# Patient Record
Sex: Male | Born: 1940
Health system: Southern US, Community
[De-identification: ages and names within clinical notes are randomized; demographics above are authoritative.]

## PROBLEM LIST (undated history)

## (undated) DIAGNOSIS — C801 Malignant (primary) neoplasm, unspecified: Secondary | ICD-10-CM

## (undated) DIAGNOSIS — Z87442 Personal history of urinary calculi: Secondary | ICD-10-CM

## (undated) DIAGNOSIS — G620 Drug-induced polyneuropathy: Secondary | ICD-10-CM

## (undated) DIAGNOSIS — R7303 Prediabetes: Secondary | ICD-10-CM

## (undated) DIAGNOSIS — I10 Essential (primary) hypertension: Secondary | ICD-10-CM

## (undated) DIAGNOSIS — T451X5A Adverse effect of antineoplastic and immunosuppressive drugs, initial encounter: Secondary | ICD-10-CM

## (undated) HISTORY — PX: HEMICOLECTOMY: SHX854

## (undated) HISTORY — PX: COLONOSCOPY: SHX174

## (undated) HISTORY — DX: Adverse effect of antineoplastic and immunosuppressive drugs, initial encounter: G62.0

## (undated) HISTORY — DX: Essential (primary) hypertension: I10

## (undated) HISTORY — DX: Drug-induced polyneuropathy: T45.1X5A

## (undated) HISTORY — PX: CATARACT EXTRACTION, BILATERAL: SHX1313

---

## 2002-05-06 HISTORY — PX: COLON SURGERY: SHX602

## 2002-05-13 ENCOUNTER — Encounter: Payer: Self-pay | Admitting: Family Medicine

## 2002-05-13 ENCOUNTER — Ambulatory Visit (HOSPITAL_COMMUNITY): Admission: RE | Admit: 2002-05-13 | Discharge: 2002-05-13 | Payer: Self-pay | Admitting: Family Medicine

## 2003-01-20 ENCOUNTER — Encounter: Payer: Self-pay | Admitting: *Deleted

## 2003-01-20 ENCOUNTER — Encounter: Admission: RE | Admit: 2003-01-20 | Discharge: 2003-01-20 | Payer: Self-pay | Admitting: *Deleted

## 2003-01-24 ENCOUNTER — Encounter: Admission: RE | Admit: 2003-01-24 | Discharge: 2003-01-30 | Payer: Self-pay | Admitting: General Surgery

## 2003-01-24 ENCOUNTER — Encounter: Payer: Self-pay | Admitting: General Surgery

## 2003-01-26 ENCOUNTER — Encounter (INDEPENDENT_AMBULATORY_CARE_PROVIDER_SITE_OTHER): Payer: Self-pay | Admitting: Specialist

## 2003-01-26 ENCOUNTER — Inpatient Hospital Stay (HOSPITAL_COMMUNITY): Admission: RE | Admit: 2003-01-26 | Discharge: 2003-01-29 | Payer: Self-pay | Admitting: General Surgery

## 2003-02-22 ENCOUNTER — Encounter: Payer: Self-pay | Admitting: Surgery

## 2003-02-22 ENCOUNTER — Ambulatory Visit (HOSPITAL_BASED_OUTPATIENT_CLINIC_OR_DEPARTMENT_OTHER): Admission: RE | Admit: 2003-02-22 | Discharge: 2003-02-22 | Payer: Self-pay | Admitting: Surgery

## 2003-02-24 ENCOUNTER — Ambulatory Visit (HOSPITAL_COMMUNITY): Admission: RE | Admit: 2003-02-24 | Discharge: 2003-02-24 | Payer: Self-pay | Admitting: Hematology & Oncology

## 2003-02-24 ENCOUNTER — Encounter: Payer: Self-pay | Admitting: Hematology & Oncology

## 2003-03-22 ENCOUNTER — Ambulatory Visit (HOSPITAL_COMMUNITY): Admission: RE | Admit: 2003-03-22 | Discharge: 2003-03-22 | Payer: Self-pay | Admitting: Hematology & Oncology

## 2003-05-07 HISTORY — PX: HERNIA REPAIR: SHX51

## 2003-06-13 ENCOUNTER — Ambulatory Visit: Admission: RE | Admit: 2003-06-13 | Discharge: 2003-06-13 | Payer: Self-pay | Admitting: Hematology & Oncology

## 2003-07-22 ENCOUNTER — Ambulatory Visit: Admission: RE | Admit: 2003-07-22 | Discharge: 2003-07-22 | Payer: Self-pay | Admitting: Hematology & Oncology

## 2003-08-23 ENCOUNTER — Ambulatory Visit (HOSPITAL_COMMUNITY): Admission: RE | Admit: 2003-08-23 | Discharge: 2003-08-23 | Payer: Self-pay | Admitting: Hematology & Oncology

## 2003-09-12 ENCOUNTER — Ambulatory Visit (HOSPITAL_BASED_OUTPATIENT_CLINIC_OR_DEPARTMENT_OTHER): Admission: RE | Admit: 2003-09-12 | Discharge: 2003-09-12 | Payer: Self-pay | Admitting: General Surgery

## 2003-11-15 ENCOUNTER — Ambulatory Visit (HOSPITAL_COMMUNITY): Admission: RE | Admit: 2003-11-15 | Discharge: 2003-11-15 | Payer: Self-pay | Admitting: Hematology & Oncology

## 2003-12-14 ENCOUNTER — Observation Stay (HOSPITAL_COMMUNITY): Admission: RE | Admit: 2003-12-14 | Discharge: 2003-12-15 | Payer: Self-pay | Admitting: General Surgery

## 2004-02-02 ENCOUNTER — Ambulatory Visit (HOSPITAL_COMMUNITY): Admission: RE | Admit: 2004-02-02 | Discharge: 2004-02-02 | Payer: Self-pay | Admitting: Hematology & Oncology

## 2004-03-14 ENCOUNTER — Ambulatory Visit: Payer: Self-pay | Admitting: Hematology & Oncology

## 2004-05-01 ENCOUNTER — Ambulatory Visit (HOSPITAL_COMMUNITY): Admission: RE | Admit: 2004-05-01 | Discharge: 2004-05-01 | Payer: Self-pay | Admitting: Hematology & Oncology

## 2004-05-16 ENCOUNTER — Ambulatory Visit: Payer: Self-pay | Admitting: Hematology & Oncology

## 2004-08-31 ENCOUNTER — Ambulatory Visit: Payer: Self-pay | Admitting: Hematology & Oncology

## 2004-09-05 ENCOUNTER — Ambulatory Visit (HOSPITAL_COMMUNITY): Admission: RE | Admit: 2004-09-05 | Discharge: 2004-09-05 | Payer: Self-pay | Admitting: Hematology & Oncology

## 2004-12-31 ENCOUNTER — Ambulatory Visit: Payer: Self-pay | Admitting: Hematology & Oncology

## 2005-01-04 ENCOUNTER — Ambulatory Visit (HOSPITAL_COMMUNITY): Admission: RE | Admit: 2005-01-04 | Discharge: 2005-01-04 | Payer: Self-pay | Admitting: Hematology & Oncology

## 2005-05-07 ENCOUNTER — Ambulatory Visit: Payer: Self-pay | Admitting: Hematology & Oncology

## 2005-05-10 ENCOUNTER — Ambulatory Visit (HOSPITAL_COMMUNITY): Admission: RE | Admit: 2005-05-10 | Discharge: 2005-05-10 | Payer: Self-pay | Admitting: Hematology & Oncology

## 2005-11-01 ENCOUNTER — Ambulatory Visit: Payer: Self-pay | Admitting: Hematology & Oncology

## 2005-11-11 ENCOUNTER — Ambulatory Visit (HOSPITAL_COMMUNITY): Admission: RE | Admit: 2005-11-11 | Discharge: 2005-11-11 | Payer: Self-pay | Admitting: Hematology & Oncology

## 2005-11-11 LAB — CBC WITH DIFFERENTIAL/PLATELET
HCT: 44.1 % (ref 38.7–49.9)
LYMPH%: 29.1 % (ref 14.0–48.0)
MCH: 30.7 pg (ref 28.0–33.4)
NEUT#: 3.4 10*3/uL (ref 1.5–6.5)
Platelets: 298 10*3/uL (ref 145–400)
RBC: 5.01 10*6/uL (ref 4.20–5.71)

## 2005-11-11 LAB — COMPREHENSIVE METABOLIC PANEL
ALT: 32 U/L (ref 0–40)
AST: 29 U/L (ref 0–37)
Albumin: 4.1 g/dL (ref 3.5–5.2)
Alkaline Phosphatase: 72 U/L (ref 39–117)
Potassium: 4 mEq/L (ref 3.5–5.3)
Total Bilirubin: 0.9 mg/dL (ref 0.3–1.2)

## 2005-11-11 LAB — CEA: CEA: 1.4 ng/mL (ref 0.0–5.0)

## 2006-04-24 ENCOUNTER — Ambulatory Visit: Payer: Self-pay | Admitting: Hematology & Oncology

## 2006-05-08 ENCOUNTER — Ambulatory Visit (HOSPITAL_COMMUNITY): Admission: RE | Admit: 2006-05-08 | Discharge: 2006-05-08 | Payer: Self-pay | Admitting: Hematology & Oncology

## 2006-05-08 LAB — COMPREHENSIVE METABOLIC PANEL
AST: 31 U/L (ref 0–37)
Calcium: 9.6 mg/dL (ref 8.4–10.5)
Creatinine, Ser: 0.92 mg/dL (ref 0.40–1.50)
Potassium: 3.9 mEq/L (ref 3.5–5.3)
Sodium: 140 mEq/L (ref 135–145)
Total Bilirubin: 0.8 mg/dL (ref 0.3–1.2)

## 2006-05-08 LAB — CBC WITH DIFFERENTIAL/PLATELET
Basophils Absolute: 0 10*3/uL (ref 0.0–0.1)
HCT: 45.1 % (ref 38.7–49.9)
HGB: 15.3 g/dL (ref 13.0–17.1)
LYMPH%: 35.3 % (ref 14.0–48.0)
MCH: 30.3 pg (ref 28.0–33.4)
MONO%: 8.5 % (ref 0.0–13.0)
NEUT%: 53.3 % (ref 40.0–75.0)
Platelets: 329 10*3/uL (ref 145–400)
RBC: 5.05 10*6/uL (ref 4.20–5.71)
RDW: 12.4 % (ref 11.2–14.6)
WBC: 7.3 10*3/uL (ref 4.0–10.0)

## 2006-05-08 LAB — PSA: PSA: 0.79 ng/mL (ref 0.10–4.00)

## 2006-05-08 IMAGING — CT CT ABDOMEN W/ CM
2 of 6 series · 17 of 46 positions shown, 19 images · IV contrast (omnipaque)
Comparison: [DATE].

CLINICAL DATA: Colon cancer.
 CHEST CT WITH CONTRAST:
TECHNIQUE: Multidetector CT imaging of the chest was performed following the standard protocol during bolus administration of intravenous contrast.
 Contrast:  125 cc Omnipaque 300.
TECHNIQUE: Multidetector CT imaging of the abdomen was performed following the standard protocol during bolus administration of intravenous contrast.
TECHNIQUE: Multidetector CT imaging of the pelvis was performed following the standard protocol during bolus administration of intravenous contrast.

[Series 2: cap 5.0 b40f · axial · 0.80mm/px · z∈[-667,-92]mm · 14 of 133 slices shown, 16 images]
[im 9/133  soft-tissue]
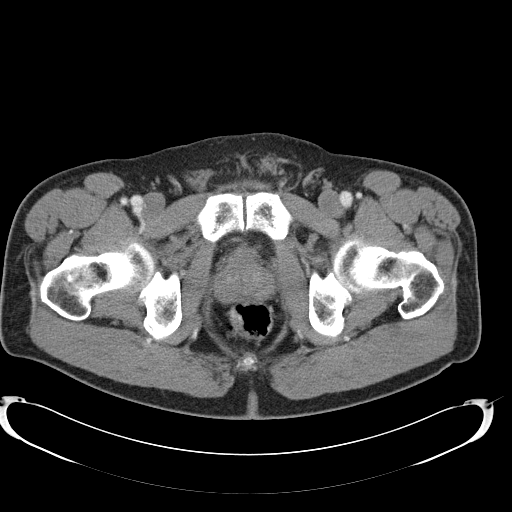
[im 9/133  bone]
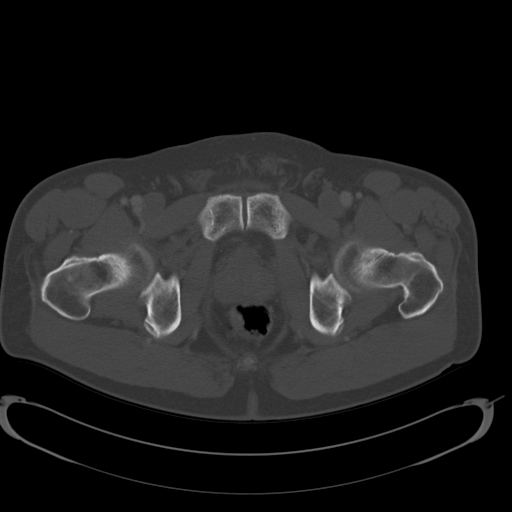
[im 17/133  soft-tissue]
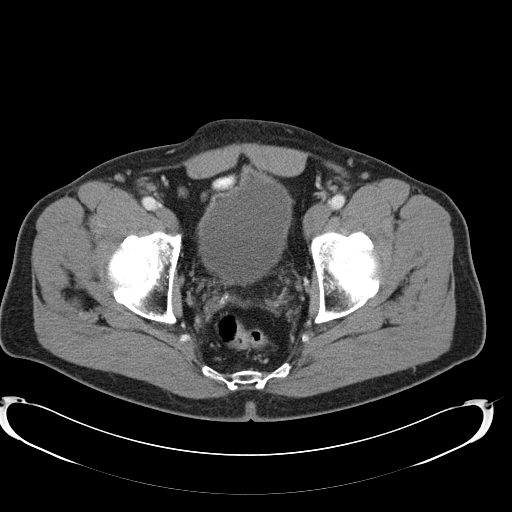
[im 25/133  soft-tissue]
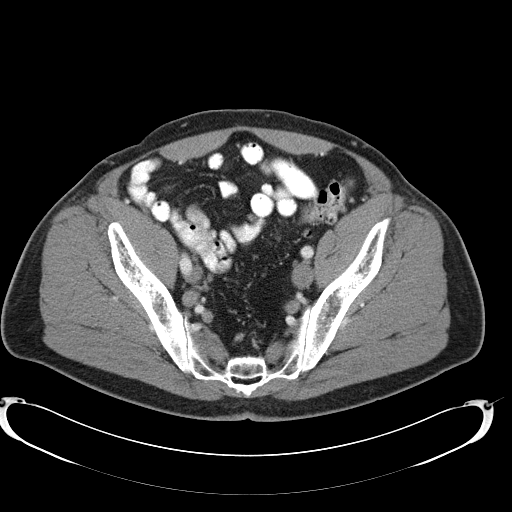
[im 34/133  soft-tissue]
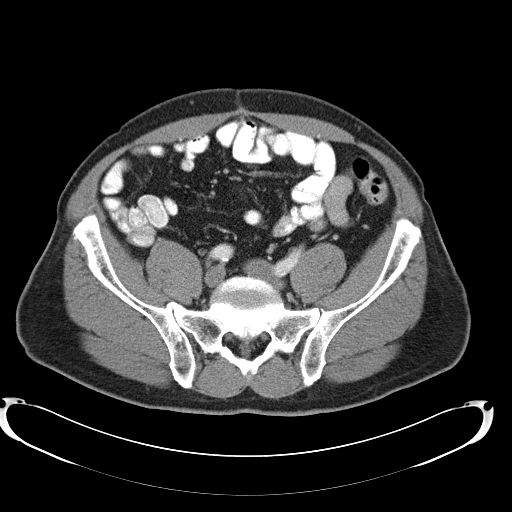
[im 42/133  soft-tissue]
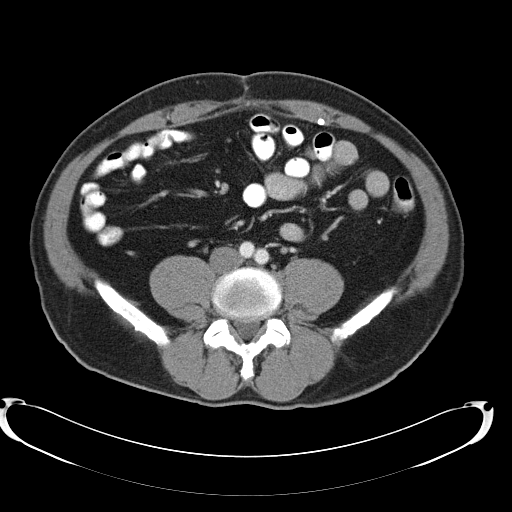
[im 50/133  soft-tissue]
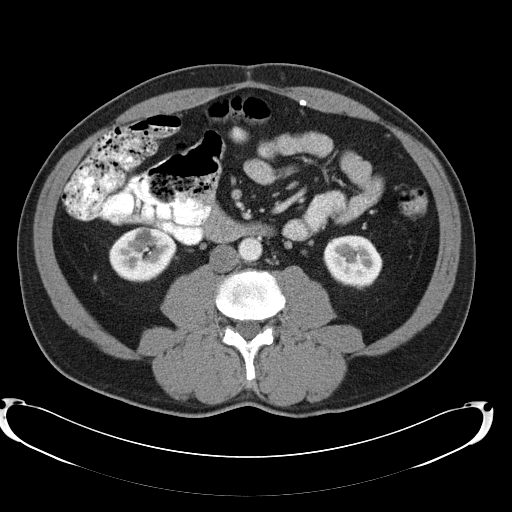
[im 58/133  soft-tissue]
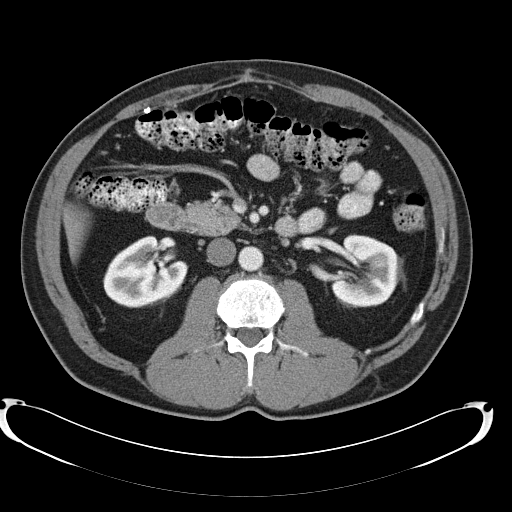
[im 75/133  soft-tissue]
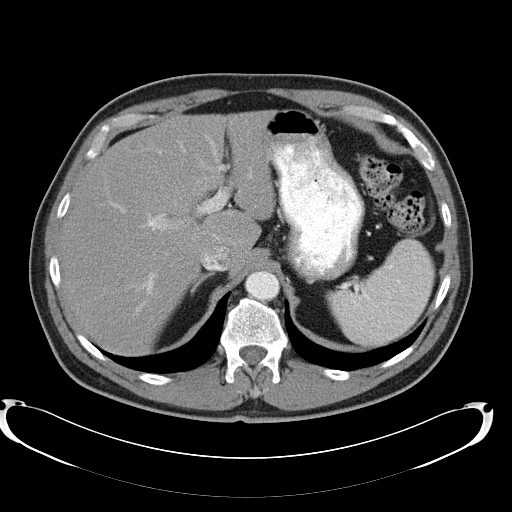
[im 83/133  soft-tissue]
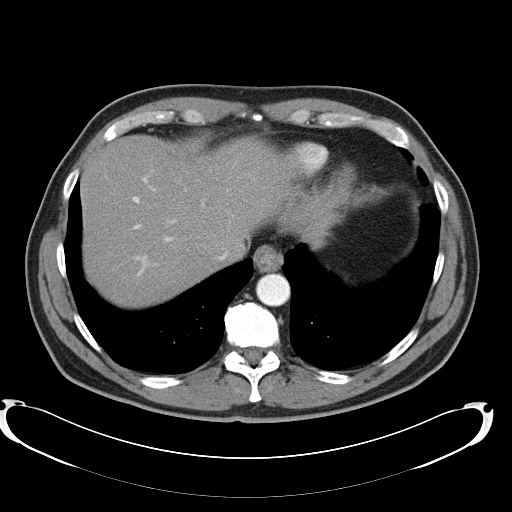
[im 83/133  bone]
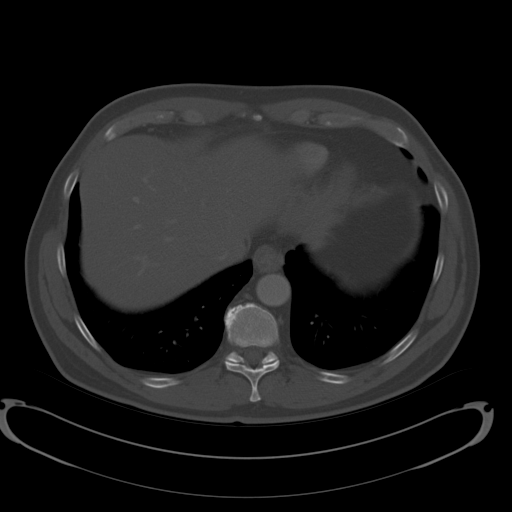
[im 91/133  soft-tissue]
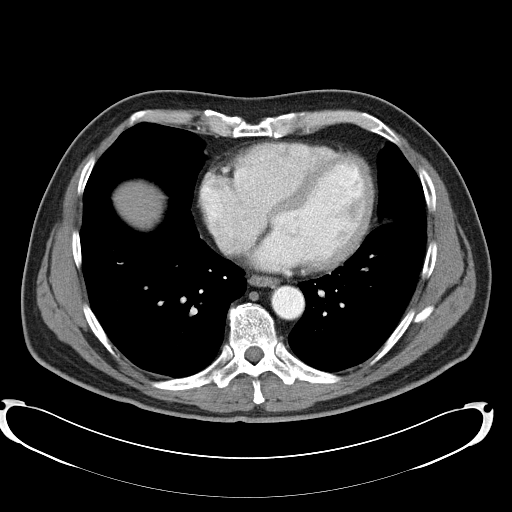
[im 100/133  soft-tissue]
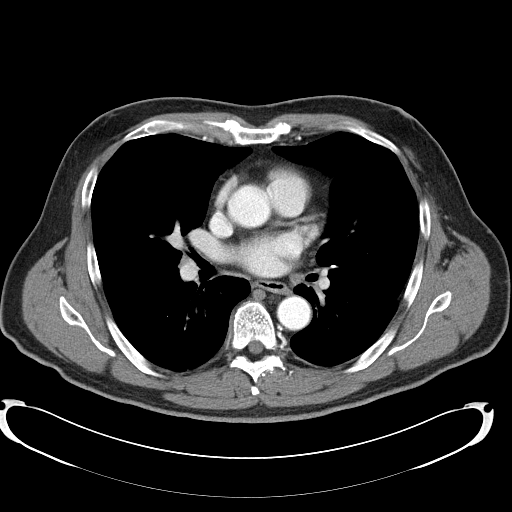
[im 108/133  soft-tissue]
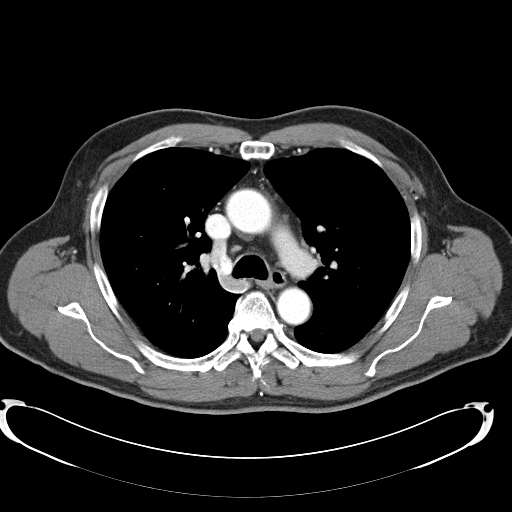
[im 116/133  soft-tissue]
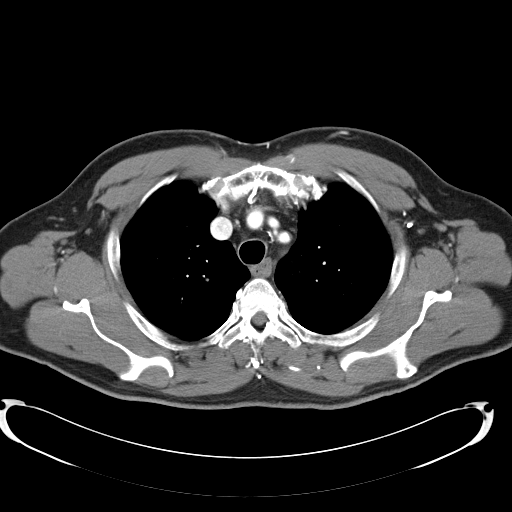
[im 124/133  soft-tissue]
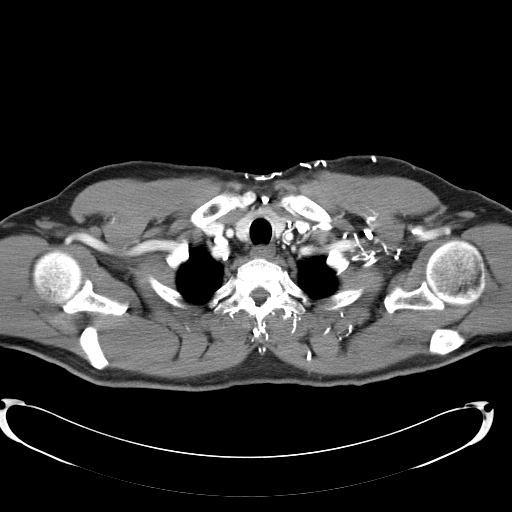

[Series 602: coronal · coronal · 1.30mm/px · 3 of 85 slices shown]
[im 29/85  soft-tissue]
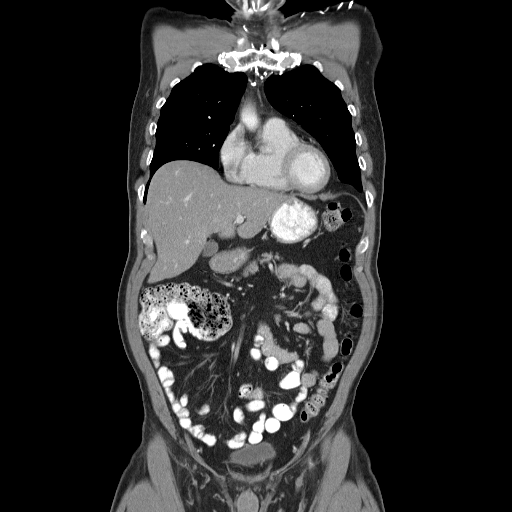
[im 38/85  soft-tissue]
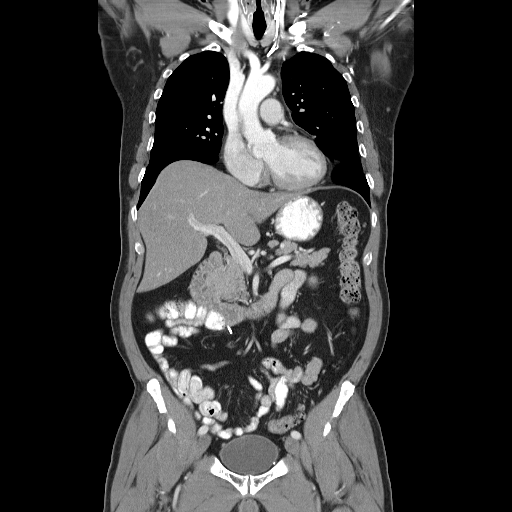
[im 47/85  soft-tissue]
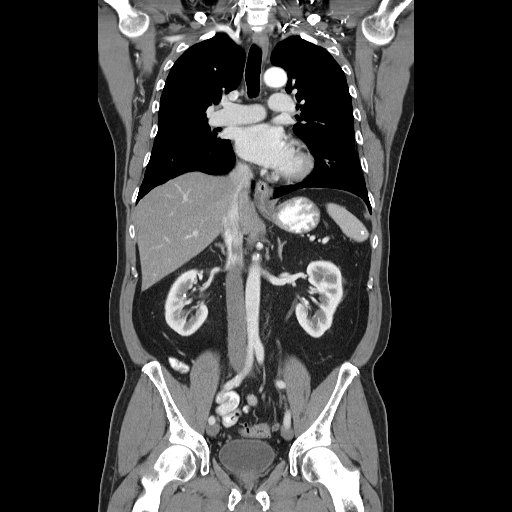

[17 of 46 positions shown; findings below may reference images not displayed]

FINDINGS: The left innominate vein is chronically occluded.  Negative abnormal mediastinal adenopathy.  Small hilar nodes are unchanged.  Negative pericardial effusion.
 No pneumothoraces or effusions are seen. 
 A 5 mm right middle lobe nodule on image #31 is stable.  No new pulmonary nodules are seen.
IMPRESSION: Stable exam.  Stable right middle lobe nodule.  Stable left innominate vein occlusion.
 ABDOMEN CT WITH CONTRAST:
FINDINGS: Diffuse fatty infiltration of the liver is stable.  Calcified granulomata in the spleen are stable.  The gallbladder, adrenal glands and pancreas are unremarkable.  Nephrolithiasis is stable.  Renal hypodensities are stable.  Negative free fluid or abnormal adenopathy.  Postoperative changes are noted.  There is an apparent significant stenosis at the origin of the superior mesenteric artery.  This is a stable finding.
IMPRESSION: No evidence of metastatic disease.  Stable exam.  Stenosis at the origin of the superior mesenteric artery is suggested.
 PELVIS CT WITH CONTRAST:
FINDINGS: Sigmoid diverticulosis without diverticulitis is stable.  Negative free fluid or abnormal adenopathy.  The bladder and prostate are within normal limits.
IMPRESSION: No evidence of metastatic disease.  Stable diverticulosis of the sigmoid colon.

## 2006-09-11 ENCOUNTER — Ambulatory Visit: Admission: RE | Admit: 2006-09-11 | Discharge: 2006-09-11 | Payer: Self-pay | Admitting: Hematology & Oncology

## 2006-09-11 ENCOUNTER — Encounter: Payer: Self-pay | Admitting: Hematology & Oncology

## 2006-11-12 ENCOUNTER — Ambulatory Visit: Payer: Self-pay | Admitting: Hematology & Oncology

## 2006-11-14 ENCOUNTER — Ambulatory Visit (HOSPITAL_COMMUNITY): Admission: RE | Admit: 2006-11-14 | Discharge: 2006-11-14 | Payer: Self-pay | Admitting: Hematology & Oncology

## 2006-11-14 LAB — CBC WITH DIFFERENTIAL/PLATELET
BASO%: 1.5 % (ref 0.0–2.0)
Basophils Absolute: 0.1 10*3/uL (ref 0.0–0.1)
Eosinophils Absolute: 0.2 10*3/uL (ref 0.0–0.5)
HCT: 42.5 % (ref 38.7–49.9)
MCH: 30.8 pg (ref 28.0–33.4)
MCHC: 35.9 g/dL (ref 32.0–35.9)
MCV: 85.7 fL (ref 81.6–98.0)
MONO#: 0.7 10*3/uL (ref 0.1–0.9)
MONO%: 10.9 % (ref 0.0–13.0)
NEUT#: 3.6 10*3/uL (ref 1.5–6.5)
Platelets: 321 10*3/uL (ref 145–400)
RBC: 4.96 10*6/uL (ref 4.20–5.71)
RDW: 10.5 % — ABNORMAL LOW (ref 11.2–14.6)
lymph#: 2.1 10*3/uL (ref 0.9–3.3)

## 2006-11-14 LAB — COMPREHENSIVE METABOLIC PANEL
AST: 31 U/L (ref 0–37)
Alkaline Phosphatase: 71 U/L (ref 39–117)
BUN: 16 mg/dL (ref 6–23)
CO2: 29 mEq/L (ref 19–32)
Creatinine, Ser: 1.02 mg/dL (ref 0.40–1.50)
Glucose, Bld: 116 mg/dL — ABNORMAL HIGH (ref 70–99)
Sodium: 140 mEq/L (ref 135–145)
Total Bilirubin: 0.7 mg/dL (ref 0.3–1.2)

## 2006-11-14 LAB — CEA: CEA: 1.7 ng/mL (ref 0.0–5.0)

## 2006-12-28 ENCOUNTER — Ambulatory Visit: Payer: Self-pay | Admitting: Hematology & Oncology

## 2007-05-19 ENCOUNTER — Ambulatory Visit: Payer: Self-pay | Admitting: Hematology & Oncology

## 2007-05-21 ENCOUNTER — Ambulatory Visit (HOSPITAL_COMMUNITY): Admission: RE | Admit: 2007-05-21 | Discharge: 2007-05-21 | Payer: Self-pay | Admitting: Hematology & Oncology

## 2007-05-21 LAB — COMPREHENSIVE METABOLIC PANEL
ALT: 27 U/L (ref 0–53)
AST: 26 U/L (ref 0–37)
Albumin: 4 g/dL (ref 3.5–5.2)
Alkaline Phosphatase: 71 U/L (ref 39–117)
BUN: 19 mg/dL (ref 6–23)
CO2: 30 mEq/L (ref 19–32)
Calcium: 9.3 mg/dL (ref 8.4–10.5)
Chloride: 101 mEq/L (ref 96–112)
Creatinine, Ser: 0.94 mg/dL (ref 0.40–1.50)
Glucose, Bld: 112 mg/dL — ABNORMAL HIGH (ref 70–99)
Potassium: 4 mEq/L (ref 3.5–5.3)
Sodium: 137 mEq/L (ref 135–145)
Total Bilirubin: 0.9 mg/dL (ref 0.3–1.2)
Total Protein: 6.5 g/dL (ref 6.0–8.3)

## 2007-05-21 LAB — CBC WITH DIFFERENTIAL/PLATELET
BASO%: 0.5 % (ref 0.0–2.0)
Basophils Absolute: 0 10*3/uL (ref 0.0–0.1)
EOS%: 2.8 % (ref 0.0–7.0)
Eosinophils Absolute: 0.2 10*3/uL (ref 0.0–0.5)
HCT: 44.3 % (ref 38.7–49.9)
HGB: 15.7 g/dL (ref 13.0–17.1)
LYMPH%: 30.2 % (ref 14.0–48.0)
MCH: 31.6 pg (ref 28.0–33.4)
MCHC: 35.6 g/dL (ref 32.0–35.9)
MCV: 88.9 fL (ref 81.6–98.0)
MONO#: 0.8 10*3/uL (ref 0.1–0.9)
MONO%: 12 % (ref 0.0–13.0)
NEUT#: 3.6 10*3/uL (ref 1.5–6.5)
NEUT%: 54.5 % (ref 40.0–75.0)
Platelets: 304 10*3/uL (ref 145–400)
RBC: 4.98 10*6/uL (ref 4.20–5.71)
RDW: 12.3 % (ref 11.2–14.6)
WBC: 6.7 10*3/uL (ref 4.0–10.0)
lymph#: 2 10*3/uL (ref 0.9–3.3)

## 2007-05-21 LAB — CEA: CEA: 1 ng/mL (ref 0.0–5.0)

## 2007-11-17 ENCOUNTER — Ambulatory Visit: Payer: Self-pay | Admitting: Hematology & Oncology

## 2007-11-19 ENCOUNTER — Ambulatory Visit (HOSPITAL_COMMUNITY): Admission: RE | Admit: 2007-11-19 | Discharge: 2007-11-19 | Payer: Self-pay | Admitting: Hematology & Oncology

## 2007-11-19 LAB — COMPREHENSIVE METABOLIC PANEL
ALT: 24 U/L (ref 0–53)
Albumin: 3.8 g/dL (ref 3.5–5.2)
BUN: 10 mg/dL (ref 6–23)
Calcium: 9.3 mg/dL (ref 8.4–10.5)
Glucose, Bld: 113 mg/dL — ABNORMAL HIGH (ref 70–99)
Potassium: 3.8 mEq/L (ref 3.5–5.3)
Sodium: 138 mEq/L (ref 135–145)
Total Bilirubin: 0.8 mg/dL (ref 0.3–1.2)
Total Protein: 6.9 g/dL (ref 6.0–8.3)

## 2007-11-19 LAB — CBC WITH DIFFERENTIAL/PLATELET
Basophils Absolute: 0 10*3/uL (ref 0.0–0.1)
EOS%: 4 % (ref 0.0–7.0)
Eosinophils Absolute: 0.3 10*3/uL (ref 0.0–0.5)
HGB: 14.8 g/dL (ref 13.0–17.1)
LYMPH%: 31.2 % (ref 14.0–48.0)
MCH: 30.8 pg (ref 28.0–33.4)
MONO%: 10 % (ref 0.0–13.0)
NEUT#: 3.6 10*3/uL (ref 1.5–6.5)
NEUT%: 54.1 % (ref 40.0–75.0)
Platelets: 306 10*3/uL (ref 145–400)
RBC: 4.81 10*6/uL (ref 4.20–5.71)
RDW: 12.1 % (ref 11.2–14.6)
WBC: 6.6 10*3/uL (ref 4.0–10.0)
lymph#: 2.1 10*3/uL (ref 0.9–3.3)

## 2007-11-19 LAB — CEA: CEA: 1.9 ng/mL (ref 0.0–5.0)

## 2007-11-26 ENCOUNTER — Ambulatory Visit: Payer: Self-pay | Admitting: Hematology & Oncology

## 2010-05-26 ENCOUNTER — Encounter: Payer: Self-pay | Admitting: Hematology & Oncology

## 2010-05-27 ENCOUNTER — Encounter: Payer: Self-pay | Admitting: Hematology & Oncology

## 2010-09-21 NOTE — Discharge Summary (Signed)
NAME:  Jared [name], Jared [name]                            ACCOUNT NO.:  [id number]   MEDICAL RECORD NO.:  [id number]                   PATIENT TYPE:  INP   LOCATION:  0446                                 FACILITY:  [facility]   PHYSICIAN:  Sharlet Salina T. Hoxworth, M.D.          DATE OF BIRTH:  1941/03/09   DATE OF ADMISSION:  01/26/2003  DATE OF DISCHARGE:  01/29/2003                                 DISCHARGE SUMMARY   DISCHARGE DIAGNOSIS:  Adenocarcinoma of the right colon.   PROCEDURE:  Right hemicolectomy January 26, 2003.   HISTORY OF PRESENT ILLNESS:  [name] is a 70 year old white male who  presented with anemia.  Colonoscopy has revealed a typical appearing  carcinoma in the right colon.  Preoperative work-up does not show any  evidence of metastatic disease.  Following a mechanical antibiotic bowel  prep at home, he is admitted for elective colectomy.   PAST MEDICAL HISTORY:  Generally healthy.  He has a history of kidney stone.   PAST SURGICAL HISTORY:  The only surgery is tonsillectomy.   MEDICATIONS:  He takes Protonix for mild GERD.   ALLERGIES:  No known drug allergies.   For social history, family history, and review of systems see detailed H&P.   PERTINENT PHYSICAL EXAMINATION:  A generally healthy-appearing white male in  no distress.  Physical examination was entirely unremarkable.   LABORATORY DATA:  Hemoglobin 12.8.  CA mildly elevated at 5.3.   CT scan of the abdomen showed a 6 to 7 cm mass in the cecum with a couple of  minimally enlarged adjacent lymph nodes and no evidence of distal metastatic  disease.   HOSPITAL COURSE:  The patient was admitted on the morning of his procedure  and underwent an uneventful right hemicolectomy.  His postoperative course  was very smooth.  He was started on a clear liquid diet on the first  postoperative day which he tolerated well and his diet was able to be  steadily advanced.  Postoperative hemoglobin was 13 on the second  postoperative day.  By the third postoperative day, January 29, 2003, he  had had a bowel movement and was tolerating a regular diet.  The abdomen was  soft and nontender.  Wound was healing primarily.  Pathology revealed stage  III carcinoma with one of 19 lymph nodes positive.  Oncology follow-up will  be arranged as an outpatient.    DISCHARGE MEDICATION:  Tylox for pain.   FOLLOW UP:  This is to be in my office in two weeks.                                               Lorne Skeens. Hoxworth, M.D.    [name]  D:  02/14/2003  T:  02/14/2003  Job:  [id number]   cc:   Dr. Tyna Jaksch. Luther Parody, M.D.  [address], [address]  [place]  [place] [address]  Fax: [phone]

## 2010-09-21 NOTE — Op Note (Signed)
NAME:  Jared [name], Jared [name]                            ACCOUNT NO.:  [id number]   MEDICAL RECORD NO.:  [id number]                   PATIENT TYPE:  OBV   LOCATION:  0478                                 FACILITY:  [facility]   PHYSICIAN:  Sharlet Salina T. Hoxworth, M.D.          DATE OF BIRTH:  Dec 13, 1940   DATE OF PROCEDURE:  12/14/2003  DATE OF DISCHARGE:                                 OPERATIVE REPORT   PREOPERATIVE DIAGNOSIS:  Ventral incisional hernia.   POSTOPERATIVE DIAGNOSIS:  Ventral incisional hernia.   SURGICAL PROCEDURE:  Laparoscopic repair of ventral incisional hernia.   SURGEON:  Dr. Johna Sheriff   ANESTHESIA:  General.   BRIEF HISTORY:  [name] is a 70 year old male, status post partial  colectomy for carcinoma over a year ago.  He now has a symptomatic midline  ventral incisional hernia, probably 3-4 cm in diameter just above the  umbilicus.  Options have been discussed, and we elected to proceed with  laparoscopic repair.  The nature of the procedure, indications, risks of  bleeding, infection, and recurrence were discussed and understood.  The  patient is now brought to the operating room for this procedure.   DESCRIPTION OF OPERATION:  The patient brought to the operating room, placed  in supine position on the operating table, and general endotracheal  anesthesia was induced.  PAS were placed.  He was given preoperative  antibiotics.  The abdomen was widely sterilely prepped and draped.  Trocar  sites were infiltrated with Marcaine prior to the incision.  The abdomen was  accessed through a 1 cm incision in the left flank with an open Hasson  technique under direct vision without difficulty through a mattress suture  of 0 Vicryl and pneumoperitoneum established.  There were fairly extensive  adhesions of omentum to the midline and up into the hernia defect with no  significant bowel adhesions.  Under direct vision, I placed one 5 mm trocar  in the right flank and two 5 mm  trocars in the left flank.  Using the  harmonic scalpel, omental adhesions were all completely taken down from the  anterior abdominal wall without difficulty.  The hernia defect was fairly  discrete, defined about 4 cm in diameter above the umbilicus in the midline.  I took down the falciform ligament to allow broader coverage of the mesh.  I  chose a 20 x 15 cm piece of Parietex dual mesh to broadly cover the entire  midline incision.  Four 0 Prolene sutures were placed at the four corners,  and the mesh was moistened, rolled, and placed in the abdominal cavity,  unfurled and oriented.  Through four small stab incisions, the sutures were  grasped intra-abdominally and brought up the abdominal wall and tied with  excellent unfurling of the mesh.  The mesh was then tacked at its periphery  circumferentially.  This provided very broad coverage of the hernia defect  and __________ of the mesh.  The abdomen was inspected for hemostasis which  appeared complete.  Trocars were removed under and all CO2 evacuated.  The  mattress sutures was secured in the left flank.  Skin incisions were closed  with interrupted subcuticular 4-0 Monocryl and Steri-Strips.  Sponge,  needle, and instrument counts were correct.  Dry sterile dressings were  applied and the patient taken to the recovery room in good condition.                                              Lorne Skeens. Hoxworth, M.D.   [name]  D:  12/14/2003  T:  12/14/2003  Job:  [id number]

## 2011-04-19 ENCOUNTER — Other Ambulatory Visit: Payer: Self-pay | Admitting: Gastroenterology

## 2013-09-06 ENCOUNTER — Other Ambulatory Visit: Payer: Self-pay | Admitting: Gastroenterology

## 2014-02-27 ENCOUNTER — Emergency Department (INDEPENDENT_AMBULATORY_CARE_PROVIDER_SITE_OTHER): Payer: Medicare PPO

## 2014-02-27 ENCOUNTER — Emergency Department (INDEPENDENT_AMBULATORY_CARE_PROVIDER_SITE_OTHER)
Admission: EM | Admit: 2014-02-27 | Discharge: 2014-02-27 | Disposition: A | Discharge status: Home / Self Care | Payer: Medicare PPO | Source: Home / Self Care | Attending: Emergency Medicine | Admitting: Emergency Medicine

## 2014-02-27 ENCOUNTER — Encounter (HOSPITAL_COMMUNITY): Payer: Self-pay | Admitting: Emergency Medicine

## 2014-02-27 DIAGNOSIS — T148 Other injury of unspecified body region: Secondary | ICD-10-CM

## 2014-02-27 DIAGNOSIS — IMO0002 Reserved for concepts with insufficient information to code with codable children: Secondary | ICD-10-CM

## 2014-02-27 DIAGNOSIS — Z23 Encounter for immunization: Secondary | ICD-10-CM

## 2014-02-27 DIAGNOSIS — T148XXA Other injury of unspecified body region, initial encounter: Secondary | ICD-10-CM

## 2014-02-27 HISTORY — DX: Malignant (primary) neoplasm, unspecified: C80.1

## 2014-02-27 MED ORDER — TETANUS-DIPHTH-ACELL PERTUSSIS 5-2.5-18.5 LF-MCG/0.5 IM SUSP
0.5000 mL | Freq: Once | INTRAMUSCULAR | Status: AC
  Administered 2014-02-27: 0.5 mL via INTRAMUSCULAR

## 2014-02-27 MED ORDER — LIDOCAINE HCL (PF) 2 % IJ SOLN
INTRAMUSCULAR | Status: AC
  Filled 2014-02-27: qty 2

## 2014-02-27 MED ORDER — CEPHALEXIN 500 MG PO CAPS: 500.0000 mg | ORAL_CAPSULE | Freq: Three times a day (TID) | ORAL | Status: DC

## 2014-02-27 MED ORDER — HYDROCODONE-ACETAMINOPHEN 5-325 MG PO TABS: ORAL_TABLET | ORAL | Status: DC

## 2014-02-27 MED ORDER — BACITRACIN ZINC 500 UNIT/GM EX OINT
TOPICAL_OINTMENT | CUTANEOUS | Status: AC
  Filled 2014-02-27: qty 0.9

## 2014-02-27 MED ORDER — TETANUS-DIPHTH-ACELL PERTUSSIS 5-2.5-18.5 LF-MCG/0.5 IM SUSP
INTRAMUSCULAR | Status: AC
  Filled 2014-02-27: qty 0.5

## 2014-02-27 NOTE — ED Provider Notes (Signed)
Chief Complaint   Extremity Laceration   History of Present Illness   Jared [name] is a 73 year old male who lacerated the tip of his right little finger yesterday, about 24 hours ago, loading a pellet rifle. The tip of the finger was pinched by the loading mechanism. He has a flap laceration. He was able to get the bleeding stopped. He took a couple of drinks of liquor and used cayenne pepper on the laceration. He denies any numbness or tingling. He is able to move all joints well. He cannot remember when his last tetanus shot was.  Review of Systems   Other than as noted above, the patient denies any of the following symptoms: Musculoskeletal:  No joint pain or decreased range of motion. Neuro:  No numbness, tingling, or weakness.  [place]   Past medical history, family history, social history, meds, and allergies were reviewed.   Physical Examination     Vital signs:  BP 157/96  Pulse 67  Temp(Src) 98.1 F (36.7 C) (Oral)  Resp 16  SpO2 96% Ext:  There is a 1 cm flap laceration of the tip of the finger. It looks somewhat dark in color.  All other joints had a full ROM without pain.  Pulses were full.  Good capillary refill in all digits.  No edema. Neurological:  Alert and oriented.  No muscle weakness.  Sensation was intact to light touch.      Procedure Note:  Verbal informed consent was obtained.  The patient was informed of the risks and benefits of the procedure and understands and accepts.  A time out was called and the identity of the patient and correct procedure were confirmed.   The laceration area described above was prepped with Betadine and saline  and anesthetized with a digital block with 5 mL of 2% Xylocaine without epinephrine.  The wound was then closed as follows:  Necrotic tissue was snipped off, then the flap laceration was tacked down with 10 5-0 nylon sutures.  There were no immediate complications, and the patient tolerated the procedure well. The  laceration was then cleansed, Bacitracin ointment was applied and a clean, dry pressure dressing was put on.       Course in [facility] [place]   He was given a Tdap vaccine.  Assessment   The primary encounter diagnosis was Crush injury. A diagnosis of Laceration was also pertinent to this visit.  This has been open for about 24 hours, therefore it is at high risk of infection. I was reluctant to not close it. I suggest he return again in 3 days for wound recheck and was placed on cephalexin.  Plan   1.  Meds:  The following meds were prescribed:   Discharge Medication List as of 02/27/2014  3:46 PM    START taking these medications   Details  cephALEXin (KEFLEX) 500 MG capsule Take 1 capsule (500 mg total) by mouth 3 (three) times daily., Starting 02/27/2014, Until Discontinued, Normal    HYDROcodone-acetaminophen (NORCO/VICODIN) 5-325 MG per tablet 1 to 2 tabs every 4 to 6 hours as needed for pain., Print        2.  Patient Education/Counseling:  The patient was given appropriate handouts, self care instructions, and instructed in symptomatic relief. Instructions were given for wound care.    3.  Follow up:  The patient was told to follow up immediately if there is any sign of infection.The patient will return in 3 days for a recheck  and 14 days for suture removal.      Harden Mo, MD 02/27/14 [phone]

## 2014-02-27 NOTE — Discharge Instructions (Signed)

## 2014-02-27 NOTE — ED Notes (Signed)
Was cocking and air rifle @ 1430 yesterday.  Finger got caught between the barrel and the body of the gun.  Laceration to R small finger.  Bleeding stopped.

## 2014-03-02 ENCOUNTER — Emergency Department (INDEPENDENT_AMBULATORY_CARE_PROVIDER_SITE_OTHER)
Admission: EM | Admit: 2014-03-02 | Discharge: 2014-03-02 | Disposition: A | Discharge status: Home / Self Care | Payer: Medicare PPO | Source: Home / Self Care | Attending: Emergency Medicine | Admitting: Emergency Medicine

## 2014-03-02 ENCOUNTER — Encounter (HOSPITAL_COMMUNITY): Payer: Self-pay | Admitting: Emergency Medicine

## 2014-03-02 DIAGNOSIS — T148 Other injury of unspecified body region: Secondary | ICD-10-CM

## 2014-03-02 DIAGNOSIS — IMO0002 Reserved for concepts with insufficient information to code with codable children: Secondary | ICD-10-CM

## 2014-03-02 LAB — POCT URINALYSIS DIP (DEVICE)
Bilirubin Urine: NEGATIVE
Glucose, UA: NEGATIVE mg/dL
Hgb urine dipstick: NEGATIVE
Ketones, ur: NEGATIVE mg/dL
LEUKOCYTES UA: NEGATIVE
Nitrite: NEGATIVE
Protein, ur: NEGATIVE mg/dL
Specific Gravity, Urine: 1.02 (ref 1.005–1.030)
UROBILINOGEN UA: 0.2 mg/dL (ref 0.0–1.0)
pH: 5.5 (ref 5.0–8.0)

## 2014-03-02 NOTE — Discharge Instructions (Signed)
Wash with soap and water, continue to apply antibiotic ointment.  Finish up antibiotics.  Return in 12 days for suture removal.

## 2014-03-02 NOTE — ED Provider Notes (Signed)
  Chief Complaint   Follow-up   History of Present Illness   Jared [name] is a 73 year old male who returns today for a scheduled follow-up on a finger laceration which was repaired 2 days ago. The patient presented with a flap laceration to his right fifth finger about 24 hours after the incident happened. This was repaired with 10 stitches. He was placed on cephalexin. He's been applying antibiotic ointment since then. There's been no fever or swelling. He denies any pain.  He also mentions that he's had some back pain off and on and wonders if it could be a kidney stone or kidney infection. He denies any dysuria, frequency, urgency, or hematuria.  Review of Systems   Other than as noted above, the patient denies any of the following symptoms: Systemic:  No fevers, chills, sweats, weight loss or gain, fatigue, or tiredness.  [place]   Past medical history, family history, social history, meds, and allergies were reviewed.    Physical Examination    Vital signs:  BP 146/91  Pulse 75  Temp(Src) 98.3 F (36.8 C) (Oral)  Resp 18  SpO2 98% General:  Alert and oriented.  In no distress.  Skin warm and dry. Extremities: The laceration appears to be healing up well. There was a small amount of necrosis at the tip of the flap, but most of the flap looks good and pink. There is slight erythema and minimal swelling. There is minimal pain to palpation. Joints all have full range of motion with no pain.      Labs   Results for orders placed during the hospital encounter of 03/02/14  POCT URINALYSIS DIP (DEVICE)      Result Value Ref Range   Glucose, UA NEGATIVE  NEGATIVE mg/dL   Bilirubin Urine NEGATIVE  NEGATIVE   Ketones, ur NEGATIVE  NEGATIVE mg/dL   Specific Gravity, Urine 1.020  1.005 - 1.030   Hgb urine dipstick NEGATIVE  NEGATIVE   pH 5.5  5.0 - 8.0   Protein, ur NEGATIVE  NEGATIVE mg/dL   Urobilinogen, UA 0.2  0.0 - 1.0 mg/dL   Nitrite NEGATIVE  NEGATIVE   Leukocytes, UA  NEGATIVE  NEGATIVE    Assessment   The encounter diagnosis was Laceration.  Appears to be healing well with a little necrosis at the tip of the flap, but this should heal up fine. There is no evidence of infection.  Plan   1.  Meds:  The following meds were prescribed:   New Prescriptions   No medications on file    2.  Patient Education/Counseling:  The patient was given appropriate handouts, self care instructions, and instructed in symptomatic relief.  Continue present wound care, return again in 12 days for suture removal.  3.  Follow up:  The patient was told to follow up here in 12 days for suture removal, or sooner if becoming worse in any way, and given some red flag symptoms such as any evidence of infection which would prompt immediate return.        Harden Mo, MD 03/02/14 [phone]

## 2014-03-02 NOTE — ED Notes (Signed)
Here for a f/u to laceration to right 5th digit Voices no new concerns Tolerating well antibiotics Alert, no signs of acute distress.

## 2014-03-03 LAB — URINE CULTURE
Colony Count: 8000
Special Requests: NORMAL

## 2014-03-14 ENCOUNTER — Emergency Department (INDEPENDENT_AMBULATORY_CARE_PROVIDER_SITE_OTHER)
Admission: EM | Admit: 2014-03-14 | Discharge: 2014-03-14 | Disposition: A | Discharge status: Home / Self Care | Payer: Medicare PPO | Source: Home / Self Care | Attending: Family Medicine | Admitting: Family Medicine

## 2014-03-14 ENCOUNTER — Encounter (HOSPITAL_COMMUNITY): Payer: Self-pay | Admitting: Emergency Medicine

## 2014-03-14 DIAGNOSIS — Z4802 Encounter for removal of sutures: Secondary | ICD-10-CM

## 2014-03-14 NOTE — Discharge Instructions (Signed)

## 2014-03-14 NOTE — ED Provider Notes (Signed)
CSN: [id number]     Arrival date & time 03/14/14  5784 History   First MD Initiated Contact with Patient 03/14/14 [phone]     Chief Complaint  Patient presents with  . Suture / Staple Removal   (Consider location/radiation/quality/duration/timing/severity/associated sxs/prior Treatment) HPI Comments: Suture removal after repair of laceration to right 5th digit.   Past Medical History  Diagnosis Date  . Cancer     colon   Past Surgical History  Procedure Laterality Date  . Colon surgery  2004    R hemicolectomy - removed due to cancer   Family History  Problem Relation Age of Onset  . Other Mother     old age  . Heart failure Father    History  Substance Use Topics  . Smoking status: Never Smoker   . Smokeless tobacco: Not on file  . Alcohol Use: No    Review of Systems  All other systems reviewed and are negative.   Allergies  Review of patient's allergies indicates no known allergies.  Home Medications   Prior to Admission medications   Medication Sig Start Date End Date Taking? Authorizing Provider  HYDROcodone-acetaminophen (NORCO/VICODIN) 5-325 MG per tablet 1 to 2 tabs every 4 to 6 hours as needed for pain. 02/27/14  Yes Harden Mo, MD  cephALEXin (KEFLEX) 500 MG capsule Take 1 capsule (500 mg total) by mouth 3 (three) times daily. 02/27/14   Harden Mo, MD   BP 123/78 mmHg  Pulse 62  Temp(Src) 98.2 F (36.8 C) (Oral)  SpO2 96% Physical Exam  Constitutional: He is oriented to person, place, and time. He appears well-developed and well-nourished. No distress.  Neurological: He is alert and oriented to person, place, and time. He exhibits normal muscle tone.  Skin: Skin is warm and dry.  Minor erythema/pink adjacent to wound. No drainage. No limitations of ROM.  Nursing note and vitals reviewed.   ED Course  SUTURE REMOVAL Date/Time: 03/14/2014 9:53 AM Performed by: Marcha Dutton, Jonatan Wilsey Authorized by: Lynne Leader, S Consent: Verbal consent  obtained. Risks and benefits: risks, benefits and alternatives were discussed Consent given by: patient Patient understanding: patient states understanding of the procedure being performed Patient identity confirmed: verbally with patient Body area: upper extremity Location details: right small finger Wound Appearance: clean Sutures Removed: 8 Facility: sutures placed in this facility Patient tolerance: Patient tolerated the procedure well with no immediate complications Comments: All sutures removed   (including critical care time) Labs Review Labs Reviewed - No data to display  Imaging Review No results found.   MDM   1. Visit for suture removal    All sutures removed. Keep clean and dry, for signs of infection return.    Janne Napoleon, NP 03/14/14 [phone]

## 2014-03-14 NOTE — ED Notes (Signed)
Pt here to have sutures removed from his right finger that were placed 15 days ago.

## 2014-07-11 DIAGNOSIS — C189 Malignant neoplasm of colon, unspecified: Secondary | ICD-10-CM | POA: Diagnosis not present

## 2014-07-11 DIAGNOSIS — I10 Essential (primary) hypertension: Secondary | ICD-10-CM | POA: Diagnosis not present

## 2014-07-11 DIAGNOSIS — G629 Polyneuropathy, unspecified: Secondary | ICD-10-CM | POA: Diagnosis not present

## 2014-07-11 DIAGNOSIS — Z6824 Body mass index (BMI) 24.0-24.9, adult: Secondary | ICD-10-CM | POA: Diagnosis not present

## 2014-07-11 DIAGNOSIS — L989 Disorder of the skin and subcutaneous tissue, unspecified: Secondary | ICD-10-CM | POA: Diagnosis not present

## 2014-07-11 DIAGNOSIS — R7301 Impaired fasting glucose: Secondary | ICD-10-CM | POA: Diagnosis not present

## 2014-07-11 DIAGNOSIS — E785 Hyperlipidemia, unspecified: Secondary | ICD-10-CM | POA: Diagnosis not present

## 2014-07-11 DIAGNOSIS — R972 Elevated prostate specific antigen [PSA]: Secondary | ICD-10-CM | POA: Diagnosis not present

## 2014-07-11 DIAGNOSIS — Z1389 Encounter for screening for other disorder: Secondary | ICD-10-CM | POA: Diagnosis not present

## 2014-08-15 ENCOUNTER — Other Ambulatory Visit: Payer: Self-pay | Admitting: Dermatology

## 2014-11-30 DIAGNOSIS — L308 Other specified dermatitis: Secondary | ICD-10-CM | POA: Diagnosis not present

## 2014-11-30 DIAGNOSIS — Z85828 Personal history of other malignant neoplasm of skin: Secondary | ICD-10-CM | POA: Diagnosis not present

## 2015-07-31 DIAGNOSIS — N390 Urinary tract infection, site not specified: Secondary | ICD-10-CM | POA: Diagnosis not present

## 2015-07-31 DIAGNOSIS — R7301 Impaired fasting glucose: Secondary | ICD-10-CM | POA: Diagnosis not present

## 2015-07-31 DIAGNOSIS — Z125 Encounter for screening for malignant neoplasm of prostate: Secondary | ICD-10-CM | POA: Diagnosis not present

## 2015-07-31 DIAGNOSIS — R8299 Other abnormal findings in urine: Secondary | ICD-10-CM | POA: Diagnosis not present

## 2015-07-31 DIAGNOSIS — I10 Essential (primary) hypertension: Secondary | ICD-10-CM | POA: Diagnosis not present

## 2015-07-31 DIAGNOSIS — E784 Other hyperlipidemia: Secondary | ICD-10-CM | POA: Diagnosis not present

## 2015-08-07 DIAGNOSIS — M19049 Primary osteoarthritis, unspecified hand: Secondary | ICD-10-CM | POA: Diagnosis not present

## 2015-08-07 DIAGNOSIS — C189 Malignant neoplasm of colon, unspecified: Secondary | ICD-10-CM | POA: Diagnosis not present

## 2015-08-07 DIAGNOSIS — Z1389 Encounter for screening for other disorder: Secondary | ICD-10-CM | POA: Diagnosis not present

## 2015-08-07 DIAGNOSIS — N200 Calculus of kidney: Secondary | ICD-10-CM | POA: Diagnosis not present

## 2015-08-07 DIAGNOSIS — Z6825 Body mass index (BMI) 25.0-25.9, adult: Secondary | ICD-10-CM | POA: Diagnosis not present

## 2015-08-07 DIAGNOSIS — Z0000 Encounter for general adult medical examination without abnormal findings: Secondary | ICD-10-CM | POA: Diagnosis not present

## 2015-08-07 DIAGNOSIS — R7309 Other abnormal glucose: Secondary | ICD-10-CM | POA: Diagnosis not present

## 2015-08-07 DIAGNOSIS — G6289 Other specified polyneuropathies: Secondary | ICD-10-CM | POA: Diagnosis not present

## 2015-08-08 DIAGNOSIS — Z1212 Encounter for screening for malignant neoplasm of rectum: Secondary | ICD-10-CM | POA: Diagnosis not present

## 2015-08-16 DIAGNOSIS — D1801 Hemangioma of skin and subcutaneous tissue: Secondary | ICD-10-CM | POA: Diagnosis not present

## 2015-08-16 DIAGNOSIS — D225 Melanocytic nevi of trunk: Secondary | ICD-10-CM | POA: Diagnosis not present

## 2015-08-16 DIAGNOSIS — L570 Actinic keratosis: Secondary | ICD-10-CM | POA: Diagnosis not present

## 2015-08-16 DIAGNOSIS — Z85828 Personal history of other malignant neoplasm of skin: Secondary | ICD-10-CM | POA: Diagnosis not present

## 2015-08-16 DIAGNOSIS — L821 Other seborrheic keratosis: Secondary | ICD-10-CM | POA: Diagnosis not present

## 2015-08-16 DIAGNOSIS — L814 Other melanin hyperpigmentation: Secondary | ICD-10-CM | POA: Diagnosis not present

## 2018-07-23 ENCOUNTER — Ambulatory Visit
Admission: EM | Admit: 2018-07-23 | Discharge: 2018-07-23 | Disposition: A | Discharge status: Home / Self Care | Payer: Medicare PPO | Attending: Physician Assistant | Admitting: Physician Assistant

## 2018-07-23 DIAGNOSIS — Y93K1 Activity, walking an animal: Secondary | ICD-10-CM

## 2018-07-23 DIAGNOSIS — Z23 Encounter for immunization: Secondary | ICD-10-CM

## 2018-07-23 DIAGNOSIS — S0181XA Laceration without foreign body of other part of head, initial encounter: Secondary | ICD-10-CM | POA: Diagnosis not present

## 2018-07-23 DIAGNOSIS — S61412A Laceration without foreign body of left hand, initial encounter: Secondary | ICD-10-CM

## 2018-07-23 MED ORDER — MUPIROCIN 2 % EX OINT: 1.0000 "application " | TOPICAL_OINTMENT | Freq: Two times a day (BID) | CUTANEOUS | 0 refills | Status: DC

## 2018-07-23 MED ORDER — TETANUS-DIPHTH-ACELL PERTUSSIS 5-2.5-18.5 LF-MCG/0.5 IM SUSP
0.5000 mL | Freq: Once | INTRAMUSCULAR | Status: AC
  Administered 2018-07-23: 0.5 mL via INTRAMUSCULAR

## 2018-07-23 NOTE — ED Provider Notes (Signed)
[facility]    CSN: [id number] Arrival date & time: 07/23/18  1459     History   Chief Complaint Chief Complaint  Patient presents with  . Laceration    HPI Jared [name] is a 78 y.o. male.   78 year old male comes in for evaluation of chin and left hand laceration. Patient states dog pulled on the leash, and he fell forward, hitting his chin and scraping his lands and knees. He denies loss of consciousness. Had has abrasions to the knee, right hand. He denies any trouble moving hand/knee. Able to bear weight without difficulty. Denies headache, blurry vision, weakness, dizziness, nausea/vomiting, photophobia. Denies blood thinner use. Patient states dressed the wounds and came in for evaluation.      Past Medical History:  Diagnosis Date  . Cancer [facility])    colon    There are no active problems to display for this patient.   Past Surgical History:  Procedure Laterality Date  . COLON SURGERY  2004   R hemicolectomy - removed due to cancer       Home Medications    Prior to Admission medications   Medication Sig Start Date End Date Taking? Authorizing Provider  cephALEXin (KEFLEX) 500 MG capsule Take 1 capsule (500 mg total) by mouth 3 (three) times daily. 02/27/14   Harden Mo, MD  HYDROcodone-acetaminophen (NORCO/VICODIN) 5-325 MG per tablet 1 to 2 tabs every 4 to 6 hours as needed for pain. 02/27/14   Harden Mo, MD  mupirocin ointment (BACTROBAN) 2 % Apply 1 application topically 2 (two) times daily. 07/23/18   Ok Edwards, PA-C    Family History Family History  Problem Relation Age of Onset  . Other Mother        old age  . Heart failure Father     Social History Social History   Tobacco Use  . Smoking status: Never Smoker  . Smokeless tobacco: Never Used  Substance Use Topics  . Alcohol use: No  . Drug use: No     Allergies   Patient has no known allergies.   Review of Systems Review of Systems  Reason unable to  perform ROS: See HPI as above.     Physical Exam Triage Vital Signs ED Triage Vitals [07/23/18 1510]  Enc Vitals Group     BP (!) 168/77     Pulse Rate 75     Resp 18     Temp (!) 97.1 F (36.2 C)     Temp Source Oral     SpO2 99 %     Weight      Height      Head Circumference      Peak Flow      Pain Score 3     Pain Loc      Pain Edu?      Excl. in [place]?    No data found.  Updated Vital Signs BP (!) 168/77 (BP Location: Left Arm)   Pulse 75   Temp (!) 97.1 F (36.2 C) (Oral)   Resp 18   SpO2 99%   Physical Exam Constitutional:      General: He is not in acute distress.    Appearance: He is well-developed. He is not ill-appearing, toxic-appearing or diaphoretic.  HENT:     Head: Normocephalic and atraumatic.     Comments: 2 cm laceration to the chin, bleeding controlled. Sensation intact.  Eyes:     Extraocular  Movements: Extraocular movements intact.     Conjunctiva/sclera: Conjunctivae normal.     Pupils: Pupils are equal, round, and reactive to light.  Neck:     Musculoskeletal: Normal range of motion and neck supple. No neck rigidity, spinous process tenderness or muscular tenderness.  Cardiovascular:     Rate and Rhythm: Normal rate and regular rhythm.     Heart sounds: No murmur. No friction rub. No gallop.   Pulmonary:     Effort: Pulmonary effort is normal.     Breath sounds: Normal breath sounds and air entry. No stridor, decreased air movement or transmitted upper airway sounds. No decreased breath sounds, wheezing, rhonchi or rales.  Musculoskeletal:     Comments: Left hand with 3 cm laceration to the palm around 1st MCP. Multiple abrasions to the right hand, both dorsally and on the palm. Bleeding controlled. Full ROM of fingers and wrist. No bony tenderness. Strength normal and equal bilaterally. Sensation intact and equal bilaterally. Radial pulse 2+, cap refill <2s.   Neurological:     General: No focal deficit present.     Mental Status: He is  alert and oriented to person, place, and time.     GCS: GCS eye subscore is 4. GCS verbal subscore is 5. GCS motor subscore is 6.     Cranial Nerves: Cranial nerves are intact.     Sensory: Sensation is intact.     Motor: Motor function is intact.     Coordination: Coordination is intact.     Gait: Gait is intact.      UC Treatments / Results  Labs (all labs ordered are listed, but only abnormal results are displayed) Labs Reviewed - No data to display  EKG None  Radiology No results found.  Procedures Laceration Repair Date/Time: 07/23/2018 5:05 PM Performed by: Ok Edwards, PA-C Authorized by: Ok Edwards, PA-C   Consent:    Consent obtained:  Verbal   Consent given by:  Patient   Risks discussed:  Infection, pain, poor cosmetic result, poor wound healing, retained foreign body, tendon damage, vascular damage, nerve damage and need for additional repair   Alternatives discussed:  Referral Anesthesia (see MAR for exact dosages):    Anesthesia method:  Local infiltration   Local anesthetic:  Lidocaine 2% WITH epi Laceration details:    Location:  Hand   Hand location:  L hand, dorsum   Length (cm):  3   Depth (mm):  5 Repair type:    Repair type:  Intermediate Exploration:    Hemostasis achieved with:  LET and direct pressure   Wound exploration: wound explored through full range of motion and entire depth of wound probed and visualized     Contaminated: no   Treatment:    Area cleansed with:  Hibiclens   Amount of cleaning:  Extensive   Irrigation solution:  Sterile saline   Irrigation method:  Pressure wash   Visualized foreign bodies/material removed: yes   Skin repair:    Repair method:  Sutures   Suture size:  4-0   Suture material:  Prolene   Suture technique:  Horizontal mattress   Number of sutures:  3 Approximation:    Approximation:  Close Post-procedure details:    Dressing:  Antibiotic ointment and bulky dressing   Patient tolerance of procedure:   Tolerated well, no immediate complications Laceration Repair Date/Time: 07/23/2018 5:13 PM Performed by: Ok Edwards, PA-C Authorized by: Ok Edwards, PA-C   Consent:  Consent obtained:  Verbal   Consent given by:  Patient   Risks discussed:  Infection, pain, retained foreign body, vascular damage, tendon damage, poor cosmetic result, poor wound healing, need for additional repair and nerve damage   Alternatives discussed:  Referral Anesthesia (see MAR for exact dosages):    Anesthesia method:  Local infiltration   Local anesthetic:  Lidocaine 2% WITH epi Laceration details:    Location:  Face   Face location:  Chin   Length (cm):  2   Depth (mm):  5 Repair type:    Repair type:  Simple Pre-procedure details:    Preparation:  Patient was prepped and draped in usual sterile fashion Exploration:    Hemostasis achieved with:  Epinephrine and direct pressure   Wound exploration: entire depth of wound probed and visualized   Treatment:    Area cleansed with:  Hibiclens   Amount of cleaning:  Standard   Irrigation solution:  Sterile saline   Irrigation method:  Pressure wash   Visualized foreign bodies/material removed: yes   Skin repair:    Repair method:  Sutures   Suture size:  5-0   Suture material:  Prolene   Number of sutures: 2 horizontal mattress, 1 simple interrupted. Approximation:    Approximation:  Close Post-procedure details:    Dressing:  Antibiotic ointment and bulky dressing   Patient tolerance of procedure:  Tolerated well, no immediate complications   (including critical care time)  Medications Ordered in UC Medications  Tdap (BOOSTRIX) injection 0.5 mL (0.5 mLs Intramuscular Given 07/23/18 1517)    Initial Impression / Assessment and Plan / UC Course  I have reviewed the triage vital signs and the nursing notes.  Pertinent labs & imaging results that were available during my care of the patient were reviewed by me and considered in my medical decision  making (see chart for details).    Discussed with patient, age greater than 22 years old, would benefit from CT scan ruling out intracranial bleed.  Risks and benefit discussed.  Patient declined CT scan, and would like to stay for laceration repair.  Neurology exam grossly intact, no obvious focal deficit.  Will proceed with laceration repair.  Patient tolerated procedure well.  Discussed given foreign body seen, would like to put patient on empiric antibiotic given possible retained foreign body.  Patient declined at this time, states will follow-up if show signs of infection.  3 horizontal mattress sutures to the left hand, 2 horizontal mattress, 1 simple interrupted suture to the chin.  Wound care instructions discussed.  Return precautions given.  Otherwise patient to follow-up in 5 days for suture removal of the chin, 7 days for suture removal of the hand.  Patient expresses understanding and agrees to plan.  Final Clinical Impressions(s) / UC Diagnoses   Final diagnoses:  Chin laceration, initial encounter  Laceration of left hand without foreign body, initial encounter    ED Prescriptions    Medication Sig Dispense Auth. Provider   mupirocin ointment (BACTROBAN) 2 % Apply 1 application topically 2 (two) times daily. 22 g Tobin Chad, [place] 07/23/18 1717

## 2018-07-23 NOTE — ED Triage Notes (Signed)
Pt states pulled down by his dog. Lac to chin, lac to palm of lt hand, abrasions to rt hand and knees; denies loc

## 2018-07-23 NOTE — Discharge Instructions (Addendum)
Tetanus updated today. 3 horizontal mattress sutures placed for the left hand. 2 horizontal mattress, 1 simple interrupted suture placed for the chin. You can remove current dressing in 24 hours. Keep wound clean and dry. You can clean gently with soap and water. Do not soak area in water. Monitor for spreading redness, increased warmth, increased swelling, fever, follow up for reevaluation needed. Otherwise follow up in 5 days for suture removal of the chin. 7 days for left hand suture removal.   If experiencing worse headache of your life, blurry vision, dizziness, one sided weakness, confusion, go to the emergency department for further evaluation needed.

## 2018-07-28 DIAGNOSIS — Z5189 Encounter for other specified aftercare: Secondary | ICD-10-CM | POA: Diagnosis not present

## 2018-07-28 DIAGNOSIS — W0110XS Fall on same level from slipping, tripping and stumbling with subsequent striking against unspecified object, sequela: Secondary | ICD-10-CM | POA: Diagnosis not present

## 2018-07-28 DIAGNOSIS — S60511A Abrasion of right hand, initial encounter: Secondary | ICD-10-CM | POA: Diagnosis not present

## 2018-07-28 DIAGNOSIS — Z4802 Encounter for removal of sutures: Secondary | ICD-10-CM | POA: Diagnosis not present

## 2018-07-31 DIAGNOSIS — Z5189 Encounter for other specified aftercare: Secondary | ICD-10-CM | POA: Diagnosis not present

## 2018-07-31 DIAGNOSIS — S61412A Laceration without foreign body of left hand, initial encounter: Secondary | ICD-10-CM | POA: Diagnosis not present

## 2018-07-31 DIAGNOSIS — Z4802 Encounter for removal of sutures: Secondary | ICD-10-CM | POA: Diagnosis not present

## 2018-07-31 DIAGNOSIS — Z6824 Body mass index (BMI) 24.0-24.9, adult: Secondary | ICD-10-CM | POA: Diagnosis not present

## 2018-08-18 DIAGNOSIS — E7849 Other hyperlipidemia: Secondary | ICD-10-CM | POA: Diagnosis not present

## 2018-08-18 DIAGNOSIS — Z125 Encounter for screening for malignant neoplasm of prostate: Secondary | ICD-10-CM | POA: Diagnosis not present

## 2018-08-18 DIAGNOSIS — R7301 Impaired fasting glucose: Secondary | ICD-10-CM | POA: Diagnosis not present

## 2018-08-18 DIAGNOSIS — I10 Essential (primary) hypertension: Secondary | ICD-10-CM | POA: Diagnosis not present

## 2018-08-19 DIAGNOSIS — L821 Other seborrheic keratosis: Secondary | ICD-10-CM | POA: Diagnosis not present

## 2018-08-19 DIAGNOSIS — D2261 Melanocytic nevi of right upper limb, including shoulder: Secondary | ICD-10-CM | POA: Diagnosis not present

## 2018-08-19 DIAGNOSIS — R82998 Other abnormal findings in urine: Secondary | ICD-10-CM | POA: Diagnosis not present

## 2018-08-19 DIAGNOSIS — D2262 Melanocytic nevi of left upper limb, including shoulder: Secondary | ICD-10-CM | POA: Diagnosis not present

## 2018-08-19 DIAGNOSIS — L111 Transient acantholytic dermatosis [Grover]: Secondary | ICD-10-CM | POA: Diagnosis not present

## 2018-08-19 DIAGNOSIS — L570 Actinic keratosis: Secondary | ICD-10-CM | POA: Diagnosis not present

## 2018-08-19 DIAGNOSIS — D225 Melanocytic nevi of trunk: Secondary | ICD-10-CM | POA: Diagnosis not present

## 2018-08-19 DIAGNOSIS — L309 Dermatitis, unspecified: Secondary | ICD-10-CM | POA: Diagnosis not present

## 2018-08-19 DIAGNOSIS — Z85828 Personal history of other malignant neoplasm of skin: Secondary | ICD-10-CM | POA: Diagnosis not present

## 2018-08-19 DIAGNOSIS — I10 Essential (primary) hypertension: Secondary | ICD-10-CM | POA: Diagnosis not present

## 2018-08-24 DIAGNOSIS — R809 Proteinuria, unspecified: Secondary | ICD-10-CM | POA: Diagnosis not present

## 2018-08-24 DIAGNOSIS — I10 Essential (primary) hypertension: Secondary | ICD-10-CM | POA: Diagnosis not present

## 2018-08-24 DIAGNOSIS — Z1339 Encounter for screening examination for other mental health and behavioral disorders: Secondary | ICD-10-CM | POA: Diagnosis not present

## 2018-08-24 DIAGNOSIS — R7301 Impaired fasting glucose: Secondary | ICD-10-CM | POA: Diagnosis not present

## 2018-08-24 DIAGNOSIS — C189 Malignant neoplasm of colon, unspecified: Secondary | ICD-10-CM | POA: Diagnosis not present

## 2018-08-24 DIAGNOSIS — E785 Hyperlipidemia, unspecified: Secondary | ICD-10-CM | POA: Diagnosis not present

## 2018-08-24 DIAGNOSIS — Z1331 Encounter for screening for depression: Secondary | ICD-10-CM | POA: Diagnosis not present

## 2018-08-24 DIAGNOSIS — Z0000 Encounter for general adult medical examination without abnormal findings: Secondary | ICD-10-CM | POA: Diagnosis not present

## 2019-08-20 DIAGNOSIS — E7849 Other hyperlipidemia: Secondary | ICD-10-CM | POA: Diagnosis not present

## 2019-08-20 DIAGNOSIS — Z125 Encounter for screening for malignant neoplasm of prostate: Secondary | ICD-10-CM | POA: Diagnosis not present

## 2019-08-20 DIAGNOSIS — R739 Hyperglycemia, unspecified: Secondary | ICD-10-CM | POA: Diagnosis not present

## 2019-08-20 DIAGNOSIS — Z0000 Encounter for general adult medical examination without abnormal findings: Secondary | ICD-10-CM | POA: Diagnosis not present

## 2019-08-23 DIAGNOSIS — D225 Melanocytic nevi of trunk: Secondary | ICD-10-CM | POA: Diagnosis not present

## 2019-08-23 DIAGNOSIS — R82998 Other abnormal findings in urine: Secondary | ICD-10-CM | POA: Diagnosis not present

## 2019-08-23 DIAGNOSIS — I10 Essential (primary) hypertension: Secondary | ICD-10-CM | POA: Diagnosis not present

## 2019-08-23 DIAGNOSIS — L821 Other seborrheic keratosis: Secondary | ICD-10-CM | POA: Diagnosis not present

## 2019-08-23 DIAGNOSIS — L814 Other melanin hyperpigmentation: Secondary | ICD-10-CM | POA: Diagnosis not present

## 2019-08-23 DIAGNOSIS — L570 Actinic keratosis: Secondary | ICD-10-CM | POA: Diagnosis not present

## 2019-08-23 DIAGNOSIS — Z85828 Personal history of other malignant neoplasm of skin: Secondary | ICD-10-CM | POA: Diagnosis not present

## 2019-08-23 DIAGNOSIS — L111 Transient acantholytic dermatosis [Grover]: Secondary | ICD-10-CM | POA: Diagnosis not present

## 2019-08-24 DIAGNOSIS — Z1212 Encounter for screening for malignant neoplasm of rectum: Secondary | ICD-10-CM | POA: Diagnosis not present

## 2019-10-14 DIAGNOSIS — J310 Chronic rhinitis: Secondary | ICD-10-CM | POA: Diagnosis not present

## 2019-10-14 DIAGNOSIS — G629 Polyneuropathy, unspecified: Secondary | ICD-10-CM | POA: Diagnosis not present

## 2019-10-14 DIAGNOSIS — I10 Essential (primary) hypertension: Secondary | ICD-10-CM | POA: Diagnosis not present

## 2019-10-14 DIAGNOSIS — R2681 Unsteadiness on feet: Secondary | ICD-10-CM | POA: Diagnosis not present

## 2019-10-14 DIAGNOSIS — M545 Low back pain: Secondary | ICD-10-CM | POA: Diagnosis not present

## 2019-10-14 DIAGNOSIS — M19049 Primary osteoarthritis, unspecified hand: Secondary | ICD-10-CM | POA: Diagnosis not present

## 2019-10-14 DIAGNOSIS — R739 Hyperglycemia, unspecified: Secondary | ICD-10-CM | POA: Diagnosis not present

## 2019-10-14 DIAGNOSIS — Z0000 Encounter for general adult medical examination without abnormal findings: Secondary | ICD-10-CM | POA: Diagnosis not present

## 2019-10-18 DIAGNOSIS — R195 Other fecal abnormalities: Secondary | ICD-10-CM | POA: Diagnosis not present

## 2019-10-18 DIAGNOSIS — Z85038 Personal history of other malignant neoplasm of large intestine: Secondary | ICD-10-CM | POA: Diagnosis not present

## 2019-11-15 DIAGNOSIS — R2689 Other abnormalities of gait and mobility: Secondary | ICD-10-CM | POA: Diagnosis not present

## 2019-11-15 DIAGNOSIS — G8929 Other chronic pain: Secondary | ICD-10-CM | POA: Diagnosis not present

## 2019-11-15 DIAGNOSIS — Z789 Other specified health status: Secondary | ICD-10-CM | POA: Diagnosis not present

## 2019-11-15 DIAGNOSIS — M545 Low back pain: Secondary | ICD-10-CM | POA: Diagnosis not present

## 2019-11-15 DIAGNOSIS — R2681 Unsteadiness on feet: Secondary | ICD-10-CM | POA: Diagnosis not present

## 2019-11-15 DIAGNOSIS — G629 Polyneuropathy, unspecified: Secondary | ICD-10-CM | POA: Diagnosis not present

## 2019-11-15 DIAGNOSIS — Z7409 Other reduced mobility: Secondary | ICD-10-CM | POA: Diagnosis not present

## 2019-12-06 DIAGNOSIS — G629 Polyneuropathy, unspecified: Secondary | ICD-10-CM | POA: Diagnosis not present

## 2019-12-06 DIAGNOSIS — R2681 Unsteadiness on feet: Secondary | ICD-10-CM | POA: Diagnosis not present

## 2019-12-06 DIAGNOSIS — Z789 Other specified health status: Secondary | ICD-10-CM | POA: Diagnosis not present

## 2019-12-06 DIAGNOSIS — R2689 Other abnormalities of gait and mobility: Secondary | ICD-10-CM | POA: Diagnosis not present

## 2019-12-06 DIAGNOSIS — Z7409 Other reduced mobility: Secondary | ICD-10-CM | POA: Diagnosis not present

## 2019-12-06 DIAGNOSIS — G8929 Other chronic pain: Secondary | ICD-10-CM | POA: Diagnosis not present

## 2019-12-06 DIAGNOSIS — M545 Low back pain: Secondary | ICD-10-CM | POA: Diagnosis not present

## 2019-12-15 DIAGNOSIS — Z1159 Encounter for screening for other viral diseases: Secondary | ICD-10-CM | POA: Diagnosis not present

## 2019-12-20 DIAGNOSIS — K633 Ulcer of intestine: Secondary | ICD-10-CM | POA: Diagnosis not present

## 2019-12-20 DIAGNOSIS — Z980 Intestinal bypass and anastomosis status: Secondary | ICD-10-CM | POA: Diagnosis not present

## 2019-12-20 DIAGNOSIS — K640 First degree hemorrhoids: Secondary | ICD-10-CM | POA: Diagnosis not present

## 2019-12-20 DIAGNOSIS — Z85038 Personal history of other malignant neoplasm of large intestine: Secondary | ICD-10-CM | POA: Diagnosis not present

## 2019-12-20 DIAGNOSIS — K5730 Diverticulosis of large intestine without perforation or abscess without bleeding: Secondary | ICD-10-CM | POA: Diagnosis not present

## 2020-01-05 DIAGNOSIS — R2689 Other abnormalities of gait and mobility: Secondary | ICD-10-CM | POA: Diagnosis not present

## 2020-01-05 DIAGNOSIS — G8929 Other chronic pain: Secondary | ICD-10-CM | POA: Diagnosis not present

## 2020-01-05 DIAGNOSIS — Z789 Other specified health status: Secondary | ICD-10-CM | POA: Diagnosis not present

## 2020-01-05 DIAGNOSIS — Z7409 Other reduced mobility: Secondary | ICD-10-CM | POA: Diagnosis not present

## 2020-01-05 DIAGNOSIS — M545 Low back pain: Secondary | ICD-10-CM | POA: Diagnosis not present

## 2020-01-05 DIAGNOSIS — G629 Polyneuropathy, unspecified: Secondary | ICD-10-CM | POA: Diagnosis not present

## 2020-01-05 DIAGNOSIS — R2681 Unsteadiness on feet: Secondary | ICD-10-CM | POA: Diagnosis not present

## 2020-01-19 DIAGNOSIS — M545 Low back pain: Secondary | ICD-10-CM | POA: Diagnosis not present

## 2020-01-19 DIAGNOSIS — R2689 Other abnormalities of gait and mobility: Secondary | ICD-10-CM | POA: Diagnosis not present

## 2020-01-19 DIAGNOSIS — Z7409 Other reduced mobility: Secondary | ICD-10-CM | POA: Diagnosis not present

## 2020-01-19 DIAGNOSIS — G8929 Other chronic pain: Secondary | ICD-10-CM | POA: Diagnosis not present

## 2020-01-19 DIAGNOSIS — G629 Polyneuropathy, unspecified: Secondary | ICD-10-CM | POA: Diagnosis not present

## 2020-01-19 DIAGNOSIS — Z789 Other specified health status: Secondary | ICD-10-CM | POA: Diagnosis not present

## 2020-01-19 DIAGNOSIS — R2681 Unsteadiness on feet: Secondary | ICD-10-CM | POA: Diagnosis not present

## 2020-01-26 DIAGNOSIS — Z7409 Other reduced mobility: Secondary | ICD-10-CM | POA: Diagnosis not present

## 2020-01-26 DIAGNOSIS — G8929 Other chronic pain: Secondary | ICD-10-CM | POA: Diagnosis not present

## 2020-01-26 DIAGNOSIS — G629 Polyneuropathy, unspecified: Secondary | ICD-10-CM | POA: Diagnosis not present

## 2020-01-26 DIAGNOSIS — Z789 Other specified health status: Secondary | ICD-10-CM | POA: Diagnosis not present

## 2020-01-26 DIAGNOSIS — R2689 Other abnormalities of gait and mobility: Secondary | ICD-10-CM | POA: Diagnosis not present

## 2020-01-26 DIAGNOSIS — M545 Low back pain: Secondary | ICD-10-CM | POA: Diagnosis not present

## 2020-01-26 DIAGNOSIS — R2681 Unsteadiness on feet: Secondary | ICD-10-CM | POA: Diagnosis not present

## 2020-01-27 ENCOUNTER — Ambulatory Visit
Admission: EM | Admit: 2020-01-27 | Discharge: 2020-01-27 | Disposition: A | Discharge status: Home / Self Care | Payer: Medicare PPO | Attending: Emergency Medicine | Admitting: Emergency Medicine

## 2020-01-27 ENCOUNTER — Other Ambulatory Visit: Payer: Self-pay

## 2020-01-27 DIAGNOSIS — R21 Rash and other nonspecific skin eruption: Secondary | ICD-10-CM | POA: Diagnosis not present

## 2020-01-27 DIAGNOSIS — B369 Superficial mycosis, unspecified: Secondary | ICD-10-CM

## 2020-01-27 MED ORDER — CLOTRIMAZOLE-BETAMETHASONE 1-0.05 % EX CREA: TOPICAL_CREAM | CUTANEOUS | 0 refills | Status: DC

## 2020-01-27 MED ORDER — FLUCONAZOLE 150 MG PO TABS: 150.0000 mg | ORAL_TABLET | ORAL | 0 refills | Status: AC

## 2020-01-27 NOTE — ED Triage Notes (Signed)
Pt states infection started approx 2-3 weeks ago and has progressively worsened. Pt is aox4 and ambulatory.

## 2020-01-27 NOTE — ED Provider Notes (Signed)
[facility]    CSN: [id number] Arrival date & time: 01/27/20  1137      History   Chief Complaint Chief Complaint  Patient presents with   Hand Pain    Left middle finger    HPI Jared [name] is a 79 y.o. male presenting today for evaluation of rash to finger. Patient reports over the past 3 weeks he has developed discoloration and dryness to his left middle finger. Concerned about possible fungal infection. Denies injury or trauma. Denies difficulty bending. Denies associated pain, has had some mild itching. Using medicated ointment without relief. Has progressively worsened. Denies history of similar. Denies rash elsewhere.  HPI  Past Medical History:  Diagnosis Date   Cancer ([place])    colon    There are no problems to display for this patient.   Past Surgical History:  Procedure Laterality Date   COLON SURGERY  2004   R hemicolectomy - removed due to cancer       Home Medications    Prior to Admission medications   Medication Sig Start Date End Date Taking? Authorizing Provider  clotrimazole-betamethasone (LOTRISONE) cream Apply to affected area 2 times daily x 2 weeks 01/27/20   Shericka Johnstone C, PA-C  fluconazole (DIFLUCAN) 150 MG tablet Take 1 tablet (150 mg total) by mouth once a week for 4 doses. 01/27/20 02/18/20  Lanesha Azzaro C, PA-C  HYDROcodone-acetaminophen (NORCO/VICODIN) 5-325 MG per tablet 1 to 2 tabs every 4 to 6 hours as needed for pain. 02/27/14   Harden Mo, MD    Family History Family History  Problem Relation Age of Onset   Other Mother        old age   Heart failure Father     Social History Social History   Tobacco Use   Smoking status: Never Smoker   Smokeless tobacco: Never Used  Scientific laboratory technician Use: Never used  Substance Use Topics   Alcohol use: No   Drug use: No     Allergies   Patient has no known allergies.   Review of Systems Review of Systems  Constitutional: Negative for  fatigue and fever.  Eyes: Negative for redness, itching and visual disturbance.  Respiratory: Negative for shortness of breath.   Cardiovascular: Negative for chest pain and leg swelling.  Gastrointestinal: Negative for nausea and vomiting.  Musculoskeletal: Negative for arthralgias and myalgias.  Skin: Positive for color change and rash. Negative for wound.  Neurological: Negative for dizziness, syncope, weakness, light-headedness and headaches.     Physical Exam Triage Vital Signs ED Triage Vitals  Enc Vitals Group     BP 01/27/20 1203 132/85     Pulse Rate 01/27/20 1203 61     Resp 01/27/20 1203 18     Temp 01/27/20 1203 98.2 F (36.8 C)     Temp Source 01/27/20 1203 Oral     SpO2 01/27/20 1203 97 %     Weight --      Height --      Head Circumference --      Peak Flow --      Pain Score 01/27/20 1205 0     Pain Loc --      Pain Edu? --      Excl. in [place]? --    No data found.  Updated Vital Signs BP 132/85 (BP Location: Left Arm)    Pulse 61    Temp 98.2 F (36.8 C) (Oral)  Resp 18    SpO2 97%   Visual Acuity Right Eye Distance:   Left Eye Distance:   Bilateral Distance:    Right Eye Near:   Left Eye Near:    Bilateral Near:     Physical Exam Vitals and nursing note reviewed.  Constitutional:      Appearance: He is well-developed.     Comments: No acute distress  HENT:     Head: Normocephalic and atraumatic.     Nose: Nose normal.  Eyes:     Conjunctiva/sclera: Conjunctivae normal.  Cardiovascular:     Rate and Rhythm: Normal rate.  Pulmonary:     Effort: Pulmonary effort is normal. No respiratory distress.  Abdominal:     General: There is no distension.  Musculoskeletal:        General: Normal range of motion.     Cervical back: Neck supple.  Skin:    General: Skin is warm and dry.     Comments: Left middle finger with scaling and faint erythema noted around nailbed and distal finger pad, slight skin colored papular bumps noted extending more  proximally along the lateral edge of finger, nontender to palpation, full active range of motion at DIP and PIP  Neurological:     Mental Status: He is alert and oriented to person, place, and time.      UC Treatments / Results  Labs (all labs ordered are listed, but only abnormal results are displayed) Labs Reviewed - No data to display  EKG   Radiology No results found.  Procedures Procedures (including critical care time)  Medications Ordered in UC Medications - No data to display  Initial Impression / Assessment and Plan / UC Course  I have reviewed the triage vital signs and the nursing notes.  Pertinent labs & imaging results that were available during my care of the patient were reviewed by me and considered in my medical decision making (see chart for details).     Rash does appear more fungal versus eczematous rather than cellulitic/paronychia. Nontender to palpation. Opting to treat for fungal with Diflucan weekly x3 to 4 weeks, Lotrisone topically. Keep finger clean and dry.  Discussed strict return precautions. Patient verbalized understanding and is agreeable with plan.  Final Clinical Impressions(s) / UC Diagnoses   Final diagnoses:  Rash and nonspecific skin eruption  Fungal infection of skin     Discharge Instructions     Diflucan once weekly for the next 3 to 4 weeks Lotrisone twice daily to finger Follow-up if not improving or worsening, developing increased pain swelling or redness to area   ED Prescriptions    Medication Sig Dispense Auth. Provider   fluconazole (DIFLUCAN) 150 MG tablet Take 1 tablet (150 mg total) by mouth once a week for 4 doses. 4 tablet Xayla Puzio C, PA-C   clotrimazole-betamethasone (LOTRISONE) cream Apply to affected area 2 times daily x 2 weeks 15 g Alexius Ellington, [place] C, PA-C     PDMP not reviewed this encounter.   Janith Lima, [place] 01/27/20 1829

## 2020-01-27 NOTE — Discharge Instructions (Signed)
Diflucan once weekly for the next 3 to 4 weeks Lotrisone twice daily to finger Follow-up if not improving or worsening, developing increased pain swelling or redness to area

## 2020-02-01 DIAGNOSIS — R2689 Other abnormalities of gait and mobility: Secondary | ICD-10-CM | POA: Diagnosis not present

## 2020-02-01 DIAGNOSIS — R2681 Unsteadiness on feet: Secondary | ICD-10-CM | POA: Diagnosis not present

## 2020-02-01 DIAGNOSIS — G8929 Other chronic pain: Secondary | ICD-10-CM | POA: Diagnosis not present

## 2020-02-01 DIAGNOSIS — Z7409 Other reduced mobility: Secondary | ICD-10-CM | POA: Diagnosis not present

## 2020-02-01 DIAGNOSIS — Z789 Other specified health status: Secondary | ICD-10-CM | POA: Diagnosis not present

## 2020-02-01 DIAGNOSIS — M545 Low back pain: Secondary | ICD-10-CM | POA: Diagnosis not present

## 2020-02-01 DIAGNOSIS — G629 Polyneuropathy, unspecified: Secondary | ICD-10-CM | POA: Diagnosis not present

## 2020-02-07 ENCOUNTER — Encounter: Payer: Self-pay | Admitting: Neurology

## 2020-02-07 ENCOUNTER — Other Ambulatory Visit: Payer: Self-pay | Admitting: Neurology

## 2020-02-08 ENCOUNTER — Ambulatory Visit (INDEPENDENT_AMBULATORY_CARE_PROVIDER_SITE_OTHER): Payer: Medicare PPO | Admitting: Neurology

## 2020-02-08 ENCOUNTER — Other Ambulatory Visit: Payer: Self-pay

## 2020-02-08 ENCOUNTER — Encounter: Payer: Self-pay | Admitting: Neurology

## 2020-02-08 VITALS — BP 138/84 | HR 78 | Ht 70.0 in | Wt 163.0 lb

## 2020-02-08 DIAGNOSIS — H903 Sensorineural hearing loss, bilateral: Secondary | ICD-10-CM | POA: Diagnosis not present

## 2020-02-08 DIAGNOSIS — T451X5A Adverse effect of antineoplastic and immunosuppressive drugs, initial encounter: Secondary | ICD-10-CM | POA: Insufficient documentation

## 2020-02-08 DIAGNOSIS — R7302 Impaired glucose tolerance (oral): Secondary | ICD-10-CM

## 2020-02-08 DIAGNOSIS — G620 Drug-induced polyneuropathy: Secondary | ICD-10-CM | POA: Diagnosis not present

## 2020-02-08 DIAGNOSIS — Z9181 History of falling: Secondary | ICD-10-CM | POA: Diagnosis not present

## 2020-02-08 DIAGNOSIS — G629 Polyneuropathy, unspecified: Secondary | ICD-10-CM

## 2020-02-08 NOTE — Addendum Note (Signed)
Addended by: Larey Seat on: 02/08/2020 12:04 PM   Modules accepted: Orders

## 2020-02-08 NOTE — Progress Notes (Addendum)
Provider:  Larey Seat, M D  Referring Provider: Leanna Battles, MD Primary Care Physician:  Bevelyn Buckles, MD  Chief Complaint  Patient presents with  . New Patient (Initial Visit)    pt alone, rm 10. presents with ongoing neuropathy that is post chemo treatment. this initially presented in bilateral upper extremities and worked down into lower extremities. he states now its not as noticeable in hands but has significantly worsened in lower extremities and has affected his balance. patient is very unsteady with his gait. no falls yet but states he is able to catch himself before falling.     HPI:  Jared [name]. is a 79 y.o. male of [place] descent and  seen here on 02-09-2020 upon  a referral from Dr. 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   [name] reports onset of neuropathy following his treatments for colon cancer, diagnosed 15 years ago. Oxalo-platinum ws a key ingredient in the chemotherapy that followed. This concluded in the year 2006, after treatment for  several month, he immediately noted numbness in his toes and soon after fingers.  This numbness has ascendent and now he is not able to tell where in space his feet or lower leg are- he has impaired propioception. May be coincidentially, he lost hearing - now needed aids.   He is now furniture and wall surfing, has had many near falls. He loses balance while standing at the kitchen counter, bending over the kitchen sink/ baisin, he falls forward but he has also felt pulled backwards, too.   He already has been in PT for balance and fall prevention, he exercises frequently at home.  It has not helped, his strength is fine, his coordination and propioception. He has to look at his feet when walking- can't walk on uneven surfaces.   He takes B vitamins and vitamin D. Has taken many,many supplements, and has not taken the COVID vaccine (!)>     Review of Systems: Out of  a complete 14 system review, the patient complains of only the following symptoms, and all other reviewed systems are negative.   hearing loss, Ataxia, numbness, falls.    Social History   Socioeconomic History  . Marital status: Married    Spouse name: Not on file  . Number of children: Not on file  . Years of education: Not on file  . Highest education level: Not on file  Occupational History  . Not on file  Tobacco Use  . Smoking status: Never Smoker  . Smokeless tobacco: Never Used  Vaping Use  . Vaping Use: Never used  Substance and Sexual Activity  . Alcohol use: Yes    Alcohol/week: 1.0 standard drink    Types: 1 Glasses of wine per week  . Drug use: No  . Sexual activity: Not on file  Other Topics Concern  . Not on file  Social History Narrative  . Not on file   Social Determinants of Health   Financial Resource Strain:   . Difficulty of Paying Living Expenses: Not on file  Food Insecurity:   . Worried About Charity fundraiser in the Last Year: Not on file  . Ran Out of Food in the Last Year: Not on file  Transportation Needs:   . Lack of Transportation (Medical): Not on file  . Lack of Transportation (Non-Medical): Not on file  Physical Activity:   . Days of Exercise per Week: Not on  file  . Minutes of Exercise per Session: Not on file  Stress:   . Feeling of Stress : Not on file  Social Connections:   . Frequency of Communication with Friends and Family: Not on file  . Frequency of Social Gatherings with Friends and Family: Not on file  . Attends Religious Services: Not on file  . Active Member of Clubs or Organizations: Not on file  . Attends Archivist Meetings: Not on file  . Marital Status: Not on file  Intimate Partner Violence:   . Fear of Current or Ex-Partner: Not on file  . Emotionally Abused: Not on file  . Physically Abused: Not on file  . Sexually Abused: Not on file    Family History  Problem Relation Age of Onset  .  Other Mother        old age  . Hodgkin's lymphoma Mother   . Heart failure Father     Past Medical History:  Diagnosis Date  . Cancer ([place])    colon  . Hypertension   . Neuropathy due to chemotherapeutic drug [facility])     Past Surgical History:  Procedure Laterality Date  . COLON SURGERY  2004   R hemicolectomy - removed due to cancer  . COLONOSCOPY    . HERNIA REPAIR  2005    Current Outpatient Medications  Medication Sig Dispense Refill  . fluconazole (DIFLUCAN) 150 MG tablet Take 1 tablet (150 mg total) by mouth once a week for 4 doses. 4 tablet 0   No current facility-administered medications for this visit.    Allergies as of 02/08/2020  . (No Known Allergies)    Vitals: BP 138/84   Pulse 78   Ht 5\' 7"  (1.702 m)   Wt 163 lb (73.9 kg)   BMI 25.53 kg/m  Last Weight:  Wt Readings from Last 1 Encounters:  02/08/20 163 lb (73.9 kg)   Last Height:   Ht Readings from Last 1 Encounters:  02/08/20 5\' 7"  (1.702 m)    Physical exam:  General: The patient is awake, alert and appears not in acute distress. The patient is well groomed. Head: Normocephalic, atraumatic. Neck is supple. Mallampati 2, neck circumference:16.5 Cardiovascular:  Regular rate and rhythm, without  murmurs or carotid bruit, and without distended neck veins. Respiratory: Lungs are clear to auscultation. Skin:  Without evidence of edema, or rash Trunk: BMI is 25   Neurologic exam : The patient is awake and alert, oriented to place and time.  Memory subjective  described as intact. There is a normal attention span & concentration ability. Speech is fluent without  dysarthria, dysphonia or aphasia. Mood and affect are appropriate.  Cranial nerves: Pupils are equal and briskly reactive to light. Funduscopic exam without evidence of pallor or edema.  Extraocular movements in vertical and horizontal planes intact and without nystagmus.  Visual fields by finger perimetry are intact. Hearing to  finger rub intact.  Facial sensation intact to fine touch. Facial motor strength is symmetric and tongue and uvula move midline. Tongue protrusion into either cheek is normal. Shoulder shrug is normal.   Motor exam:   Normal tone ,muscle bulk and symmetric strength in all extremities.  Sensory:  Fine touch, pinprick and vibration were tested in all extremities.  The symptoms of vibration was severely impaired below both knees.  There was no detection of vibration on the chin or at the toes or ankles.  There is also no pain it is profound numbness  to temperature and vibration. Proprioception was impaired as noted in coordination testing, there was a right pronator drift that the patient was not aware of.  Coordination: Rapid alternating movements in the fingers/hands were normal. Finger-to-nose maneuver impaired with evidence of of ataxia, dysmetria -but no tremor.  Gait and station: Patient walks without assistive device and is able unassisted to rise, but became immediately unsteady- he walked without assistance-the patient's feet point outwards he has a wider based gait and stance.  He keeps his eyes to the feet.  And when I asked him to look straight ahead his gait became more unsteady.  I do not clearly see a propulsive or retropulsive tendency but rather a proprioception impairment in all directions.  Motor tone and core strength strength within normal limits. Stance is stable as long as wide-based . Tandem gait is impossible - Romberg testing is negative (!)  Deep tendon reflexes: in the upper extremities are symmetric and intact.  Lower extremity reflexes are severely attenuated. trace at best.  Babinski maneuver response is downgoing.   Assessment:  After physical and neurologic examination, review of laboratory studies, imaging, neurophysiology testing and pre-existing records, assessment is that of :  Sensory loss to vibration/ temperature and impaired propioception.     Plan:   Treatment plan and additional workup :  I can certainly further diagnose by NCV and EMG, but I am not aware of any therapeutic consequences.  I discouraged walking barefoot because of the risk of injuries stepping into a sharp object for example.  I think his currently use shoes are very good for him because there are of light weight but give enough support up to the ankle.  Sandals or heel open shoes are also to be discouraged as a lead to falls.  I encouraged the use of either a cane or maybe even a walker for longer distances especially when turning.  I asked him to stay for a neuropathy panel and return for  EMG and NCV.   Rv prn.    Asencion Partridge Ras Kollman MD 02/08/2020

## 2020-02-08 NOTE — Patient Instructions (Signed)

## 2020-02-09 ENCOUNTER — Telehealth: Payer: Self-pay | Admitting: Neurology

## 2020-02-09 NOTE — Telephone Encounter (Signed)
-----   Message from Larey Seat, MD sent at 02/09/2020  8:37 AM EDT ----- This was a non -fasting glucose level, so that the 102 mg/dL are not concerning.

## 2020-02-09 NOTE — Progress Notes (Signed)
We now have a normal differential Blood Cell Count. Normal sed rate at 2 mm. Negative ANA. Pending is e-phoresis. Awaiting appointment for NCV and EMG. CD  Cc Dr Philip Aspen, MD- not be addressed by [facility].

## 2020-02-09 NOTE — Progress Notes (Signed)
This was a non -fasting glucose level, so that the 102 mg/dL are not concerning.

## 2020-02-09 NOTE — Telephone Encounter (Signed)
Called the patient and advised of the lab results. Advised so far everything that has come back normal. There is one lab that is pending and I advised the patient that if it is abnormal I will contact him. Pt verbalized understanding. Patient advised that he gave me wrong height and is 5'10. Informed him we will make that correction in the computer. Pt verbalized understanding. Pt had no questions at this time but was encouraged to call back if questions arise.

## 2020-02-10 LAB — COMPREHENSIVE METABOLIC PANEL
ALT: 14 IU/L (ref 0–44)
AST: 19 IU/L (ref 0–40)
Albumin/Globulin Ratio: 1.6 (ref 1.2–2.2)
Albumin: 4.6 g/dL (ref 3.7–4.7)
Alkaline Phosphatase: 84 IU/L (ref 44–121)
BUN/Creatinine Ratio: 20 (ref 10–24)
BUN: 18 mg/dL (ref 8–27)
Bilirubin Total: 0.2 mg/dL (ref 0.0–1.2)
CO2: 25 mmol/L (ref 20–29)
Calcium: 10.1 mg/dL (ref 8.6–10.2)
Chloride: 105 mmol/L (ref 96–106)
Creatinine, Ser: 0.9 mg/dL (ref 0.76–1.27)
GFR calc Af Amer: 95 mL/min/{1.73_m2} (ref >59)
GFR calc non Af Amer: 82 mL/min/{1.73_m2} (ref >59)
Globulin, Total: 2.9 g/dL (ref 1.5–4.5)
Glucose: 102 mg/dL — ABNORMAL HIGH (ref 65–99)
Potassium: 5.3 mmol/L — ABNORMAL HIGH (ref 3.5–5.2)
Sodium: 144 mmol/L (ref 134–144)
Total Protein: 7.5 g/dL (ref 6.0–8.5)

## 2020-02-10 LAB — CBC WITH DIFFERENTIAL/PLATELET
Basophils Absolute: 0.1 10*3/uL (ref 0.0–0.2)
Basos: 1 %
EOS (ABSOLUTE): 0.4 10*3/uL (ref 0.0–0.4)
Eos: 5 %
Hematocrit: 43.1 % (ref 37.5–51.0)
Hemoglobin: 14.3 g/dL (ref 13.0–17.7)
Immature Grans (Abs): 0 10*3/uL (ref 0.0–0.1)
Immature Granulocytes: 0 %
Lymphocytes Absolute: 2.7 10*3/uL (ref 0.7–3.1)
Lymphs: 33 %
MCH: 30.4 pg (ref 26.6–33.0)
MCHC: 33.2 g/dL (ref 31.5–35.7)
MCV: 92 fL (ref 79–97)
Monocytes Absolute: 0.8 10*3/uL (ref 0.1–0.9)
Monocytes: 10 %
Neutrophils Absolute: 4 10*3/uL (ref 1.4–7.0)
Neutrophils: 51 %
Platelets: 307 10*3/uL (ref 150–450)
RBC: 4.7 x10E6/uL (ref 4.14–5.80)
RDW: 11.8 % (ref 11.6–15.4)
WBC: 8 10*3/uL (ref 3.4–10.8)

## 2020-02-10 LAB — PROTEIN ELECTROPHORESIS, SERUM
A/G Ratio: 1.3 (ref 0.7–1.7)
Albumin ELP: 4.3 g/dL (ref 2.9–4.4)
Alpha 1: 0.2 g/dL (ref 0.0–0.4)
Alpha 2: 0.7 g/dL (ref 0.4–1.0)
Beta: 1 g/dL (ref 0.7–1.3)
Gamma Globulin: 1.4 g/dL (ref 0.4–1.8)
Globulin, Total: 3.2 g/dL (ref 2.2–3.9)

## 2020-02-10 LAB — SEDIMENTATION RATE: Sed Rate: 2 mm/hr (ref 0–30)

## 2020-02-10 LAB — ANA W/REFLEX IF POSITIVE: Anti Nuclear Antibody (ANA): NEGATIVE

## 2020-02-10 NOTE — Progress Notes (Signed)
Normal SPE pattern - Happy Birthday!

## 2020-03-02 ENCOUNTER — Ambulatory Visit (INDEPENDENT_AMBULATORY_CARE_PROVIDER_SITE_OTHER): Payer: Medicare PPO | Admitting: Diagnostic Neuroimaging

## 2020-03-02 ENCOUNTER — Encounter (INDEPENDENT_AMBULATORY_CARE_PROVIDER_SITE_OTHER): Payer: Medicare PPO | Admitting: Diagnostic Neuroimaging

## 2020-03-02 ENCOUNTER — Other Ambulatory Visit: Payer: Self-pay

## 2020-03-02 DIAGNOSIS — G629 Polyneuropathy, unspecified: Secondary | ICD-10-CM | POA: Diagnosis not present

## 2020-03-02 DIAGNOSIS — Z0289 Encounter for other administrative examinations: Secondary | ICD-10-CM

## 2020-03-02 NOTE — Procedures (Signed)
[facility]  NCS (NERVE CONDUCTION STUDY) WITH EMG (ELECTROMYOGRAPHY) REPORT   STUDY DATE: 03/02/20 PATIENT NAME: Jared [name]. DOB: 06-15-1940 MRN: [id number]  ORDERING CLINICIAN: Larey Seat, MD   TECHNOLOGIST: Sherre Scarlet ELECTROMYOGRAPHER: Earlean Polka. Ranger Petrich, MD  CLINICAL INFORMATION: 79 year old male with lower extremity numbness and gait difficulty.  FINDINGS: NERVE CONDUCTION STUDY: Bilateral peroneal and tibial motor responses are normal.    Bilateral sural and superficial peroneal sensory responses are normal.  Bilateral tibial F wave latencies are normal.   NEEDLE ELECTROMYOGRAPHY:  Needle examination of right lower extremity vastus medialis, tibialis anterior, gastrocnemius is normal.   IMPRESSION:   This is a normal study.  No electrodiagnostic evidence of large fiber neuropathy at this time.     INTERPRETING PHYSICIAN:  Penni Bombard, MD Certified in Neurology, Neurophysiology and Neuroimaging  [facility] [address] [place], [place] [address] [phone]   [facility]    Nerve / Sites Muscle Latency Ref. Amplitude Ref. Rel Amp Segments Distance Velocity Ref. Area    ms ms mV mV %  cm m/s m/s mVms  R Peroneal - EDB     Ankle EDB 5.7 ?6.5 4.4 ?2.0 100 Ankle - EDB 9   18.3     Fib head EDB 12.3  4.2  93.7 Fib head - Ankle 29 44 ?44 17.7     Pop fossa EDB 14.5  4.1  99.4 Pop fossa - Fib head 10 44 ?44 16.8         Pop fossa - Ankle      L Peroneal - EDB     Ankle EDB 6.1 ?6.5 4.8 ?2.0 100 Ankle - EDB 9   20.6     Fib head EDB 12.8  4.5  94.4 Fib head - Ankle 29 44 ?44 19.5     Pop fossa EDB 15.1  4.6  103 Pop fossa - Fib head 10 44 ?44 19.9         Pop fossa - Ankle      R Tibial - AH     Ankle AH 4.2 ?5.8 5.8 ?4.0 100 Ankle - AH 9   13.5     Pop fossa AH 14.1  4.4  76 Pop fossa - Ankle 41 41 ?41 11.2  L Tibial - AH     Ankle AH 5.1 ?5.8 6.0 ?4.0 100 Ankle - AH 9   12.8     Pop fossa AH  14.4  4.8  79.6 Pop fossa - Ankle 39 42 ?41 13.0             SNC    Nerve / Sites Rec. Site Peak Lat Ref.  Amp Ref. Segments Distance    ms ms V V  cm  R Sural - Ankle (Calf)     Calf Ankle 3.9 ?4.4 6 ?6 Calf - Ankle 14  L Sural - Ankle (Calf)     Calf Ankle 4.0 ?4.4 8 ?6 Calf - Ankle 14  R Superficial peroneal - Ankle     Lat leg Ankle 4.1 ?4.4 7 ?6 Lat leg - Ankle 14  L Superficial peroneal - Ankle     Lat leg Ankle 4.0 ?4.4 7 ?6 Lat leg - Ankle 14             F  Wave    Nerve F Lat Ref.   ms ms  R Tibial - AH 54.6 ?56.0  L Tibial - AH  55.7 ?56.0         EMG Summary Table    Spontaneous MUAP Recruitment  Muscle IA Fib PSW Fasc Other Amp Dur. Poly Pattern  R. Vastus medialis Normal None None None _______ Normal Normal Normal Normal  R. Tibialis anterior Normal None None None _______ Normal Normal Normal Normal  R. Gastrocnemius (Medial head) Normal None None None _______ Normal Normal Normal Normal

## 2020-03-02 NOTE — Progress Notes (Signed)
NEEDLE ELECTROMYOGRAPHY:   Needle examination of right lower extremity vastus medialis,  tibialis anterior, gastrocnemius is normal.    IMPRESSION:   This is a normal study. No electrodiagnostic evidence of large  fiber neuropathy at this time.

## 2020-03-03 NOTE — Progress Notes (Signed)
Dear Jared [name]- the neuropathy is likely of small fiber origin and not detected by NCV- CD

## 2020-03-06 DIAGNOSIS — B351 Tinea unguium: Secondary | ICD-10-CM | POA: Diagnosis not present

## 2020-03-07 ENCOUNTER — Telehealth: Payer: Self-pay | Admitting: Neurology

## 2020-03-07 NOTE — Telephone Encounter (Signed)
-----   Message from Larey Seat, MD sent at 03/02/2020  4:16 PM EDT ----- NEEDLE ELECTROMYOGRAPHY:   Needle examination of right lower extremity vastus medialis,  tibialis anterior, gastrocnemius is normal.    IMPRESSION:   This is a normal study. No electrodiagnostic evidence of large  fiber neuropathy at this time.

## 2020-03-07 NOTE — Telephone Encounter (Signed)
Called the patient. There was no answer. Left a detailed message advising the NCV results were normal.  Instructed them to call back if there are any questions.  ** If pt calls back, please advise the nerve conduction study was normal. There was no evidence of neuropathy or nerve impingements.  No need to follow up unless he ask to in order to review with MD In more detail. (No apt scheduled at this time)

## 2020-03-13 ENCOUNTER — Telehealth: Payer: Self-pay | Admitting: Neurology

## 2020-03-13 DIAGNOSIS — R270 Ataxia, unspecified: Secondary | ICD-10-CM

## 2020-03-13 DIAGNOSIS — Z9181 History of falling: Secondary | ICD-10-CM

## 2020-03-13 NOTE — Telephone Encounter (Signed)
Pt states the Neurologist Dr Brett Fairy referred him to states he does not have Neuropathy after all recommended test were run.  Pt asking now what is he supposed to do, please call.

## 2020-03-14 NOTE — Telephone Encounter (Signed)
Spoke with Dr Brett Fairy and she advised that most likely chemo induced neuropathy has occurred given his cancer treatments. She states that if that is reason then that would not show on NCV. We treat his symptoms. Advised if the patient is having pain associated with this she would recommend gabapentin 100 mg at bedtime and monitor the need to increase only if needed. I will call and discuss with the patient.

## 2020-03-14 NOTE — Telephone Encounter (Signed)
  Quote from visit note: "He is now furniture and wall surfing, has had many near falls. He loses balance while standing at the kitchen counter, bending over the kitchen sink/ baisin, he falls forward but he has also felt pulled backwards, too.   He already has been in PT for balance and fall prevention, he exercises frequently at home.  It has not helped, his strength is fine, his coordination and propioception. He has to look at his feet when walking- can't walk on uneven surfaces".    He just stated  PT was not helping him. He had been in PT as of 01-25-2020(!).  Chronic back pain is not likely a part of this gait disorder.   He may benefit from a brain MRI looking at his cerebellum and cervical spine.

## 2020-03-14 NOTE — Telephone Encounter (Signed)
Called the patient back to advise of the findings.  Advised all though NCV was "normal" our goal was to see if there was anything from that standpoint that could be the cause behind his symptoms in which it was negative. Advised certainly could be related to chemo he had received. Asked the patient if he was experiencing any pain and he denied pain. He states that he just can't feel his feet beneath him which can make it difficult to get around.  Pt is currently not working with PT, advised it may be beneficial to restart with PT to have them work with him and the specific concerns that he may have. This will allow them to also assess if assistive device would be indicated. Pt verbalized understanding. Pt wandered if there could be anything like a cyst or something of concern on the spine that could be causing. I don't see any recent imaging that has been completed. Patient is inquiring if it is worth c/o MRI of back to address if there is any concern.   Informed the patient I would question with MD and see hat her thoughts are as well as ask about PT referral coming from [place] or PCP. He was appreciative.

## 2020-03-14 NOTE — Telephone Encounter (Signed)
Called the back and advised that since he had mentioned PT not working in the past she didn't see the need for ordering that again for now. She did order MRI of the brain to assess the cerebellum area due to some of the symptoms that he described. She states we could assess this area.  The patient questioned if its still worth evaluating the back, he states when he does his inversion machine during the day he has temporary relief and since this helps with spine he questions if there could be any correlation with assessing that as well. I informed him for now she has only ordered the MRI of brain. Informed I would discuss with her again and then call back if she adds that on. Informed him to be on the lookout for a call from someone to get scheduled for the other imaging.

## 2020-03-14 NOTE — Addendum Note (Signed)
Addended by: Larey Seat on: 03/14/2020 01:18 PM   Modules accepted: Orders

## 2020-03-15 ENCOUNTER — Telehealth: Payer: Self-pay | Admitting: Neurology

## 2020-03-15 NOTE — Telephone Encounter (Signed)
Mcarthur Rossetti Josem Kaufmann: [phone] (exp. 03/15/20 to 04/14/20) order sent to GI. They will reach out to the patient to schedule.

## 2020-03-17 ENCOUNTER — Ambulatory Visit
Admission: RE | Admit: 2020-03-17 | Discharge: 2020-03-17 | Disposition: A | Discharge status: Home / Self Care | Payer: Medicare PPO | Source: Ambulatory Visit | Attending: Neurology | Admitting: Neurology

## 2020-03-17 DIAGNOSIS — R270 Ataxia, unspecified: Secondary | ICD-10-CM

## 2020-03-17 DIAGNOSIS — Z9181 History of falling: Secondary | ICD-10-CM

## 2020-03-18 NOTE — Progress Notes (Signed)
IMPRESSION: This MRI of the brain without contrast shows the following: 1.   Moderate generalized cortical atrophy.   Minimal to mild cerebellar vermis atrophy is also noted. 2.   Though the temporal horns of the lateral ventricles are not significantly enlarged, there are other findings often seen with normal pressure hydrocephalus including bowing of the corpus callosum and tight sulci at the vertex.  If clinically indicated, consider this diagnosis. 3.   Multiple T2/FLAIR hyperintense foci in the hemispheres and pons.  There are large confluencies noted in the hemispheres. 4.   No acute findings.  In order to follow up on possible NPH Diagnosis, I will need to order a spinal tap with PT before and after. This makes sense if a patient has memory decline, shuffling gait and urinary control problems- the trinity of NPH symptoms.

## 2020-03-22 ENCOUNTER — Encounter: Payer: Self-pay | Admitting: Neurology

## 2020-03-22 ENCOUNTER — Telehealth: Payer: Self-pay | Admitting: Neurology

## 2020-03-22 ENCOUNTER — Ambulatory Visit (INDEPENDENT_AMBULATORY_CARE_PROVIDER_SITE_OTHER): Payer: Medicare PPO | Admitting: Neurology

## 2020-03-22 VITALS — BP 116/70 | HR 68 | Ht 70.0 in | Wt 163.0 lb

## 2020-03-22 DIAGNOSIS — G622 Polyneuropathy due to other toxic agents: Secondary | ICD-10-CM | POA: Diagnosis not present

## 2020-03-22 DIAGNOSIS — G629 Polyneuropathy, unspecified: Secondary | ICD-10-CM | POA: Diagnosis not present

## 2020-03-22 NOTE — Progress Notes (Signed)
Normal SPE pattern - Happy Birthday!      Provider:  Larey Seat, M D  Referring Provider: Bevelyn Buckles, MD Primary Care Physician:  Bevelyn Buckles, MD  Chief Complaint  Patient presents with  . Follow-up    pt alone, rm 10. presenting post MRI, NCV/EMG studies to discuss results and concerns with MD.     HPI:  Jared [name]. is a 79 y.o. male of [place] descent and  seen here on 02-09-2020 upon  a referral from Dr. 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   03-22-2020 RV and interval history _[name] underwent a nerve conduction [name] and EMG that was surprisingly normal.  No large fiber neuropathy could be proven but I do strongly suspect that he has small fiber neuropathy and he may have a dorsal spine related neuropathy.  He has no trouble with urinary incontinence except when he is at the urge to urinate, no nocturia, no enuresis. His main problem was balance and related gait problems that I attributed to his neuropathy.  And he has taken to exercising more often with the goal to stabilize his gait and balance and also to strengthen muscles overall.  His exercise tolerance has increased and he reported that he ran 1 mile today.  That was unpredictable for me when I met last with him here about 6 weeks ago.  I have also obtained neuropathy related blood tests and there is no evidence of a rheumatological condition.  An MRI of the brain was ordered and it shows that he has small vessel disease widened central sulci but also widened sulci on the outside of the brain, I believe this is ex vacuo fluid collection and not a hydrocephalus.  His memory has not been affected, he drives, he runs his own household, keeps his appointments etc. and he has not noted a significant impairment in any fine motor skills.  No problems with dressing, no changes in handwriting. This also speaks against the presence of a normal pressure  hydrocephalus.     Jared [name] reports onset of neuropathy following his treatments for colon cancer, diagnosed 15 years ago. Oxalo-platinum ws a key ingredient in the chemotherapy that followed. This concluded in the year 2006, after treatment for  several month, he immediately noted numbness in his toes and soon after fingers.  This numbness has ascendent and now he is not able to tell where in space his feet or lower leg are- he has impaired propioception. May be coincidentially, he lost hearing - now needed aids.  He is now furniture and wall surfing, has had many near falls. He loses balance while standing at the kitchen counter, bending over the kitchen sink/ baisin, he falls forward but he has also felt pulled backwards, too.   He already has been in PT for balance and fall prevention, he exercises frequently at home.  It has not helped, his strength is fine, his coordination and propioception. He has to look at his feet when walking- can't walk on uneven surfaces.   He takes B vitamins and vitamin D. Has taken many,many supplements, and has not taken the COVID vaccine (!)> he knwsmany people that had no trouble after contracting Covid.      Review of Systems: Out of a complete 14 system review, the patient complains of only the following symptoms, and all other reviewed systems are negative.   hearing loss, Ataxia, numbness, falls.  No memory loss Reports today some  urinary dribble- when under urge to urinate.  He is aware that his bladder is full.   MRI brain showed no brain cortex being pushed to the side , towards the outside - thi is ex vacuo- fluid collection.    Social History   Socioeconomic History  . Marital status: Married    Spouse name: Not on file  . Number of children: Not on file  . Years of education: Not on file  . Highest education level: Not on file  Occupational History  . Not on file  Tobacco Use  . Smoking status: Never Smoker  . Smokeless tobacco:  Never Used  Vaping Use  . Vaping Use: Never used  Substance and Sexual Activity  . Alcohol use: Yes    Alcohol/week: 1.0 standard drink    Types: 1 Glasses of wine per week  . Drug use: No  . Sexual activity: Not on file  Other Topics Concern  . Not on file  Social History Narrative  . Not on file   Social Determinants of Health   Financial Resource Strain:   . Difficulty of Paying Living Expenses: Not on file  Food Insecurity:   . Worried About Programme researcher, broadcasting/film/video in the Last Year: Not on file  . Ran Out of Food in the Last Year: Not on file  Transportation Needs:   . Lack of Transportation (Medical): Not on file  . Lack of Transportation (Non-Medical): Not on file  Physical Activity:   . Days of Exercise per Week: Not on file  . Minutes of Exercise per Session: Not on file  Stress:   . Feeling of Stress : Not on file  Social Connections:   . Frequency of Communication with Friends and Family: Not on file  . Frequency of Social Gatherings with Friends and Family: Not on file  . Attends Religious Services: Not on file  . Active Member of Clubs or Organizations: Not on file  . Attends Banker Meetings: Not on file  . Marital Status: Not on file  Intimate Partner Violence:   . Fear of Current or Ex-Partner: Not on file  . Emotionally Abused: Not on file  . Physically Abused: Not on file  . Sexually Abused: Not on file    Family History  Problem Relation Age of Onset  . Other Mother        old age  . Hodgkin's lymphoma Mother   . Heart failure Father     Past Medical History:  Diagnosis Date  . Cancer (HCC)    colon  . Hypertension   . Neuropathy due to chemotherapeutic drug [facility])     Past Surgical History:  Procedure Laterality Date  . COLON SURGERY  2004   R hemicolectomy - removed due to cancer  . COLONOSCOPY    . HERNIA REPAIR  2005    No current outpatient medications on file.   No current facility-administered medications for this  visit.    Allergies as of 03/22/2020  . (No Known Allergies)    Vitals: BP 116/70   Pulse 68   Ht 5\' 10"  (1.778 m)   Wt 163 lb (73.9 kg)   BMI 23.39 kg/m  Last Weight:  Wt Readings from Last 1 Encounters:  03/22/20 163 lb (73.9 kg)   Last Height:   Ht Readings from Last 1 Encounters:  03/22/20 5\' 10"  (1.778 m)    Physical exam:  General: The patient is awake, alert and appears not  in acute distress. The patient is well groomed. Head: Normocephalic, atraumatic. Neck is supple. Mallampati 2, neck circumference:16.5 Cardiovascular:  Regular rate and rhythm, without  murmurs or carotid bruit, and without distended neck veins. Respiratory: Lungs are clear to auscultation. Skin:  Without evidence of edema, or rash Trunk: BMI is 23.39, lost 10 pounds.   Neurologic exam : The patient is awake and alert, oriented to place and time. Memory subjective described as intact.  There is a normal attention span & concentration ability. Speech is fluent without  dysarthria, dysphonia or aphasia. Fluent speech.   Mood and affect are appropriate.  Cranial nerves: Pupils are equal and briskly reactive to light. Funduscopic exam without evidence of pallor or edema.  Extraocular movements in vertical and horizontal planes intact and without nystagmus.  Visual fields by finger perimetry are intact. Hearing to finger rub intact.  Facial sensation intact to fine touch. Facial motor strength is symmetric and tongue and uvula move midline. Tongue protrusion into either cheek is normal. Shoulder shrug is normal.  Motor exam:   Normal tone ,muscle bulk and symmetric strength in all extremities. Sensory:  Fine touch, pinprick and vibration were tested in all extremities.  The symptoms of vibration was severely impaired below both knees.  There was no detection of vibration on the chin or at the toes or ankles.  There is also no pain it is profound numbness to temperature and vibration. Proprioception was  impaired as noted in coordination testing, there was a right pronator drift that the patient was not aware of. Coordination: Rapid alternating movements in the fingers/hands were normal. Finger-to-nose maneuver impaired with evidence of of ataxia, dysmetria -but no tremor. Gait and station: Patient walks without assistive device and is able unassisted to rise, but became immediately unsteady- he walked without assistance-the patient's feet point outwards he has a wider based gait and stance.  He keeps his eyes to the feet.  And when I asked him to look straight ahead his gait became more unsteady.  I do not clearly see a propulsive or retropulsive tendency but rather a proprioception impairment in all directions.  Motor tone and core strength strength within normal limits. Stance is stable as long as wide-based . Tandem gait is impossible - Romberg testing is negative (!)  Deep tendon reflexes: in the upper extremities are symmetric and intact.  Lower extremity reflexes are present - right stronger than left - may be he wasn't fully relaxed last time. When I commented on DTR being  trace at best.  Babinski maneuver response is downgoing.   Assessment:  After physical and neurologic examination, review of laboratory studies, imaging, neurophysiology testing and pre-existing records, assessment is that of : Sensory loss to vibration/ temperature and impaired propioception. NCV and EMG , as well as neuropathy labs were suprisingly normal. Patient has been doing better with exercise tolerance and he has been running 1 mile this morning.  No reason not to exercise.  Plan:  Treatment plan and additional workup  He had a normal neuropathy lab-panel and  EMG and NCV. MRI discussed above .  Prn follow up-  Will the need to do [facility] or [facility].     Asencion Partridge Bevely Hackbart MD 03/22/2020     Addendum:  Bilateral tibial F wave latencies are normal. NEEDLE ELECTROMYOGRAPHY: Needle examination of right lower  extremity vastus medialis, tibialis anterior, gastrocnemius is normal. IMPRESSION: This is a normal [name].  No electrodiagnostic evidence of large fiber neuropathy at this time. DR.  Penumalli, MD   Addendum:    [name] DATE: 03/17/2020 PATIENT NAME: Jared [name]. DOB: 19-Sep-1940 EXAM: MRI Brain without contrast ORDERING CLINICIAN: Asencion Partridge Genea Rheaume MD CLINICAL HISTORY: 79 year old man with ataxia and falls COMPARISON FILMS: None  TECHNIQUE: MRI of the brain without contrast was obtained utilizing 5 mm axial slices with T1, T2, T2 flair, SWI and diffusion weighted views.  T1 sagittal and T2 coronal views were obtained. CONTRAST: none IMAGING SITE: [facility], [place], [place]  FINDINGS: On sagittal images, the spinal cord is imaged caudally to C3 and is normal in caliber.   The contents of the posterior fossa are of normal size and position.   The pituitary gland and optic chiasm appear normal.  There is moderate generalized cortical atrophy, mild corpus callosum atrophy and minimal to mild cerebellar vermian atrophy.  There is a bowed appearance to the corpus callosum and tight sulci near the vertex there are no abnormal extra-axial collections of fluid.   There are extensive T2/FLAIR hyperintense foci with large confluencies noted in the hemispheres, mostly in the deep white matter and to lesser extent in the periventricular and subcortical white matter.  T2 hyperintense foci also noted in the pons.  None of the foci appear to be acute..  Diffusion weighted images are normal.  Susceptibility weighted images are normal.   The orbits appear normal.   The VIIth/VIIIth nerve complex appears normal.  The mastoid air cells appear normal.  Minimal mucoperiosteal thickening in some of the ethmoid air cells.  The other paranasal sinuses appear normal.  Flow voids are identified within the major intracerebral arteries.     IMPRESSION: This MRI of the brain without contrast shows  the following: 1.   Moderate generalized cortical atrophy.   Minimal to mild cerebellar vermis atrophy is also noted. 2.   Though the temporal horns of the lateral ventricles are not significantly enlarged, there are other findings often seen with normal pressure hydrocephalus including bowing of the corpus callosum and tight sulci at the vertex.  If clinically indicated, consider this diagnosis. 3.   Multiple T2/FLAIR hyperintense foci in the hemispheres and pons.  There are large confluencies noted in the hemispheres. 4.   No acute findings.   INTERPRETING PHYSICIAN:  Richard A. Felecia Shelling, MD, PhD, Charlynn Grimes

## 2020-03-22 NOTE — Patient Instructions (Signed)
PRN as needed revisit-  If you have falls, if you find memory problems, if you are having increased urinary control problems.

## 2020-03-22 NOTE — Telephone Encounter (Signed)
Called and reviewed the MRI results with the patient. Advised there was no indication of anything that would appear concerning. Pt verbalized understanding. Pt was able to accept a follow up apt for today at 2:30 pm. He wanted to just discuss further with MD

## 2020-03-22 NOTE — Telephone Encounter (Signed)
-----   Message from Larey Seat, MD sent at 03/18/2020  3:49 PM EST ----- IMPRESSION: This MRI of the brain without contrast shows the following: 1.   Moderate generalized cortical atrophy.   Minimal to mild cerebellar vermis atrophy is also noted. 2.   Though the temporal horns of the lateral ventricles are not significantly enlarged, there are other findings often seen with normal pressure hydrocephalus including bowing of the corpus callosum and tight sulci at the vertex.  If clinically indicated, consider this diagnosis. 3.   Multiple T2/FLAIR hyperintense foci in the hemispheres and pons.  There are large confluencies noted in the hemispheres. 4.   No acute findings.  In order to follow up on possible NPH Diagnosis, I will need to order a spinal tap with PT before and after. This makes sense if a patient has memory decline, shuffling gait and urinary control problems- the trinity of NPH symptoms.

## 2020-04-06 DIAGNOSIS — E785 Hyperlipidemia, unspecified: Secondary | ICD-10-CM | POA: Diagnosis not present

## 2020-04-07 DIAGNOSIS — M19049 Primary osteoarthritis, unspecified hand: Secondary | ICD-10-CM | POA: Diagnosis not present

## 2020-05-30 DIAGNOSIS — L282 Other prurigo: Secondary | ICD-10-CM | POA: Diagnosis not present

## 2020-05-30 DIAGNOSIS — L2089 Other atopic dermatitis: Secondary | ICD-10-CM | POA: Diagnosis not present

## 2020-05-30 DIAGNOSIS — B351 Tinea unguium: Secondary | ICD-10-CM | POA: Diagnosis not present

## 2020-06-05 DIAGNOSIS — H43813 Vitreous degeneration, bilateral: Secondary | ICD-10-CM | POA: Diagnosis not present

## 2020-07-13 DIAGNOSIS — H25043 Posterior subcapsular polar age-related cataract, bilateral: Secondary | ICD-10-CM | POA: Diagnosis not present

## 2020-07-13 DIAGNOSIS — H18413 Arcus senilis, bilateral: Secondary | ICD-10-CM | POA: Diagnosis not present

## 2020-07-13 DIAGNOSIS — H25013 Cortical age-related cataract, bilateral: Secondary | ICD-10-CM | POA: Diagnosis not present

## 2020-07-13 DIAGNOSIS — H2512 Age-related nuclear cataract, left eye: Secondary | ICD-10-CM | POA: Diagnosis not present

## 2020-07-13 DIAGNOSIS — H2513 Age-related nuclear cataract, bilateral: Secondary | ICD-10-CM | POA: Diagnosis not present

## 2020-08-28 DIAGNOSIS — L820 Inflamed seborrheic keratosis: Secondary | ICD-10-CM | POA: Diagnosis not present

## 2020-08-28 DIAGNOSIS — C4441 Basal cell carcinoma of skin of scalp and neck: Secondary | ICD-10-CM | POA: Diagnosis not present

## 2020-08-28 DIAGNOSIS — B351 Tinea unguium: Secondary | ICD-10-CM | POA: Diagnosis not present

## 2020-08-28 DIAGNOSIS — L570 Actinic keratosis: Secondary | ICD-10-CM | POA: Diagnosis not present

## 2020-08-28 DIAGNOSIS — D225 Melanocytic nevi of trunk: Secondary | ICD-10-CM | POA: Diagnosis not present

## 2020-08-28 DIAGNOSIS — D485 Neoplasm of uncertain behavior of skin: Secondary | ICD-10-CM | POA: Diagnosis not present

## 2020-08-28 DIAGNOSIS — Z85828 Personal history of other malignant neoplasm of skin: Secondary | ICD-10-CM | POA: Diagnosis not present

## 2020-08-28 DIAGNOSIS — D1801 Hemangioma of skin and subcutaneous tissue: Secondary | ICD-10-CM | POA: Diagnosis not present

## 2020-08-28 DIAGNOSIS — L814 Other melanin hyperpigmentation: Secondary | ICD-10-CM | POA: Diagnosis not present

## 2020-08-28 DIAGNOSIS — L821 Other seborrheic keratosis: Secondary | ICD-10-CM | POA: Diagnosis not present

## 2020-09-25 DIAGNOSIS — H2512 Age-related nuclear cataract, left eye: Secondary | ICD-10-CM | POA: Diagnosis not present

## 2020-09-25 DIAGNOSIS — H52202 Unspecified astigmatism, left eye: Secondary | ICD-10-CM | POA: Diagnosis not present

## 2020-09-26 DIAGNOSIS — H2511 Age-related nuclear cataract, right eye: Secondary | ICD-10-CM | POA: Diagnosis not present

## 2020-10-16 DIAGNOSIS — H2511 Age-related nuclear cataract, right eye: Secondary | ICD-10-CM | POA: Diagnosis not present

## 2020-10-16 DIAGNOSIS — H52201 Unspecified astigmatism, right eye: Secondary | ICD-10-CM | POA: Diagnosis not present

## 2020-10-20 DIAGNOSIS — R7301 Impaired fasting glucose: Secondary | ICD-10-CM | POA: Diagnosis not present

## 2020-10-20 DIAGNOSIS — Z0000 Encounter for general adult medical examination without abnormal findings: Secondary | ICD-10-CM | POA: Diagnosis not present

## 2020-10-20 DIAGNOSIS — E785 Hyperlipidemia, unspecified: Secondary | ICD-10-CM | POA: Diagnosis not present

## 2020-10-27 DIAGNOSIS — R82998 Other abnormal findings in urine: Secondary | ICD-10-CM | POA: Diagnosis not present

## 2020-10-27 DIAGNOSIS — E785 Hyperlipidemia, unspecified: Secondary | ICD-10-CM | POA: Diagnosis not present

## 2020-10-27 DIAGNOSIS — B351 Tinea unguium: Secondary | ICD-10-CM | POA: Diagnosis not present

## 2020-10-27 DIAGNOSIS — Z0000 Encounter for general adult medical examination without abnormal findings: Secondary | ICD-10-CM | POA: Diagnosis not present

## 2020-10-27 DIAGNOSIS — I10 Essential (primary) hypertension: Secondary | ICD-10-CM | POA: Diagnosis not present

## 2020-10-27 DIAGNOSIS — Z1339 Encounter for screening examination for other mental health and behavioral disorders: Secondary | ICD-10-CM | POA: Diagnosis not present

## 2020-10-27 DIAGNOSIS — M19049 Primary osteoarthritis, unspecified hand: Secondary | ICD-10-CM | POA: Diagnosis not present

## 2020-10-27 DIAGNOSIS — Z1331 Encounter for screening for depression: Secondary | ICD-10-CM | POA: Diagnosis not present

## 2020-10-27 DIAGNOSIS — R739 Hyperglycemia, unspecified: Secondary | ICD-10-CM | POA: Diagnosis not present

## 2020-10-27 DIAGNOSIS — Z1212 Encounter for screening for malignant neoplasm of rectum: Secondary | ICD-10-CM | POA: Diagnosis not present

## 2020-10-27 DIAGNOSIS — G609 Hereditary and idiopathic neuropathy, unspecified: Secondary | ICD-10-CM | POA: Diagnosis not present

## 2020-11-09 DIAGNOSIS — S4292XA Fracture of left shoulder girdle, part unspecified, initial encounter for closed fracture: Secondary | ICD-10-CM | POA: Diagnosis not present

## 2020-11-09 DIAGNOSIS — S4992XA Unspecified injury of left shoulder and upper arm, initial encounter: Secondary | ICD-10-CM | POA: Diagnosis not present

## 2020-11-09 DIAGNOSIS — Z23 Encounter for immunization: Secondary | ICD-10-CM | POA: Diagnosis not present

## 2020-11-09 DIAGNOSIS — S42352A Displaced comminuted fracture of shaft of humerus, left arm, initial encounter for closed fracture: Secondary | ICD-10-CM | POA: Diagnosis not present

## 2020-11-09 DIAGNOSIS — M25512 Pain in left shoulder: Secondary | ICD-10-CM | POA: Diagnosis not present

## 2020-11-09 DIAGNOSIS — W19XXXA Unspecified fall, initial encounter: Secondary | ICD-10-CM | POA: Diagnosis not present

## 2020-11-09 DIAGNOSIS — M25412 Effusion, left shoulder: Secondary | ICD-10-CM | POA: Diagnosis not present

## 2020-11-10 DIAGNOSIS — S42295A Other nondisplaced fracture of upper end of left humerus, initial encounter for closed fracture: Secondary | ICD-10-CM | POA: Diagnosis not present

## 2020-11-13 ENCOUNTER — Other Ambulatory Visit: Payer: Medicare PPO

## 2020-11-13 ENCOUNTER — Inpatient Hospital Stay: Admission: RE | Admit: 2020-11-13 | Payer: Medicare PPO | Source: Ambulatory Visit

## 2020-11-13 ENCOUNTER — Other Ambulatory Visit: Payer: Self-pay | Admitting: Orthopaedic Surgery

## 2020-11-13 DIAGNOSIS — M25512 Pain in left shoulder: Secondary | ICD-10-CM

## 2020-11-14 ENCOUNTER — Ambulatory Visit
Admission: RE | Admit: 2020-11-14 | Discharge: 2020-11-14 | Disposition: A | Discharge status: Home / Self Care | Payer: Medicare PPO | Source: Ambulatory Visit

## 2020-11-14 ENCOUNTER — Ambulatory Visit: Payer: Medicare PPO

## 2020-11-14 ENCOUNTER — Ambulatory Visit
Admission: RE | Admit: 2020-11-14 | Discharge: 2020-11-14 | Disposition: A | Discharge status: Home / Self Care | Payer: Medicare PPO | Source: Ambulatory Visit | Attending: Orthopaedic Surgery | Admitting: Orthopaedic Surgery

## 2020-11-14 ENCOUNTER — Inpatient Hospital Stay: Admission: RE | Admit: 2020-11-14 | Payer: Medicare PPO | Source: Ambulatory Visit

## 2020-11-14 ENCOUNTER — Other Ambulatory Visit: Payer: Medicare PPO

## 2020-11-14 ENCOUNTER — Other Ambulatory Visit: Payer: Self-pay

## 2020-11-14 DIAGNOSIS — M25512 Pain in left shoulder: Secondary | ICD-10-CM | POA: Diagnosis not present

## 2020-11-14 DIAGNOSIS — S42212A Unspecified displaced fracture of surgical neck of left humerus, initial encounter for closed fracture: Secondary | ICD-10-CM | POA: Diagnosis not present

## 2020-11-14 DIAGNOSIS — Z85038 Personal history of other malignant neoplasm of large intestine: Secondary | ICD-10-CM | POA: Diagnosis not present

## 2020-11-14 DIAGNOSIS — M7989 Other specified soft tissue disorders: Secondary | ICD-10-CM | POA: Diagnosis not present

## 2020-11-21 DIAGNOSIS — S42295D Other nondisplaced fracture of upper end of left humerus, subsequent encounter for fracture with routine healing: Secondary | ICD-10-CM | POA: Diagnosis not present

## 2020-11-28 ENCOUNTER — Other Ambulatory Visit: Payer: Self-pay

## 2020-11-28 ENCOUNTER — Encounter (HOSPITAL_COMMUNITY): Payer: Self-pay | Admitting: Orthopaedic Surgery

## 2020-11-28 DIAGNOSIS — S42295D Other nondisplaced fracture of upper end of left humerus, subsequent encounter for fracture with routine healing: Secondary | ICD-10-CM | POA: Diagnosis not present

## 2020-11-28 NOTE — Progress Notes (Addendum)
COVID Vaccine Completed: No Date COVID Vaccine completed: N/A Has received booster:N/A COVID vaccine manufacturer: N/A Date of COVID positive in last 90 days: No  PCP - Bevelyn Buckles, MD Cardiologist - N/A Neurologist-[name], Asencion Partridge, MD  Chest x-ray - N/A EKG - N/A Stress Test - N/A ECHO - N/A Cardiac Cath - N/A Pacemaker/ICD device last checked:N/A  Sleep Study - N/A CPAP - N/A  Fasting Blood Sugar - 120's-140"s Checks Blood Sugar __2-3___ times a day  Blood Thinner Instructions: N/A Aspirin Instructions: N/A Last Dose: N/A  Activity level:  Can go up a flight of stairs and activities of daily living without stopping and without symptoms      Anesthesia review: N/A  Patient denies shortness of breath, fever, cough and chest pain at PAT appointment   Patient verbalized understanding of instructions that were given to them at the PAT appointment. Patient was also instructed that they will need to review over the PAT instructions again at home before surgery.

## 2020-11-28 NOTE — Anesthesia Preprocedure Evaluation (Addendum)
Anesthesia Evaluation  Patient identified by MRN, date of birth, ID band Patient awake    Reviewed: Allergy & Precautions, NPO status , Patient's Chart, lab work & pertinent test results  Airway Mallampati: II  TM Distance: >3 FB Neck ROM: Full    Dental no notable dental hx. (+) Teeth Intact, Dental Advisory Given   Pulmonary neg pulmonary ROS,    Pulmonary exam normal breath sounds clear to auscultation       Cardiovascular hypertension, Pt. on medications Normal cardiovascular exam Rhythm:Regular Rate:Normal     Neuro/Psych  Neuromuscular disease    GI/Hepatic negative GI ROS, Neg liver ROS, Colon CA   Endo/Other  negative endocrine ROS  Renal/GU      Musculoskeletal negative musculoskeletal ROS (+)   Abdominal   Peds  Hematology   Anesthesia Other Findings   Reproductive/Obstetrics                            Anesthesia Physical Anesthesia Plan  ASA: 2  Anesthesia Plan: General   Post-op Pain Management:  Regional for Post-op pain   Induction: Intravenous  PONV Risk Score and Plan: 3 and Treatment may vary due to age or medical condition, Midazolam and Ondansetron  Airway Management Planned: Oral ETT  Additional Equipment: None  Intra-op Plan:   Post-operative Plan: Extubation in OR  Informed Consent: I have reviewed the patients History and Physical, chart, labs and discussed the procedure including the risks, benefits and alternatives for the proposed anesthesia with the patient or authorized representative who has indicated his/her understanding and acceptance.     Dental advisory given  Plan Discussed with:   Anesthesia Plan Comments: (GA w L ISB)       Anesthesia Quick Evaluation

## 2020-11-29 ENCOUNTER — Ambulatory Visit (HOSPITAL_COMMUNITY): Payer: Medicare PPO | Admitting: Anesthesiology

## 2020-11-29 ENCOUNTER — Ambulatory Visit (HOSPITAL_COMMUNITY): Payer: Medicare PPO

## 2020-11-29 ENCOUNTER — Encounter (HOSPITAL_COMMUNITY)
Admission: RE | Disposition: A | Discharge status: Home / Self Care | Payer: Self-pay | Source: Home / Self Care | Attending: Orthopaedic Surgery

## 2020-11-29 ENCOUNTER — Ambulatory Visit (HOSPITAL_COMMUNITY)
Admission: RE | Admit: 2020-11-29 | Discharge: 2020-11-29 | Disposition: A | Discharge status: Home / Self Care | Payer: Medicare PPO | Attending: Orthopaedic Surgery | Admitting: Orthopaedic Surgery

## 2020-11-29 DIAGNOSIS — I10 Essential (primary) hypertension: Secondary | ICD-10-CM | POA: Insufficient documentation

## 2020-11-29 DIAGNOSIS — S42202A Unspecified fracture of upper end of left humerus, initial encounter for closed fracture: Secondary | ICD-10-CM | POA: Diagnosis not present

## 2020-11-29 DIAGNOSIS — Z85038 Personal history of other malignant neoplasm of large intestine: Secondary | ICD-10-CM | POA: Diagnosis not present

## 2020-11-29 DIAGNOSIS — C189 Malignant neoplasm of colon, unspecified: Secondary | ICD-10-CM | POA: Diagnosis not present

## 2020-11-29 DIAGNOSIS — Z791 Long term (current) use of non-steroidal anti-inflammatories (NSAID): Secondary | ICD-10-CM | POA: Diagnosis not present

## 2020-11-29 DIAGNOSIS — Z96612 Presence of left artificial shoulder joint: Secondary | ICD-10-CM | POA: Diagnosis not present

## 2020-11-29 DIAGNOSIS — Z87828 Personal history of other (healed) physical injury and trauma: Secondary | ICD-10-CM | POA: Diagnosis not present

## 2020-11-29 DIAGNOSIS — Z96642 Presence of left artificial hip joint: Secondary | ICD-10-CM | POA: Diagnosis not present

## 2020-11-29 DIAGNOSIS — Z471 Aftercare following joint replacement surgery: Secondary | ICD-10-CM | POA: Diagnosis not present

## 2020-11-29 DIAGNOSIS — W19XXXA Unspecified fall, initial encounter: Secondary | ICD-10-CM | POA: Insufficient documentation

## 2020-11-29 DIAGNOSIS — Z09 Encounter for follow-up examination after completed treatment for conditions other than malignant neoplasm: Secondary | ICD-10-CM

## 2020-11-29 DIAGNOSIS — G8918 Other acute postprocedural pain: Secondary | ICD-10-CM | POA: Diagnosis not present

## 2020-11-29 HISTORY — PX: REVERSE SHOULDER ARTHROPLASTY: SHX5054

## 2020-11-29 HISTORY — DX: Personal history of urinary calculi: Z87.442

## 2020-11-29 HISTORY — DX: Prediabetes: R73.03

## 2020-11-29 LAB — BASIC METABOLIC PANEL
Anion gap: 12 (ref 5–15)
BUN: 21 mg/dL (ref 8–23)
CO2: 25 mmol/L (ref 22–32)
Calcium: 9.7 mg/dL (ref 8.9–10.3)
Chloride: 103 mmol/L (ref 98–111)
Creatinine, Ser: 0.63 mg/dL (ref 0.61–1.24)
GFR, Estimated: >60 mL/min (ref >60)
Glucose, Bld: 120 mg/dL — ABNORMAL HIGH (ref 70–99)
Potassium: 4 mmol/L (ref 3.5–5.1)
Sodium: 140 mmol/L (ref 135–145)

## 2020-11-29 LAB — CBC
HCT: 33.2 % — ABNORMAL LOW (ref 39.0–52.0)
Hemoglobin: 10.9 g/dL — ABNORMAL LOW (ref 13.0–17.0)
MCH: 31 pg (ref 26.0–34.0)
MCHC: 32.8 g/dL (ref 30.0–36.0)
MCV: 94.3 fL (ref 80.0–100.0)
Platelets: 318 10*3/uL (ref 150–400)
RBC: 3.52 MIL/uL — ABNORMAL LOW (ref 4.22–5.81)
RDW: 12.8 % (ref 11.5–15.5)
WBC: 5 10*3/uL (ref 4.0–10.5)
nRBC: 0 % (ref 0.0–0.2)

## 2020-11-29 LAB — GLUCOSE, CAPILLARY: Glucose-Capillary: 119 mg/dL — ABNORMAL HIGH (ref 70–99)

## 2020-11-29 SURGERY — ARTHROPLASTY, SHOULDER, TOTAL, REVERSE
Anesthesia: General | Site: Shoulder | Laterality: Left

## 2020-11-29 MED ORDER — SODIUM CHLORIDE 0.9 % IV SOLN
2.0000 g | INTRAVENOUS | Status: AC
  Administered 2020-11-29: 2 g via INTRAVENOUS
  Filled 2020-11-29: qty 2

## 2020-11-29 MED ORDER — DEXAMETHASONE SODIUM PHOSPHATE 10 MG/ML IJ SOLN
INTRAMUSCULAR | Status: DC | PRN
  Administered 2020-11-29: 5 mg via INTRAVENOUS

## 2020-11-29 MED ORDER — EPHEDRINE 5 MG/ML INJ
INTRAVENOUS | Status: AC
  Filled 2020-11-29: qty 5

## 2020-11-29 MED ORDER — BUPIVACAINE HCL (PF) 0.5 % IJ SOLN
INTRAMUSCULAR | Status: DC | PRN
  Administered 2020-11-29: 12 mL via PERINEURAL

## 2020-11-29 MED ORDER — LIDOCAINE 2% (20 MG/ML) 5 ML SYRINGE
INTRAMUSCULAR | Status: DC | PRN
  Administered 2020-11-29: 100 mg via INTRAVENOUS

## 2020-11-29 MED ORDER — FENTANYL CITRATE (PF) 100 MCG/2ML IJ SOLN
50.0000 ug | INTRAMUSCULAR | Status: DC
Start: 2020-11-29 — End: 2020-11-29
  Administered 2020-11-29: 50 ug via INTRAVENOUS
  Filled 2020-11-29: qty 2

## 2020-11-29 MED ORDER — FENTANYL CITRATE (PF) 100 MCG/2ML IJ SOLN: 25.0000 ug | INTRAMUSCULAR | Status: DC | PRN

## 2020-11-29 MED ORDER — PHENYLEPHRINE HCL-NACL 10-0.9 MG/250ML-% IV SOLN
INTRAVENOUS | Status: DC | PRN
  Administered 2020-11-29: 35 ug/min via INTRAVENOUS

## 2020-11-29 MED ORDER — GABAPENTIN 300 MG PO CAPS
300.0000 mg | ORAL_CAPSULE | Freq: Once | ORAL | Status: AC
  Administered 2020-11-29: 300 mg via ORAL
  Filled 2020-11-29: qty 1

## 2020-11-29 MED ORDER — ONDANSETRON HCL 4 MG/2ML IJ SOLN: 4.0000 mg | Freq: Once | INTRAMUSCULAR | Status: DC | PRN

## 2020-11-29 MED ORDER — ACETAMINOPHEN 500 MG PO TABS
1000.0000 mg | ORAL_TABLET | Freq: Once | ORAL | Status: AC
  Administered 2020-11-29: 1000 mg via ORAL
  Filled 2020-11-29: qty 2

## 2020-11-29 MED ORDER — STERILE WATER FOR IRRIGATION IR SOLN
Status: DC | PRN
  Administered 2020-11-29: 1000 mL

## 2020-11-29 MED ORDER — OXYCODONE HCL 5 MG PO TABS: ORAL_TABLET | ORAL | 0 refills | Status: AC

## 2020-11-29 MED ORDER — VANCOMYCIN HCL 1 G IV SOLR
INTRAVENOUS | Status: DC | PRN
  Administered 2020-11-29: 1000 mg

## 2020-11-29 MED ORDER — MIDAZOLAM HCL 2 MG/2ML IJ SOLN
1.0000 mg | INTRAMUSCULAR | Status: DC
  Filled 2020-11-29: qty 2

## 2020-11-29 MED ORDER — ROCURONIUM BROMIDE 10 MG/ML (PF) SYRINGE
PREFILLED_SYRINGE | INTRAVENOUS | Status: DC | PRN
  Administered 2020-11-29: 10 mg via INTRAVENOUS
  Administered 2020-11-29: 50 mg via INTRAVENOUS
  Administered 2020-11-29: 10 mg via INTRAVENOUS

## 2020-11-29 MED ORDER — FENTANYL CITRATE (PF) 100 MCG/2ML IJ SOLN
INTRAMUSCULAR | Status: AC
  Filled 2020-11-29: qty 2

## 2020-11-29 MED ORDER — CHROMIUM 1 MG PO CAPS: 1.0000 mg | ORAL_CAPSULE | Freq: Every day | ORAL | Status: DC

## 2020-11-29 MED ORDER — TRANEXAMIC ACID-NACL 1000-0.7 MG/100ML-% IV SOLN
1000.0000 mg | INTRAVENOUS | Status: AC
  Administered 2020-11-29: 1000 mg via INTRAVENOUS
  Filled 2020-11-29: qty 100

## 2020-11-29 MED ORDER — PHENYLEPHRINE 40 MCG/ML (10ML) SYRINGE FOR IV PUSH (FOR BLOOD PRESSURE SUPPORT)
PREFILLED_SYRINGE | INTRAVENOUS | Status: AC
  Filled 2020-11-29: qty 10

## 2020-11-29 MED ORDER — 0.9 % SODIUM CHLORIDE (POUR BTL) OPTIME
TOPICAL | Status: DC | PRN
  Administered 2020-11-29: 1000 mL

## 2020-11-29 MED ORDER — LACTATED RINGERS IV SOLN: INTRAVENOUS | Status: DC

## 2020-11-29 MED ORDER — OMEPRAZOLE 20 MG PO CPDR: 20.0000 mg | DELAYED_RELEASE_CAPSULE | Freq: Every day | ORAL | 0 refills | Status: AC

## 2020-11-29 MED ORDER — PHENYLEPHRINE 40 MCG/ML (10ML) SYRINGE FOR IV PUSH (FOR BLOOD PRESSURE SUPPORT)
PREFILLED_SYRINGE | INTRAVENOUS | Status: DC | PRN
  Administered 2020-11-29: 80 ug via INTRAVENOUS

## 2020-11-29 MED ORDER — ONDANSETRON HCL 4 MG PO TABS: 4.0000 mg | ORAL_TABLET | Freq: Three times a day (TID) | ORAL | 0 refills | Status: AC | PRN

## 2020-11-29 MED ORDER — SUGAMMADEX SODIUM 200 MG/2ML IV SOLN
INTRAVENOUS | Status: DC | PRN
  Administered 2020-11-29: 200 mg via INTRAVENOUS

## 2020-11-29 MED ORDER — PROPOFOL 10 MG/ML IV BOLUS
INTRAVENOUS | Status: DC | PRN
  Administered 2020-11-29: 100 mg via INTRAVENOUS

## 2020-11-29 MED ORDER — FENTANYL CITRATE (PF) 100 MCG/2ML IJ SOLN
INTRAMUSCULAR | Status: DC | PRN
  Administered 2020-11-29: 25 ug via INTRAVENOUS
  Administered 2020-11-29: 50 ug via INTRAVENOUS
  Administered 2020-11-29: 25 ug via INTRAVENOUS

## 2020-11-29 MED ORDER — ROCURONIUM BROMIDE 10 MG/ML (PF) SYRINGE
PREFILLED_SYRINGE | INTRAVENOUS | Status: AC
  Filled 2020-11-29: qty 10

## 2020-11-29 MED ORDER — DEXAMETHASONE SODIUM PHOSPHATE 10 MG/ML IJ SOLN
INTRAMUSCULAR | Status: AC
  Filled 2020-11-29: qty 1

## 2020-11-29 MED ORDER — CHLORHEXIDINE GLUCONATE 0.12 % MT SOLN
15.0000 mL | Freq: Once | OROMUCOSAL | Status: AC
  Administered 2020-11-29: 15 mL via OROMUCOSAL

## 2020-11-29 MED ORDER — ONDANSETRON HCL 4 MG/2ML IJ SOLN
INTRAMUSCULAR | Status: AC
  Filled 2020-11-29: qty 2

## 2020-11-29 MED ORDER — ASPIRIN 81 MG PO CHEW: 81.0000 mg | CHEWABLE_TABLET | Freq: Two times a day (BID) | ORAL | 0 refills | Status: AC

## 2020-11-29 MED ORDER — CELECOXIB 200 MG PO CAPS: 200.0000 mg | ORAL_CAPSULE | Freq: Two times a day (BID) | ORAL | 0 refills | Status: AC

## 2020-11-29 MED ORDER — LIDOCAINE 2% (20 MG/ML) 5 ML SYRINGE
INTRAMUSCULAR | Status: AC
  Filled 2020-11-29: qty 5

## 2020-11-29 MED ORDER — ACETAMINOPHEN 10 MG/ML IV SOLN: 1000.0000 mg | Freq: Once | INTRAVENOUS | Status: DC | PRN

## 2020-11-29 MED ORDER — SODIUM CHLORIDE 0.9 % IR SOLN
Status: DC | PRN
  Administered 2020-11-29: 1000 mL

## 2020-11-29 MED ORDER — ONDANSETRON HCL 4 MG/2ML IJ SOLN
INTRAMUSCULAR | Status: DC | PRN
  Administered 2020-11-29: 4 mg via INTRAVENOUS

## 2020-11-29 MED ORDER — ORAL CARE MOUTH RINSE: 15.0000 mL | Freq: Once | OROMUCOSAL | Status: AC

## 2020-11-29 MED ORDER — LACTATED RINGERS IV BOLUS
500.0000 mL | Freq: Once | INTRAVENOUS | Status: AC
  Administered 2020-11-29: 500 mL via INTRAVENOUS

## 2020-11-29 MED ORDER — BUPIVACAINE LIPOSOME 1.3 % IJ SUSP
INTRAMUSCULAR | Status: DC | PRN
  Administered 2020-11-29: 10 mL via PERINEURAL

## 2020-11-29 MED ORDER — PROPOFOL 10 MG/ML IV BOLUS
INTRAVENOUS | Status: AC
  Filled 2020-11-29: qty 20

## 2020-11-29 MED ORDER — LACTATED RINGERS IV BOLUS
250.0000 mL | Freq: Once | INTRAVENOUS | Status: AC
  Administered 2020-11-29: 250 mL via INTRAVENOUS

## 2020-11-29 MED ORDER — EPHEDRINE SULFATE-NACL 50-0.9 MG/10ML-% IV SOSY
PREFILLED_SYRINGE | INTRAVENOUS | Status: DC | PRN
  Administered 2020-11-29: 10 mg via INTRAVENOUS
  Administered 2020-11-29 (×3): 5 mg via INTRAVENOUS

## 2020-11-29 MED ORDER — ACETAMINOPHEN 500 MG PO TABS: 1000.0000 mg | ORAL_TABLET | Freq: Three times a day (TID) | ORAL | 0 refills | Status: AC

## 2020-11-29 SURGICAL SUPPLY — 63 items
BAG COUNTER SPONGE SURGICOUNT (BAG) ×2 IMPLANT
BASEPLATE GLENOID STD REV 42 (Joint) ×2 IMPLANT
BASEPLATE REV SHOULDER 29 OD (Plate) ×2 IMPLANT
BIT DRILL 3.2 PERIPHERAL SCREW (BIT) ×2 IMPLANT
BLADE SAW SAG 73X25 THK (BLADE) ×1
BLADE SAW SGTL 73X25 THK (BLADE) ×1 IMPLANT
CHLORAPREP W/TINT 26 (MISCELLANEOUS) ×4 IMPLANT
CLSR STERI-STRIP ANTIMIC 1/2X4 (GAUZE/BANDAGES/DRESSINGS) ×2 IMPLANT
COOLER ICEMAN CLASSIC (MISCELLANEOUS) ×2 IMPLANT
COVER BACK TABLE 60X90IN (DRAPES) IMPLANT
COVER SURGICAL LIGHT HANDLE (MISCELLANEOUS) ×2 IMPLANT
DRAPE INCISE IOBAN 66X45 STRL (DRAPES) ×2 IMPLANT
DRAPE ORTHO SPLIT 77X108 STRL (DRAPES) ×4
DRAPE SHEET LG 3/4 BI-LAMINATE (DRAPES) ×4 IMPLANT
DRAPE SURG ORHT 6 SPLT 77X108 (DRAPES) ×2 IMPLANT
DRSG AQUACEL AG ADV 3.5X 6 (GAUZE/BANDAGES/DRESSINGS) ×2 IMPLANT
ELECT BLADE TIP CTD 4 INCH (ELECTRODE) ×2 IMPLANT
ELECT REM PT RETURN 15FT ADLT (MISCELLANEOUS) ×2 IMPLANT
FACESHIELD WRAPAROUND (MASK) ×2 IMPLANT
GLOVE SRG 8 PF TXTR STRL LF DI (GLOVE) ×1 IMPLANT
GLOVE SURG ENC MOIS LTX SZ6.5 (GLOVE) ×4 IMPLANT
GLOVE SURG LTX SZ8 (GLOVE) ×4 IMPLANT
GLOVE SURG UNDER POLY LF SZ6.5 (GLOVE) ×2 IMPLANT
GLOVE SURG UNDER POLY LF SZ8 (GLOVE) ×2
GOWN STRL REUS W/TWL LRG LVL3 (GOWN DISPOSABLE) ×2 IMPLANT
GOWN STRL REUS W/TWL XL LVL3 (GOWN DISPOSABLE) ×2 IMPLANT
GUIDEWIRE GLENOID 2.5X220 (WIRE) ×2 IMPLANT
HANDPIECE INTERPULSE COAX TIP (DISPOSABLE) ×2
HEMOSTAT SURGICEL 2X14 (HEMOSTASIS) IMPLANT
IMPL REVERSE SHOULDER 0X3.5 (Shoulder) ×1 IMPLANT
IMPLANT REVERSE SHOULDER 0X3.5 (Shoulder) ×2 IMPLANT
INSERT SHLD REV 42X6 ANGLE B (Insert) ×2 IMPLANT
KIT BASIN OR (CUSTOM PROCEDURE TRAY) ×2 IMPLANT
KIT STABILIZATION SHOULDER (MISCELLANEOUS) ×2 IMPLANT
KIT TURNOVER KIT A (KITS) ×2 IMPLANT
MANIFOLD NEPTUNE II (INSTRUMENTS) ×2 IMPLANT
NEEDLE MAYO CATGUT SZ4 (NEEDLE) ×2 IMPLANT
NS IRRIG 1000ML POUR BTL (IV SOLUTION) ×2 IMPLANT
PACK SHOULDER (CUSTOM PROCEDURE TRAY) ×2 IMPLANT
PAD COLD SHLDR WRAP-ON (PAD) ×2 IMPLANT
PENCIL SMOKE EVACUATOR (MISCELLANEOUS) IMPLANT
RESTRAINT HEAD UNIVERSAL NS (MISCELLANEOUS) ×2 IMPLANT
SCREW CENTRAL THREAD 6.5X45 (Screw) ×2 IMPLANT
SCREW PERIPHERAL 30 (Screw) ×4 IMPLANT
SET HNDPC FAN SPRY TIP SCT (DISPOSABLE) ×1 IMPLANT
SLING ULTRA II L (ORTHOPEDIC SUPPLIES) IMPLANT
SLING ULTRA III MED (ORTHOPEDIC SUPPLIES) ×2 IMPLANT
STEM HUMERAL SZ4BX104 132.5D (Joint) ×2 IMPLANT
STRIP CLOSURE SKIN 1/2X4 (GAUZE/BANDAGES/DRESSINGS) ×2 IMPLANT
SUCTION FRAZIER HANDLE 12FR (TUBING) ×2
SUCTION TUBE FRAZIER 12FR DISP (TUBING) ×1 IMPLANT
SUT ETHIBOND 2 V 37 (SUTURE) ×2 IMPLANT
SUT ETHIBOND NAB CT1 #1 30IN (SUTURE) ×2 IMPLANT
SUT FIBERWIRE #5 38 CONV NDL (SUTURE) ×2
SUT MNCRL AB 4-0 PS2 18 (SUTURE) ×2 IMPLANT
SUT VIC AB 0 CT1 36 (SUTURE) ×2 IMPLANT
SUT VIC AB 3-0 CT1 36 (SUTURE) ×2 IMPLANT
SUT VIC AB 3-0 SH 27 (SUTURE) ×4
SUT VIC AB 3-0 SH 27X BRD (SUTURE) ×2 IMPLANT
SUTURE FIBERWR #5 38 CONV NDL (SUTURE) ×1 IMPLANT
TOWEL OR 17X26 10 PK STRL BLUE (TOWEL DISPOSABLE) ×2 IMPLANT
TUBE SUCTION HIGH CAP CLEAR NV (SUCTIONS) ×2 IMPLANT
WATER STERILE IRR 1000ML POUR (IV SOLUTION) ×4 IMPLANT

## 2020-11-29 NOTE — Discharge Instructions (Signed)
Ophelia Charter MD, MPH Noemi Chapel, PA-C [place] [address] [phone] (tel)   [phone] (fax)   [place] may leave the operative dressing in place until your follow-up appointment. KEEP THE INCISIONS CLEAN AND DRY. There may be a small amount of fluid/bleeding leaking at the surgical site. This is normal after surgery.  If it fills with liquid or blood please call us immediately to change it for you. Use the provided ice machine or Ice packs as often as possible for the first 3-4 days, then as needed for pain relief.   Keep a layer of cloth or a shirt between your skin and the cooling unit to prevent frost bite as it can get very cold.  SHOWERING: - You may shower on Post-Op Day #2.  - The dressing is water resistant but do not scrub it as it may start to peel up.   - You may remove the sling for showering, but keep a water resistant pillow under the arm to keep both the  elbow and shoulder away from the body (mimicking the abduction sling).  - Gently pat the area dry.  - Do not soak the shoulder in water. Do not go swimming in the pool or ocean until your incision has completely healed (about 4-6 weeks post-operatively) - KEEP THE INCISIONS CLEAN AND DRY.  EXERCISES Wear the sling at all times You may remove the sling for showering, but keep the arm across the chest or in a secondary sling.    Accidental/Purposeful External Rotation and shoulder flexion (reaching behind you) is to be avoided at all costs for the first month. It is ok to come out of your sling if your are sitting and have assistance for eating.   Do not lift anything heavier than 1 pound until we discuss it further in clinic.  REGIONAL ANESTHESIA (NERVE BLOCKS) The anesthesia team may have performed a nerve block for you if safe in the setting of your care.  This is a great tool used to minimize pain.   Typically the block may start wearing off overnight but the long acting medicine may last for 3-4 days.  The nerve block wearing off can be a challenging period but please utilize your as needed pain medications to try and manage this period.    POST-OP MEDICATIONS- Multimodal approach to pain control In general your pain will be controlled with a combination of substances.  Prescriptions unless otherwise discussed are electronically sent to your pharmacy.  This is a carefully made plan we use to minimize narcotic use.     Celebrex - Anti-inflammatory medication taken on a scheduled basis Acetaminophen - Non-narcotic pain medicine taken on a scheduled basis  Oxycodone - This is a strong narcotic, to be used only on an "as needed" basis for SEVERE pain. Aspirin '81mg'$  - This medicine is used to minimize the risk of blood clots after surgery. Omeprazole - daily medicine to protect your stomach while taking anti-inflammatories. Zofran -  take as needed for nausea  FOLLOW-UP If you develop a Fever (>101.5), Redness or Drainage from the surgical incision site, please call our office to arrange for an evaluation. Please call the office to schedule a follow-up appointment for a wound check, 7-10 days post-operatively.  IF YOU HAVE ANY QUESTIONS, PLEASE FEEL FREE TO CALL OUR OFFICE.  HELPFUL INFORMATION  If you had a block, it will wear off between  8-24 hrs postop typically.  This is period when your pain may go from nearly zero to the pain you would have had post-op without the block.  This is an abrupt transition but nothing dangerous is happening.  You may take an extra dose of narcotic when this happens.  Your arm will be in a sling following surgery. You will be in this sling for the next 4 weeks.  I will let you know the exact duration at your follow-up visit.  You may be more comfortable sleeping in a semi-seated position the first few nights following surgery.  Keep a pillow propped under the  elbow and forearm for comfort.  If you have a recliner type of chair it might be beneficial.  If not that is fine too, but it would be helpful to sleep propped up with pillows behind your operated shoulder as well under your elbow and forearm.  This will reduce pulling on the suture lines.  When dressing, put your operative arm in the sleeve first.  When getting undressed, take your operative arm out last.  Loose fitting, button-down shirts are recommended.  In most states it is against the law to drive while your arm is in a sling. And certainly against the law to drive while taking narcotics.  You may return to work/school in the next couple of days when you feel up to it. Desk work and typing in the sling is fine.  We suggest you use the pain medication the first night prior to going to bed, in order to ease any pain when the anesthesia wears off. You should avoid taking pain medications on an empty stomach as it will make you nauseous.  Do not drink alcoholic beverages or take illicit drugs when taking pain medications.  Pain medication may make you constipated.  Below are a few solutions to try in this order: Decrease the amount of pain medication if you aren't having pain. Drink lots of decaffeinated fluids. Drink prune juice and/or each dried prunes  If the first 3 don't work start with additional solutions Take Colace - an over-the-counter stool softener Take Senokot - an over-the-counter laxative Take Miralax - a stronger over-the-counter laxative   Dental Antibiotics:  In most cases prophylactic antibiotics for Dental procdeures after total joint surgery are not necessary.  Exceptions are as follows:  1. History of prior total joint infection  2. Severely immunocompromised (Organ Transplant, cancer chemotherapy, Rheumatoid biologic meds such as Garland)  3. Poorly controlled diabetes (A1C &gt; 8.0, blood glucose over 200)  If you have one of these conditions, contact your  surgeon for an antibiotic prescription, prior to your dental procedure.   For more information including helpful videos and documents visit our website:   [email or web address]

## 2020-11-29 NOTE — Op Note (Signed)
Orthopaedic Surgery Operative Note (CSN: [id number])  Jared [name].  01-14-1941 Date of Surgery: 11/29/2020   Diagnoses:  Left proximal humerus fracture with movement of fracture fragments  Procedure: Left reverse total Shoulder Arthroplasty Left shoulder open reduction internal fixation of tuberosities   Operative Finding Successful completion of planned procedure.  Patient's bone quality was reasonable.  The shaft had medialized 5 to 8 mm compared to the head fragment.  Tuberosities were rotated around the back of the humeral head significantly.  Actual nerve tug test was difficult to perform secondary to 3-week delay in surgery and we did not dissect out and instead stayed away from areas where the axillary nerve would be present.  There was significant bony debris in the posterior inferior recess and we removed it taking care to avoid damage to the soft tissues around.  Post-operative plan: The patient will be NWB in sling.  The patient will be will be discharged from PACU if continues to be stable as was plan prior to surgery.  DVT prophylaxis Aspirin 81 mg twice daily for 6 weeks.  Pain control with PRN pain medication preferring oral medicines.  Follow up plan will be scheduled in approximately 7 days for incision check and XR.  Physical therapy to start after 4 weeks.  Implants: Tornier size 4B longstem, 0 high offset tray with a 42+6 poly-, 42 glenosphere, 29 baseplate with a 45 center screw.  Post-Op Diagnosis: Same Surgeons:Primary: Hiram Gash, MD Assistants:Caroline McBane PA-C Location: [facility] ROOM 06 Anesthesia: General with Exparel Interscalene Antibiotics: Ancef 2g preop, Vancomycin '1000mg'$  locally Tourniquet time: None Estimated Blood Loss: 123XX123 Complications: None Specimens: None Implants: Implant Name Type Inv. Item Serial No. Manufacturer Lot No. LRB No. Used Action  BASEPLATE REV SHOULDER [id number] OD - [id number] Plate BASEPLATE REV SHOULDER [id number] OD [id number]  [employer]  Left 1 Implanted  BASEPLATE GLENOID STD REV 42 - [id number] Joint BASEPLATE GLENOID STD REV 42 [id number] [employer]  Left 1 Implanted  STEM HUMERAL [id number] 132.5D - [id number] Joint STEM HUMERAL [id number] 132.5D  [employer] [id number] Left 1 Implanted  IMPLANT REVERSE SHOULDER 0X3.5 - [id number] Shoulder IMPLANT REVERSE SHOULDER 0X3.5 [id number] [employer]  Left 1 Implanted  SCREW CENTRAL THREAD 6.5X45 - [id number] Screw SCREW CENTRAL THREAD 6.5X45  [employer]  Left 1 Implanted  SCREW PERIPHERAL 30 - [id number] Screw SCREW PERIPHERAL 30  [employer]  Left 2 Implanted  INSERT SHLD REV 42X6 ANGLE B - [id number] Insert INSERT SHLD REV 42X6 ANGLE B [id number] [employer]  Left 1 Implanted    Indications for Surgery:   Jared [name]. is a 80 y.o. male with fall resulting in a complex proximal humerus fracture that had failed nonoperative treatment.  Benefits and risks of operative and nonoperative management were discussed prior to surgery with patient/guardian(s) and informed consent form was completed.  Infection and need for further surgery were discussed as was prosthetic stability and cuff issues.  We additionally specifically discussed risks of axillary nerve injury, infection, periprosthetic fracture, continued pain and longevity of implants prior to beginning procedure.      Procedure:   The patient was identified in the preoperative holding area where the surgical site was marked. Block placed by anesthesia with exparel.  The patient was taken to the OR where a procedural timeout was called and the above noted anesthesia was induced.  The patient was positioned beachchair on allen table with spider arm positioner.  Preoperative antibiotics were dosed.  The  patient's left shoulder was prepped and draped in the usual sterile fashion.  A second preoperative timeout was called.       Standard deltopectoral approach was performed with a #10 blade. We dissected down to the  subcutaneous tissues and the cephalic vein was taken laterally with the deltoid. Clavipectoral fascia was incised in line with the incision. Deep retractors were placed. The long of the biceps tendon was identified and there was significant tenosynovitis present.  Tenodesis was performed to the pectoralis tendon with #2 Ethibond. The remaining biceps was followed up into the rotator interval where it was released.    We used the bicipital groove as a landmark for the lesser and greater tuberosity fragments.  We were able to mobilize the lesser tuberosity fragment and placed stay sutures in the bone tendon junction to help with mobilization.  This point we were able to identify the  greater tuberosity fragment and 4 #5  FiberWire sutures were used to place into this for eventual repair of the tuberosities.  Once these were both mobilized we took care to identify the shaft fragment as well as the head fragment.  We carefully identified the head fragment were able to manually remove it.  At this point the axillary nerve was found and palpated and with a tug test noted to be intact.  Protected throughout the remainder of the case with blunt retractors.    We then released the SGHL with bovie cautery prior to placing a curved mayo at the junction of the anterior glenoid well above the axillary nerve and bluntly dissecting the subscapularis from the capsule.  We then carefully protected the axillary nerve as we gently released the inferior capsule to fully mobilize the subscapularis.  An anterior deltoid retractor was then placed as well as a small Hohmann retractor superiorly.   The glenoid was relatively preserved as we would expect in this fracture patient.  The remaining labrum was removed circumferentially taking great care not to disrupt the posterior capsule.    The glenoid drill guide was placed and used to drill a guide pin in the center, inferior position. The glenoid face was then reamed concentrically  over the guide wire. The center hole was drilled over the guidepin in a near anatomic angle of version. Next the glenoid vault was drilled back to a depth of 45 mm.  We tapped and then placed a 27m size baseplate with 0 lateralization was selected with a 45 mm x 6.541mlength central screw.  The base plate was screwed into the glenoid vault obtaining secure fixation. We next placed superior and inferior locking screws for additional fixation.  Next a 42 mm glenosphere was selected and impacted onto the baseplate. The center screw was tightened.   We then repositioned the arm to give access to the humeral shaft fragment.  Drill holes were placed and fiberwire sutures in the shaft for vertical fixation of the tuberosities.  We broached starting with a size one broach and broaching up to size 4 which obtained appropriate fit..  We achieved good fixation of the component in this fashion.   We trialed with multiple size tray and polyethylene options and selected a 0 high which provided good stability and range of motion without excess soft tissue tension. The offset was dialed in to match the normal anatomy. The shoulder was trialed.  There was good ROM in all planes and the shoulder was stable with no inferior translation.   We then mobilized her tuberosities  again and placed the anterior deep limbs of the 4 #5 fiber wires around the stem.  1 of these was tied down fixing the greater tuberosity in place after bone graft harvest from the humeral head component was placed underneath.  A +0 high offset tray was selected and impacted onto the stem.   A 42+6 polyethylene liner was impacted onto the stem.  The joint was reduced and thoroughly irrigated with pulsatile lavage. The remaining sutures were then placed through the subscapularis and the bone tendon junction and the tuberosities were reduced after bone graft placed beneath as autograft at the subscap.  We horizontally secured the tuberosities before placing  vertical fixation with the suture that was placed into the shaft.  Tuberosities moved as a unit were happy with her overall reduction.  This was checked on fluoroscopy confirming our position.  We irrigated copiously at this point.  Hemostasis was obtained. The deltopectoral interval was reapproximated with #1 Ethibond. The subcutaneous tissues were closed with 3-0 Vicryl and the skin was closed with running monocryl.     The wounds were cleaned and dried and an Aquacel dressing was placed. The drapes taken down. The arm was placed into sling with abduction pillow. Patient was awakened, extubated, and transferred to the recovery room in stable condition. There were no intraoperative complications. The sponge, needle, and attention counts were correct at the end of the case.     Noemi Chapel, PA-C, present and scrubbed throughout the case, critical for completion in a timely fashion, and for retraction, instrumentation, closure.

## 2020-11-29 NOTE — H&P (Signed)
PREOPERATIVE H&P  Chief Complaint: left proximal humerus fracture  HPI: Jared [name]. is a 80 y.o. male who is scheduled for Procedure(s): REVERSE SHOULDER ARTHROPLASTY.   Patient has a past medical history significant for HTN and pre-diabetes.   Patient had a fall on 11/09/20 when he fell landing on his left shoulder. He was found to have a proximal humerus fracture. We were managing the injury non-operatively. His repeat imaging showed worsening displacement of the fracture.   His symptoms are rated as moderate to severe, and have been worsening.  This is significantly impairing activities of daily living.    Please see clinic note for further details on this patient's care.    He has elected for surgical management.   Past Medical History:  Diagnosis Date   colon cancer    History of kidney stones    Hypertension    Patient denies   Neuropathy due to chemotherapeutic drug [facility])    Neurologist says doesnt have it   Pre-diabetes    Past Surgical History:  Procedure Laterality Date   CATARACT EXTRACTION, BILATERAL     COLON SURGERY  2004   R hemicolectomy - removed due to cancer   COLONOSCOPY     HERNIA REPAIR  2005   Social History   Socioeconomic History   Marital status: Married    Spouse name: Not on file   Number of children: Not on file   Years of education: Not on file   Highest education level: Not on file  Occupational History   Not on file  Tobacco Use   Smoking status: Never   Smokeless tobacco: Never  Vaping Use   Vaping Use: Never used  Substance and Sexual Activity   Alcohol use: Yes    Alcohol/week: 1.0 standard drink    Types: 1 Glasses of wine per week   Drug use: No   Sexual activity: Not on file  Other Topics Concern   Not on file  Social History Narrative   Not on file   Social Determinants of Health   Financial Resource Strain: Not on file  Food Insecurity: Not on file  Transportation Needs: Not on file  Physical Activity:  Not on file  Stress: Not on file  Social Connections: Not on file   Family History  Problem Relation Age of Onset   Other Mother        old age   Hodgkin's lymphoma Mother    Heart failure Father    No Known Allergies Prior to Admission medications   Medication Sig Start Date End Date Taking? Authorizing Provider  acetaminophen (TYLENOL) 500 MG tablet Take 500-1,000 mg by mouth every 8 (eight) hours as needed (for pain).   Yes [provider]  Alpha-Lipoic Acid 600 MG CAPS Take 600 mg by mouth daily.   Yes [provider]  AMINO ACIDS COMPLEX PO Take 2 Scoops by mouth daily.   Yes [provider]  Ascorbic Acid (VITAMIN C) 1000 MG tablet Take 1,000 mg by mouth daily.   Yes [provider]  b complex vitamins capsule Take 1 capsule by mouth daily.   Yes [provider]  Barberry-Oreg Grape-Goldenseal (BERBERINE COMPLEX PO) Take 3 capsules by mouth daily.   Yes [provider]  calcium carbonate (OS-CAL) 600 MG TABS tablet Take 600 mg by mouth daily with breakfast.   Yes [provider]  Capsicum, Cayenne, (CAYENNE PO) Take 3.4 g by mouth daily.  Yes [provider]  celecoxib (CELEBREX) 200 MG capsule Take 200 mg by mouth daily.   Yes [provider]  Cholecalciferol (VITAMIN D3) 125 MCG (5000 UT) CAPS Take 5,000 Units by mouth daily.   Yes [provider]  CHROMIUM PO Take 1,000 mg by mouth daily.   Yes [provider]  Cinnamon 500 MG capsule Take 500 mg by mouth daily.   Yes [provider]  CITRULLINE PO Take 1 Scoop by mouth daily.   Yes [provider]  Coenzyme Q10 (COQ10) 100 MG CAPS Take 100 mg by mouth daily.   Yes [provider]  COLLAGEN PO Take 1 Scoop by mouth daily.   Yes [provider]  CRANBERRY EXTRACT PO Take 1 capsule by mouth daily.   Yes [provider]  Fe Bisgly-Vit C-Vit B12-FA (GENTLE IRON PO) Take 1 capsule by mouth  2 (two) times daily with a meal.   Yes [provider]  Garlic (GARLIQUE PO) Take 2 capsules by mouth daily.   Yes [provider]  Ginger, Zingiber officinalis, (GINGER PO) Take 5 g by mouth daily.   Yes [provider]  GINSENG PO Take 100 mg by mouth daily.   Yes [provider]  Iodine, Kelp, (KELP PO) Take 225 mcg by mouth daily.   Yes [provider]  Multiple Vitamins-Minerals (MULTIVITAMIN WITH MINERALS) tablet Take 1 tablet by mouth daily.   Yes [provider]  NON FORMULARY Take 1 capsule by mouth See admin instructions. Gymnema sylvestre capsule- Take 1 capsule by mouth once a day as needed to block sugar   Yes [provider]  OVER THE COUNTER MEDICATION Take 3 capsules by mouth daily. Juniper Berry   Yes [provider]  OVER THE COUNTER MEDICATION Take 5 g by mouth See admin instructions. Beet Powder- Mix 1 heaping teaspoonful into desired beverage and drink once a day   Yes [provider]  OVER THE COUNTER MEDICATION Take 1 Scoop by mouth daily. Powdered Greens   Yes [provider]  OVER THE COUNTER MEDICATION Take 4 capsules by mouth See admin instructions. Beta Sistosterol- Take 4 capsules by mouth once a day   Yes [provider]  PAPAYA ENZYME PO Take 1 capsule by mouth daily as needed (TO AID DIGESTION).   Yes [provider]  Potassium 99 MG TABS Take 99 mg by mouth daily.   Yes [provider]  RESVERATROL PO Take 1,000 mg by mouth daily.   Yes [provider]  Saw Palmetto, Serenoa repens, (SAW PALMETTO PO) Take 540 mg by mouth daily.   Yes [provider]  Turmeric 500 MG CAPS Take 500 mg by mouth daily.   Yes [provider]  vitamin E 180 MG (400 UNITS) capsule Take 400 Units by mouth daily.   Yes [provider]  zinc gluconate 50 MG tablet Take 50 mg by mouth daily.   Yes [provider]    ROS: All other  systems have been reviewed and were otherwise negative with the exception of those mentioned in the HPI and as above.  Physical Exam: General: Alert, no acute distress Cardiovascular: No pedal edema Respiratory: No cyanosis, no use of accessory musculature GI: No organomegaly, abdomen is soft and non-tender Skin: No lesions in the area of chief complaint Neurologic: Sensation intact distally Psychiatric: Patient is competent for consent with normal mood and affect Lymphatic: No axillary or cervical lymphadenopathy  MUSCULOSKELETAL:  Left shoulder: Dressing CDI and sling well fitting,  full and painless ROM throughout hand with DPC of 0. + Motor in  AIN, PIN, Ulnar distributions. Axillary nerve sensation preserved and symmetric.  Sensation intact in medial, radial, and ulnar distributions. Well perfused digits.    Imaging: CT scan of the left shoulder demonstrated medialization of the calcar relative to the humeral shaft  Assessment: left proximal humerus fracture  Plan: Plan for Procedure(s): REVERSE SHOULDER ARTHROPLASTY  The risks benefits and alternatives were discussed with the patient including but not limited to the risks of nonoperative treatment, versus surgical intervention including infection, bleeding, nerve injury,  blood clots, cardiopulmonary complications, morbidity, mortality, among others, and they were willing to proceed.   We additionally specifically discussed risks of axillary nerve injury, infection, periprosthetic fracture, continued pain and longevity of implants prior to beginning procedure.    Patient will be closely monitored in PACU for medical stabilization and pain control. If found stable in PACU, patient may be discharged home with outpatient follow-up. If any concerns regarding patient's stabilization patient will be admitted for observation after surgery. The patient is planning to be discharged home with outpatient PT.   The patient acknowledged the  explanation, agreed to proceed with the plan and consent was signed.   Operative Plan: Left reverse total shoulder arthroplasty for fracture Discharge Medications: Standard DVT Prophylaxis: Aspirin Physical Therapy: Outpatient PT Special Discharge needs: Sling. [place], PA-C  11/29/2020 6:23 AM

## 2020-11-29 NOTE — Transfer of Care (Signed)
Immediate Anesthesia Transfer of Care Note  Patient: Jared [name]  Procedure(s) Performed: REVERSE SHOULDER ARTHROPLASTY (Left: Shoulder)  Patient Location: PACU  Anesthesia Type:General and Regional  Level of Consciousness: sedated  Airway & Oxygen Therapy: Patient Spontanous Breathing and Patient connected to face mask oxygen  Post-op Assessment: Report given to RN and Post -op Vital signs reviewed and stable  Post vital signs: Reviewed and stable  Last Vitals:  Vitals Value Taken Time  BP 145/87 11/29/20 1136  Temp    Pulse 76 11/29/20 1137  Resp 13 11/29/20 1137  SpO2 100 % 11/29/20 1137  Vitals shown include unvalidated device data.  Last Pain:  Vitals:   11/29/20 0846  TempSrc: Oral  PainSc:       Patients Stated Pain Goal: 3 ([id number])  Complications: No notable events documented.

## 2020-11-29 NOTE — Anesthesia Procedure Notes (Signed)
Procedure Name: Intubation Date/Time: 11/29/2020 9:49 AM Performed by: Maxwell Caul, CRNA Pre-anesthesia Checklist: Patient identified, Emergency Drugs available, Suction available and Patient being monitored Patient Re-evaluated:Patient Re-evaluated prior to induction Oxygen Delivery Method: Circle system utilized Preoxygenation: Pre-oxygenation with 100% oxygen Induction Type: IV induction Ventilation: Mask ventilation without difficulty Laryngoscope Size: Mac and 4 Grade View: Grade I Tube type: Oral Tube size: 7.5 mm Number of attempts: 1 Airway Equipment and Method: Stylet Placement Confirmation: ETT inserted through vocal cords under direct vision, positive ETCO2 and breath sounds checked- equal and bilateral Secured at: 22 cm Tube secured with: Tape Dental Injury: Teeth and Oropharynx as per pre-operative assessment

## 2020-11-29 NOTE — Progress Notes (Signed)
Assisted Dr. Valma Cava with left, ultrasound guided, interscalene  block. Side rails up, monitors on throughout procedure. See vital signs in flow sheet. Tolerated Procedure well.

## 2020-11-29 NOTE — Anesthesia Procedure Notes (Signed)
Anesthesia Regional Block: Interscalene brachial plexus block   Pre-Anesthetic Checklist: , timeout performed,  Correct Patient, Correct Site, Correct Laterality,  Correct Procedure, Correct Position, site marked,  Risks and benefits discussed,  Surgical consent,  Pre-op evaluation,  At surgeon's request and post-op pain management  Laterality: Upper and Left  Prep: Maximum Sterile Barrier Precautions used, chloraprep       Needles:  Injection technique: Single-shot  Needle Type: Echogenic Needle     Needle Length: 5cm  Needle Gauge: 21     Additional Needles:   Procedures:,,,, ultrasound used (permanent image in chart),,    Narrative:  Start time: 11/29/2020 8:52 AM End time: 11/29/2020 9:00 AM Injection made incrementally with aspirations every 5 mL.  Performed by: Personally  Anesthesiologist: Barnet Glasgow, MD  Additional Notes: Block assessed prior to procedure. Patient tolerated procedure well.

## 2020-11-29 NOTE — Interval H&P Note (Signed)
All questions answered. Patient ring cannot be removed, discussed cutting it off vs tape, he understands risk of burn and elected for tape.

## 2020-11-29 NOTE — Anesthesia Postprocedure Evaluation (Signed)
Anesthesia Post Note  Patient: Jared Gago Tomma [name]  Procedure(s) Performed: REVERSE SHOULDER ARTHROPLASTY (Left: Shoulder)     Patient location during evaluation: PACU Anesthesia Type: General Level of consciousness: awake and alert Pain management: pain level controlled Vital Signs Assessment: post-procedure vital signs reviewed and stable Respiratory status: spontaneous breathing, nonlabored ventilation, respiratory function stable and patient connected to nasal cannula oxygen Cardiovascular status: blood pressure returned to baseline and stable Postop Assessment: no apparent nausea or vomiting Anesthetic complications: no   No notable events documented.  Last Vitals:  Vitals:   11/29/20 1215 11/29/20 1228  BP: 134/75 138/89  Pulse: 71 73  Resp: 16 16  Temp: 36.4 C 36.5 C  SpO2: 96% 98%    Last Pain:  Vitals:   11/29/20 1228  TempSrc:   PainSc: 0-No pain                 Barnet Glasgow

## 2020-11-30 ENCOUNTER — Encounter (HOSPITAL_COMMUNITY): Payer: Self-pay | Admitting: Orthopaedic Surgery

## 2020-11-30 LAB — HEMOGLOBIN A1C
Hgb A1c MFr Bld: 6.1 % — ABNORMAL HIGH (ref 4.8–5.6)
Mean Plasma Glucose: 128 mg/dL

## 2020-12-08 DIAGNOSIS — S42202D Unspecified fracture of upper end of left humerus, subsequent encounter for fracture with routine healing: Secondary | ICD-10-CM | POA: Diagnosis not present

## 2020-12-19 ENCOUNTER — Encounter (HOSPITAL_COMMUNITY): Payer: Self-pay | Admitting: Orthopaedic Surgery

## 2020-12-29 DIAGNOSIS — S42202D Unspecified fracture of upper end of left humerus, subsequent encounter for fracture with routine healing: Secondary | ICD-10-CM | POA: Diagnosis not present

## 2021-01-01 DIAGNOSIS — M25612 Stiffness of left shoulder, not elsewhere classified: Secondary | ICD-10-CM | POA: Diagnosis not present

## 2021-01-01 DIAGNOSIS — Z96612 Presence of left artificial shoulder joint: Secondary | ICD-10-CM | POA: Diagnosis not present

## 2021-01-01 DIAGNOSIS — R531 Weakness: Secondary | ICD-10-CM | POA: Diagnosis not present

## 2021-01-05 DIAGNOSIS — R531 Weakness: Secondary | ICD-10-CM | POA: Diagnosis not present

## 2021-01-05 DIAGNOSIS — M25612 Stiffness of left shoulder, not elsewhere classified: Secondary | ICD-10-CM | POA: Diagnosis not present

## 2021-01-05 DIAGNOSIS — Z96612 Presence of left artificial shoulder joint: Secondary | ICD-10-CM | POA: Diagnosis not present

## 2021-01-10 DIAGNOSIS — R531 Weakness: Secondary | ICD-10-CM | POA: Diagnosis not present

## 2021-01-10 DIAGNOSIS — Z96612 Presence of left artificial shoulder joint: Secondary | ICD-10-CM | POA: Diagnosis not present

## 2021-01-10 DIAGNOSIS — M25612 Stiffness of left shoulder, not elsewhere classified: Secondary | ICD-10-CM | POA: Diagnosis not present

## 2021-01-12 DIAGNOSIS — M25612 Stiffness of left shoulder, not elsewhere classified: Secondary | ICD-10-CM | POA: Diagnosis not present

## 2021-01-12 DIAGNOSIS — Z96612 Presence of left artificial shoulder joint: Secondary | ICD-10-CM | POA: Diagnosis not present

## 2021-01-12 DIAGNOSIS — R531 Weakness: Secondary | ICD-10-CM | POA: Diagnosis not present

## 2021-01-17 DIAGNOSIS — Z96612 Presence of left artificial shoulder joint: Secondary | ICD-10-CM | POA: Diagnosis not present

## 2021-01-17 DIAGNOSIS — R531 Weakness: Secondary | ICD-10-CM | POA: Diagnosis not present

## 2021-01-17 DIAGNOSIS — M25612 Stiffness of left shoulder, not elsewhere classified: Secondary | ICD-10-CM | POA: Diagnosis not present

## 2021-01-19 DIAGNOSIS — M25612 Stiffness of left shoulder, not elsewhere classified: Secondary | ICD-10-CM | POA: Diagnosis not present

## 2021-01-19 DIAGNOSIS — R531 Weakness: Secondary | ICD-10-CM | POA: Diagnosis not present

## 2021-01-24 DIAGNOSIS — R531 Weakness: Secondary | ICD-10-CM | POA: Diagnosis not present

## 2021-01-24 DIAGNOSIS — M25612 Stiffness of left shoulder, not elsewhere classified: Secondary | ICD-10-CM | POA: Diagnosis not present

## 2021-01-24 DIAGNOSIS — Z96612 Presence of left artificial shoulder joint: Secondary | ICD-10-CM | POA: Diagnosis not present

## 2021-01-29 DIAGNOSIS — M25612 Stiffness of left shoulder, not elsewhere classified: Secondary | ICD-10-CM | POA: Diagnosis not present

## 2021-01-29 DIAGNOSIS — R531 Weakness: Secondary | ICD-10-CM | POA: Diagnosis not present

## 2021-01-29 DIAGNOSIS — Z96612 Presence of left artificial shoulder joint: Secondary | ICD-10-CM | POA: Diagnosis not present

## 2021-01-31 ENCOUNTER — Encounter: Payer: Self-pay | Admitting: Neurology

## 2021-02-02 DIAGNOSIS — M25612 Stiffness of left shoulder, not elsewhere classified: Secondary | ICD-10-CM | POA: Diagnosis not present

## 2021-02-02 DIAGNOSIS — Z96612 Presence of left artificial shoulder joint: Secondary | ICD-10-CM | POA: Diagnosis not present

## 2021-02-02 DIAGNOSIS — R531 Weakness: Secondary | ICD-10-CM | POA: Diagnosis not present

## 2021-02-05 ENCOUNTER — Telehealth: Payer: Self-pay | Admitting: Neurology

## 2021-02-05 ENCOUNTER — Encounter: Payer: Self-pay | Admitting: Neurology

## 2021-02-05 ENCOUNTER — Ambulatory Visit (INDEPENDENT_AMBULATORY_CARE_PROVIDER_SITE_OTHER): Payer: Medicare PPO | Admitting: Neurology

## 2021-02-05 ENCOUNTER — Other Ambulatory Visit: Payer: Self-pay | Admitting: Neurology

## 2021-02-05 VITALS — BP 111/74 | HR 79 | Ht 70.0 in | Wt 163.5 lb

## 2021-02-05 DIAGNOSIS — G622 Polyneuropathy due to other toxic agents: Secondary | ICD-10-CM | POA: Diagnosis not present

## 2021-02-05 DIAGNOSIS — Z9181 History of falling: Secondary | ICD-10-CM

## 2021-02-05 DIAGNOSIS — G912 (Idiopathic) normal pressure hydrocephalus: Secondary | ICD-10-CM | POA: Diagnosis not present

## 2021-02-05 DIAGNOSIS — G629 Polyneuropathy, unspecified: Secondary | ICD-10-CM

## 2021-02-05 DIAGNOSIS — R270 Ataxia, unspecified: Secondary | ICD-10-CM

## 2021-02-05 NOTE — Telephone Encounter (Signed)
Called the patient and there was no answer. Left a detailed message advising the order was placed for the patient to have a Lumbar puncture completed. This test would be completed at [facility] radiology department. Advised someone should be in touch soon to schedule but if they dont call the LP scheduler at [phone]. They will get him scheduled to have that test completed there.  Advised on VM for the patient to either call or send a mychart message to me letting me know once the apt is made and date and time of the appointment. Once I have that information, I will call the rehab department and speak with Jarrett Soho at [phone] to determine getting the pre and post LP schedule set up.  Instructed the pt to call back with questions.

## 2021-02-05 NOTE — Telephone Encounter (Signed)
Handicap placard form given to MD for signature, pt will pick up when completed.

## 2021-02-05 NOTE — Progress Notes (Signed)
Provider:  Larey Seat, M D  Referring Provider: Reynold Bowen, MD Primary Care Physician:  Bevelyn Buckles, MD  Chief Complaint  Patient presents with   New Patient (Initial Visit)    RM 46 with wife. Ambulates w/ cane. Has ongoing dizziness/falls. Gets dizzy when he looks to the ceiling. [place] 11/09/20 and broke left shoulder. Had to have surgery. Has had several near falls.    HPI:  Jared [name] seen here on 02-09-2020 upon a referral from Dr. 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  RV 02-05-2021: This time he enters the room with a 4 pronged cane. He fell July 4th after attending a conference.  Days of poor balance and days of lesser strength. Had fractured the left shoulder and needed a replacement. Titanium now.   Has severely declined in motor skills.        02-08-2020  Jared [name] reports onset of neuropathy following his treatments for colon cancer, diagnosed 15 years ago. Oxalo-platinum ws a key ingredient in the chemotherapy that followed. This concluded in the year 2006, after treatment for  several month, he immediately noted numbness in his toes and soon after fingers.  This numbness has ascendent and now he is not able to tell where in space his feet or lower leg are- he has impaired propioception. May be coincidentially, he lost hearing - now needed aids.   He is now furniture and wall surfing, has had many near falls. He loses balance while standing at the kitchen counter, bending over the kitchen sink/ baisin, he falls forward but he has also felt pulled backwards, too.   He already has been in PT for balance and fall prevention, he exercises frequently at home.  It has not helped, his strength is fine, his coordination and propioception. He has to look at his feet when walking- can't walk on uneven surfaces.   He takes B vitamins and vitamin D. Has taken many,many  supplements, and has not taken the COVID vaccine (!)>     Review of Systems: Out of a complete 14 system review, the patient complains of only the following symptoms, and all other reviewed systems are negative.   hearing loss, Ataxia, numbness, falls.    Social History   Socioeconomic History   Marital status: Married    Spouse name: Not on file   Number of children: Not on file   Years of education: Not on file   Highest education level: Not on file  Occupational History   Not on file  Tobacco Use   Smoking status: Never   Smokeless tobacco: Never  Vaping Use   Vaping Use: Never used  Substance and Sexual Activity   Alcohol use: Yes    Alcohol/week: 1.0 standard drink    Types: 1 Glasses of wine per week   Drug use: No   Sexual activity: Not on file  Other Topics Concern   Not on file  Social History Narrative   Not on file   Social Determinants of Health   Financial Resource Strain: Not on file  Food Insecurity: Not on file  Transportation Needs: Not on file  Physical Activity: Not on file  Stress: Not on file  Social Connections: Not on file  Intimate Partner Violence: Not on file    Family History  Problem Relation Age of Onset   Other Mother  old age   Hodgkin's lymphoma Mother    Heart failure Father     Past Medical History:  Diagnosis Date   colon cancer    History of kidney stones    Hypertension    Patient denies   Neuropathy due to chemotherapeutic drug [facility])    Neurologist says doesnt have it   Pre-diabetes     Past Surgical History:  Procedure Laterality Date   CATARACT EXTRACTION, BILATERAL     COLON SURGERY  2004   R hemicolectomy - removed due to cancer   COLONOSCOPY     HEMICOLECTOMY     HERNIA REPAIR  2005   REVERSE SHOULDER ARTHROPLASTY Left 11/29/2020   Procedure: REVERSE SHOULDER ARTHROPLASTY;  Surgeon: Hiram Gash, MD;  Location: [facility];  Service: Orthopedics;  Laterality: Left;    Current Outpatient  Medications  Medication Sig Dispense Refill   Alpha-Lipoic Acid 600 MG CAPS Take 600 mg by mouth daily.     AMINO ACIDS COMPLEX PO Take 2 Scoops by mouth daily.     Ascorbic Acid (VITAMIN C) 1000 MG tablet Take 1,000 mg by mouth daily.     b complex vitamins capsule Take 1 capsule by mouth daily.     Barberry-Oreg Grape-Goldenseal (BERBERINE COMPLEX PO) Take 3 capsules by mouth daily.     calcium carbonate (OS-CAL) 600 MG TABS tablet Take 600 mg by mouth daily with breakfast.     Capsicum, Cayenne, (CAYENNE PO) Take 3.4 g by mouth daily.     Cholecalciferol (VITAMIN D3) 125 MCG (5000 UT) CAPS Take 5,000 Units by mouth daily.     Chromium 1 MG CAPS Take 1 capsule (1 mg total) by mouth daily.     Cinnamon 500 MG capsule Take 500 mg by mouth daily.     CITRULLINE PO Take 1 Scoop by mouth daily.     Coenzyme Q10 (COQ10) 100 MG CAPS Take 100 mg by mouth daily.     COLLAGEN PO Take 1 Scoop by mouth daily.     CRANBERRY EXTRACT PO Take 1 capsule by mouth daily.     Fe Bisgly-Vit C-Vit B12-FA (GENTLE IRON PO) Take 1 capsule by mouth 2 (two) times daily with a meal.     Garlic (GARLIQUE PO) Take 2 capsules by mouth daily.     Ginger, Zingiber officinalis, (GINGER PO) Take 5 g by mouth daily.     GINSENG PO Take 100 mg by mouth daily.     Iodine, Kelp, (KELP PO) Take 225 mcg by mouth daily.     NON FORMULARY Take 1 capsule by mouth See admin instructions. Gymnema sylvestre capsule- Take 1 capsule by mouth once a day as needed to block sugar     OVER THE COUNTER MEDICATION Take 3 capsules by mouth daily. Juniper Berry     OVER THE COUNTER MEDICATION Take 5 g by mouth See admin instructions. Beet Powder- Mix 1 heaping teaspoonful into desired beverage and drink once a day     OVER THE COUNTER MEDICATION Take 1 Scoop by mouth daily. Powdered Greens     OVER THE COUNTER MEDICATION Take 4 capsules by mouth See admin instructions. Beta Sistosterol- Take 4 capsules by mouth once a day     PAPAYA ENZYME PO  Take 1 capsule by mouth daily as needed (TO AID DIGESTION).     Potassium 99 MG TABS Take 99 mg by mouth daily.     RESVERATROL PO Take 1,000 mg by mouth daily.  Saw Palmetto, Serenoa repens, (SAW PALMETTO PO) Take 540 mg by mouth daily.     Turmeric 500 MG CAPS Take 500 mg by mouth daily.     vitamin E 180 MG (400 UNITS) capsule Take 400 Units by mouth daily.     zinc gluconate 50 MG tablet Take 50 mg by mouth daily.     No current facility-administered medications for this visit.    Allergies as of 02/05/2021   (No Known Allergies)    Vitals: BP 111/74 (BP Location: Left Arm, Patient Position: Sitting, Cuff Size: Normal)   Pulse 79   Ht 5\' 10"  (1.778 m)   Wt 163 lb 8 oz (74.2 kg)   BMI 23.46 kg/m  Last Weight:  Wt Readings from Last 1 Encounters:  02/05/21 163 lb 8 oz (74.2 kg)   Last Height:   Ht Readings from Last 1 Encounters:  02/05/21 5\' 10"  (1.778 m)    Physical exam:  General: The patient is awake, alert and appears not in acute distress. The patient is well groomed. Head: Normocephalic, atraumatic. Neck is supple. Mallampati 2, neck circumference:16.5 Cardiovascular:  Regular rate and rhythm, without  murmurs or carotid bruit, and without distended neck veins. Respiratory: Lungs are clear to auscultation. Skin:  Without evidence of edema, or rash Trunk: BMI is 25   Neurologic exam : The patient is awake and alert, oriented to place and time.  Memory subjective  described as intact. There is a normal attention span & concentration ability. Speech is fluent without  dysarthria, dysphonia or aphasia. Mood and affect are appropriate.  Cranial nerves: Pupils are equal and briskly reactive to light. Funduscopic exam without evidence of pallor or edema.  Extraocular movements in vertical and horizontal planes intact and without nystagmus.  Visual fields by finger perimetry are intact. Hearing to finger rub intact.  Facial sensation intact to fine touch. Facial  motor strength is symmetric and tongue and uvula move midline. Tongue protrusion into either cheek is normal. Shoulder shrug is normal.   Motor exam:   Normal tone ,muscle bulk and symmetric strength in all extremities.  Sensory:  Fine touch, pinprick and vibration were tested in all extremities.  The symptoms of vibration was severely impaired below both knees.  There was no detection of vibration on the chin or at the toes or ankles.  There is also no pain it is profound numbness to temperature and vibration. Proprioception was impaired as noted in coordination testing, there was a right pronator drift that the patient was not aware of.  Coordination: Rapid alternating movements in the fingers/hands were normal. Finger-to-nose maneuver impaired with evidence of of ataxia, dysmetria -but no tremor.  Gait and station: Patient walks now with assistive device and is barely able unassisted to rise, but became immediately unsteady- he walked with very small steps, very wide based gait and stance.  He keeps his eyes to the feet.  And when I asked him to look straight ahead his gait became more unsteady. He appears stiff, can't turn- turning required 8 steps, tiny tipple steps.   I do not clearly see a propulsive or retropulsive tendency but rather a proprioception impairment in all directions.   Tandem gait is impossible - high fall risk (!)  Deep tendon reflexes: in the upper extremities are absent, while I could elicit them last visit-   Lower extremity reflexes are trace at best.  Babinski maneuver deferred.    Assessment:  After physical and neurologic examination, review  of laboratory studies, imaging, IMPRESSION: This MRI of the brain without contrast shows the following: 1.   Moderate generalized cortical atrophy.   Minimal to mild cerebellar vermis atrophy is also noted. 2.   Though the temporal horns of the lateral ventricles are not significantly enlarged, there are other findings often seen  with normal pressure hydrocephalus including bowing of the corpus callosum and tight sulci at the vertex.  If clinically indicated, consider this diagnosis. 3.   Multiple T2/FLAIR hyperintense foci in the hemispheres and pons.  There are large confluencies noted in the hemispheres. 4.   No acute findings.       INTERPRETING PHYSICIAN:  Richard A. Felecia Shelling, MD, PhD, FAAN  neurophysiology testing and pre-existing records, assessment is that of :  Sensory loss to vibration/ temperature and impaired propioception.  Very small steps and unsteady gait, magnetic feet. High fall risk. This patient is a former runner !   Plan:  Treatment plan and additional workup :  Severe gait disorder, I would consider MAGNETIC gait . Worsening urinary control. No spasticity. Possibility of NPH. More commonly, magnetic gait is due to chronic severe microvascular brain ischemic disease, in which case it does not improve with shunting.  Parkinsonism is the clinical constellation of tremor, bradykinesia, and rigidity, I did not see here.   BRAIN MRI with and without contrast.     Larey Seat MD 02/05/2021

## 2021-02-06 LAB — COMPREHENSIVE METABOLIC PANEL
ALT: 10 IU/L (ref 0–44)
AST: 18 IU/L (ref 0–40)
Albumin/Globulin Ratio: 1.8 (ref 1.2–2.2)
Albumin: 4.5 g/dL (ref 3.7–4.7)
Alkaline Phosphatase: 86 IU/L (ref 44–121)
BUN/Creatinine Ratio: 26 — ABNORMAL HIGH (ref 10–24)
BUN: 23 mg/dL (ref 8–27)
Bilirubin Total: <0.2 mg/dL (ref 0.0–1.2)
CO2: 26 mmol/L (ref 20–29)
Calcium: 9.8 mg/dL (ref 8.6–10.2)
Chloride: 104 mmol/L (ref 96–106)
Creatinine, Ser: 0.89 mg/dL (ref 0.76–1.27)
Globulin, Total: 2.5 g/dL (ref 1.5–4.5)
Glucose: 132 mg/dL — ABNORMAL HIGH (ref 70–99)
Potassium: 4.8 mmol/L (ref 3.5–5.2)
Sodium: 143 mmol/L (ref 134–144)
Total Protein: 7 g/dL (ref 6.0–8.5)
eGFR: 88 mL/min/{1.73_m2} (ref >59)

## 2021-02-06 NOTE — Telephone Encounter (Signed)
Placed completed form up front for pick up. CB,RN will call pt about LP scheduling/let him know this form is ready for pick up.

## 2021-02-07 ENCOUNTER — Encounter: Payer: Self-pay | Admitting: Neurology

## 2021-02-07 NOTE — Progress Notes (Signed)
High BUN is indicative of low hydration or dehydration, glucose was non-fast.  All normal.   Cc Dr Philip Aspen

## 2021-02-08 NOTE — Telephone Encounter (Addendum)
Called the patient to review labs there was no answer. LVM informing him of information and that he needs to call once LP scheduled  ----- Message from Larey Seat, MD sent at 02/07/2021  4:08 PM EDT ----- High BUN is indicative of low hydration or dehydration, glucose was non-fast.  All normal.   Cc Dr Philip Aspen

## 2021-02-09 DIAGNOSIS — Z96612 Presence of left artificial shoulder joint: Secondary | ICD-10-CM | POA: Diagnosis not present

## 2021-02-09 DIAGNOSIS — M25612 Stiffness of left shoulder, not elsewhere classified: Secondary | ICD-10-CM | POA: Diagnosis not present

## 2021-02-09 DIAGNOSIS — R531 Weakness: Secondary | ICD-10-CM | POA: Diagnosis not present

## 2021-02-12 DIAGNOSIS — Z96612 Presence of left artificial shoulder joint: Secondary | ICD-10-CM | POA: Diagnosis not present

## 2021-02-12 DIAGNOSIS — R531 Weakness: Secondary | ICD-10-CM | POA: Diagnosis not present

## 2021-02-12 DIAGNOSIS — M25612 Stiffness of left shoulder, not elsewhere classified: Secondary | ICD-10-CM | POA: Diagnosis not present

## 2021-02-14 NOTE — Telephone Encounter (Signed)
Hospital has called patient twice and LVM to schedule this procedure x2, 10/5 and 10/7 per scheduler documentation.

## 2021-02-16 DIAGNOSIS — M25612 Stiffness of left shoulder, not elsewhere classified: Secondary | ICD-10-CM | POA: Diagnosis not present

## 2021-02-16 DIAGNOSIS — R531 Weakness: Secondary | ICD-10-CM | POA: Diagnosis not present

## 2021-02-19 DIAGNOSIS — R531 Weakness: Secondary | ICD-10-CM | POA: Diagnosis not present

## 2021-02-19 DIAGNOSIS — M25612 Stiffness of left shoulder, not elsewhere classified: Secondary | ICD-10-CM | POA: Diagnosis not present

## 2021-02-23 DIAGNOSIS — R531 Weakness: Secondary | ICD-10-CM | POA: Diagnosis not present

## 2021-02-23 DIAGNOSIS — Z96612 Presence of left artificial shoulder joint: Secondary | ICD-10-CM | POA: Diagnosis not present

## 2021-02-23 DIAGNOSIS — M25612 Stiffness of left shoulder, not elsewhere classified: Secondary | ICD-10-CM | POA: Diagnosis not present

## 2021-02-26 DIAGNOSIS — M25612 Stiffness of left shoulder, not elsewhere classified: Secondary | ICD-10-CM | POA: Diagnosis not present

## 2021-02-26 DIAGNOSIS — Z96612 Presence of left artificial shoulder joint: Secondary | ICD-10-CM | POA: Diagnosis not present

## 2021-02-26 DIAGNOSIS — R531 Weakness: Secondary | ICD-10-CM | POA: Diagnosis not present

## 2021-03-02 DIAGNOSIS — M25512 Pain in left shoulder: Secondary | ICD-10-CM | POA: Diagnosis not present

## 2021-03-02 DIAGNOSIS — M25612 Stiffness of left shoulder, not elsewhere classified: Secondary | ICD-10-CM | POA: Diagnosis not present

## 2021-03-02 DIAGNOSIS — Z96612 Presence of left artificial shoulder joint: Secondary | ICD-10-CM | POA: Diagnosis not present

## 2021-03-02 DIAGNOSIS — R531 Weakness: Secondary | ICD-10-CM | POA: Diagnosis not present

## 2021-03-08 ENCOUNTER — Ambulatory Visit
Admission: RE | Admit: 2021-03-08 | Discharge: 2021-03-08 | Disposition: A | Discharge status: Home / Self Care | Payer: Medicare PPO | Source: Ambulatory Visit | Attending: Neurology | Admitting: Neurology

## 2021-03-08 ENCOUNTER — Other Ambulatory Visit: Payer: Self-pay

## 2021-03-08 DIAGNOSIS — G622 Polyneuropathy due to other toxic agents: Secondary | ICD-10-CM

## 2021-03-08 DIAGNOSIS — G912 (Idiopathic) normal pressure hydrocephalus: Secondary | ICD-10-CM | POA: Diagnosis not present

## 2021-03-08 DIAGNOSIS — Z9181 History of falling: Secondary | ICD-10-CM

## 2021-03-08 MED ORDER — GADOBENATE DIMEGLUMINE 529 MG/ML IV SOLN
15.0000 mL | Freq: Once | INTRAVENOUS | Status: AC | PRN
  Administered 2021-03-08: 15 mL via INTRAVENOUS

## 2021-03-09 DIAGNOSIS — Z96612 Presence of left artificial shoulder joint: Secondary | ICD-10-CM | POA: Diagnosis not present

## 2021-03-09 DIAGNOSIS — R531 Weakness: Secondary | ICD-10-CM | POA: Diagnosis not present

## 2021-03-09 DIAGNOSIS — M25612 Stiffness of left shoulder, not elsewhere classified: Secondary | ICD-10-CM | POA: Diagnosis not present

## 2021-03-09 NOTE — Progress Notes (Signed)
Cc TO DR Leanna Battles, MD please.   ATAXIA:   MRI brain (with and without) demonstrating: - Moderate brain atrophy and moderate ventriculomegaly on ex vacuo basis- (this mans there is no fluid pressure on the brain, the brain is shrinking and fluid filled the void).  - Moderate chronic small vessel ischemic disease- this is also an aging relate finding.  Small brain vessels have reduced blood flow. - No acute findings. - Compared to MRI from 03/17/20, there has been very subtle progression of atrophy and ventriculomegaly.  This MRI result explains some delayed motor reaction / impaired balance.

## 2021-03-16 DIAGNOSIS — M25612 Stiffness of left shoulder, not elsewhere classified: Secondary | ICD-10-CM | POA: Diagnosis not present

## 2021-03-16 DIAGNOSIS — R531 Weakness: Secondary | ICD-10-CM | POA: Diagnosis not present

## 2021-03-16 DIAGNOSIS — Z96612 Presence of left artificial shoulder joint: Secondary | ICD-10-CM | POA: Diagnosis not present

## 2021-03-28 DIAGNOSIS — M25612 Stiffness of left shoulder, not elsewhere classified: Secondary | ICD-10-CM | POA: Diagnosis not present

## 2021-03-28 DIAGNOSIS — Z96612 Presence of left artificial shoulder joint: Secondary | ICD-10-CM | POA: Diagnosis not present

## 2021-03-28 DIAGNOSIS — R531 Weakness: Secondary | ICD-10-CM | POA: Diagnosis not present

## 2021-03-29 ENCOUNTER — Encounter: Payer: Self-pay | Admitting: Neurology

## 2021-04-04 ENCOUNTER — Other Ambulatory Visit: Payer: Self-pay | Admitting: Physician Assistant

## 2021-04-09 ENCOUNTER — Other Ambulatory Visit: Payer: Self-pay | Admitting: Physician Assistant

## 2021-04-10 ENCOUNTER — Ambulatory Visit (HOSPITAL_COMMUNITY)
Admission: RE | Admit: 2021-04-10 | Discharge: 2021-04-10 | Disposition: A | Discharge status: Home / Self Care | Payer: Medicare PPO | Source: Ambulatory Visit | Attending: Neurology | Admitting: Neurology

## 2021-04-10 ENCOUNTER — Other Ambulatory Visit: Payer: Self-pay

## 2021-04-10 DIAGNOSIS — R270 Ataxia, unspecified: Secondary | ICD-10-CM | POA: Insufficient documentation

## 2021-04-10 DIAGNOSIS — G912 (Idiopathic) normal pressure hydrocephalus: Secondary | ICD-10-CM | POA: Diagnosis not present

## 2021-04-10 DIAGNOSIS — Z9181 History of falling: Secondary | ICD-10-CM | POA: Insufficient documentation

## 2021-04-10 DIAGNOSIS — Z0389 Encounter for observation for other suspected diseases and conditions ruled out: Secondary | ICD-10-CM | POA: Diagnosis not present

## 2021-04-10 LAB — CSF CELL COUNT WITH DIFFERENTIAL
RBC Count, CSF: 1 /mm3 — ABNORMAL HIGH
Tube #: 3
WBC, CSF: 1 /mm3 (ref 0–5)

## 2021-04-10 LAB — GLUCOSE, CSF: Glucose, CSF: 67 mg/dL (ref 40–70)

## 2021-04-10 LAB — PROTEIN, CSF: Total  Protein, CSF: 45 mg/dL (ref 15–45)

## 2021-04-10 MED ORDER — LIDOCAINE HCL (PF) 1 % IJ SOLN
5.0000 mL | Freq: Once | INTRAMUSCULAR | Status: AC
  Administered 2021-04-10: 1 mL via INTRADERMAL

## 2021-04-12 ENCOUNTER — Telehealth: Payer: Self-pay | Admitting: Neurology

## 2021-04-12 DIAGNOSIS — R2681 Unsteadiness on feet: Secondary | ICD-10-CM

## 2021-04-12 DIAGNOSIS — T451X5A Adverse effect of antineoplastic and immunosuppressive drugs, initial encounter: Secondary | ICD-10-CM

## 2021-04-12 DIAGNOSIS — Z9181 History of falling: Secondary | ICD-10-CM

## 2021-04-12 DIAGNOSIS — G620 Drug-induced polyneuropathy: Secondary | ICD-10-CM

## 2021-04-12 NOTE — Progress Notes (Signed)
Normal protein and glucose and no cells in CSF, opening pressure was elevated. Awaiting PT report of pre and post LP mobility and cognition.

## 2021-04-12 NOTE — Telephone Encounter (Signed)
NPH work up_ Mr. Jared [name] did not display significant improvements in gait, cognition or balance pre and post LP. Rehab recommends PT for this patient to work on gait speed and point of gravity. I will place order for PT. Larey Seat, MD

## 2021-04-12 NOTE — Telephone Encounter (Addendum)
Can you make sure his PT referral goes to [facility]? Thank you Called pt and relayed results. He is agreeable to PT. He would like to be referred to [facility] in [place], [place].    Scheduled follow up for 07/19/21 at 10:30a w/ Dr. Brett Fairy. Advised him to call if he needs anything prior to this appt.

## 2021-04-13 LAB — CSF CULTURE W GRAM STAIN: Culture: NO GROWTH

## 2021-04-16 LAB — OLIGOCLONAL BANDS, CSF + SERM

## 2021-04-16 NOTE — Progress Notes (Signed)
Poly neuropathy, opening pressure was 20 cm water, and 17 ml CSF were removed- closing pressure 15 cm water.  No oligoclonals AB found in CSF, normal protein, normal glucose, no cells ( 1 WBC and 1 RBC).  All normal findings.

## 2021-04-16 NOTE — Telephone Encounter (Signed)
Noted, sent the PT referral to [facility] ph # [phone] fax # [phone].

## 2021-04-18 DIAGNOSIS — B078 Other viral warts: Secondary | ICD-10-CM | POA: Diagnosis not present

## 2021-04-18 DIAGNOSIS — Z85828 Personal history of other malignant neoplasm of skin: Secondary | ICD-10-CM | POA: Diagnosis not present

## 2021-05-09 DIAGNOSIS — R531 Weakness: Secondary | ICD-10-CM | POA: Diagnosis not present

## 2021-05-09 DIAGNOSIS — R296 Repeated falls: Secondary | ICD-10-CM | POA: Diagnosis not present

## 2021-05-16 DIAGNOSIS — R296 Repeated falls: Secondary | ICD-10-CM | POA: Diagnosis not present

## 2021-05-16 DIAGNOSIS — R531 Weakness: Secondary | ICD-10-CM | POA: Diagnosis not present

## 2021-05-18 DIAGNOSIS — R531 Weakness: Secondary | ICD-10-CM | POA: Diagnosis not present

## 2021-05-18 DIAGNOSIS — R296 Repeated falls: Secondary | ICD-10-CM | POA: Diagnosis not present

## 2021-05-21 DIAGNOSIS — R531 Weakness: Secondary | ICD-10-CM | POA: Diagnosis not present

## 2021-05-21 DIAGNOSIS — R296 Repeated falls: Secondary | ICD-10-CM | POA: Diagnosis not present

## 2021-05-25 DIAGNOSIS — R296 Repeated falls: Secondary | ICD-10-CM | POA: Diagnosis not present

## 2021-05-25 DIAGNOSIS — R531 Weakness: Secondary | ICD-10-CM | POA: Diagnosis not present

## 2021-05-28 DIAGNOSIS — R296 Repeated falls: Secondary | ICD-10-CM | POA: Diagnosis not present

## 2021-05-28 DIAGNOSIS — R531 Weakness: Secondary | ICD-10-CM | POA: Diagnosis not present

## 2021-06-01 DIAGNOSIS — R531 Weakness: Secondary | ICD-10-CM | POA: Diagnosis not present

## 2021-06-01 DIAGNOSIS — R296 Repeated falls: Secondary | ICD-10-CM | POA: Diagnosis not present

## 2021-06-04 DIAGNOSIS — R296 Repeated falls: Secondary | ICD-10-CM | POA: Diagnosis not present

## 2021-06-04 DIAGNOSIS — R531 Weakness: Secondary | ICD-10-CM | POA: Diagnosis not present

## 2021-06-11 DIAGNOSIS — R531 Weakness: Secondary | ICD-10-CM | POA: Diagnosis not present

## 2021-06-11 DIAGNOSIS — R296 Repeated falls: Secondary | ICD-10-CM | POA: Diagnosis not present

## 2021-06-15 DIAGNOSIS — R531 Weakness: Secondary | ICD-10-CM | POA: Diagnosis not present

## 2021-06-15 DIAGNOSIS — R296 Repeated falls: Secondary | ICD-10-CM | POA: Diagnosis not present

## 2021-06-18 DIAGNOSIS — R296 Repeated falls: Secondary | ICD-10-CM | POA: Diagnosis not present

## 2021-06-18 DIAGNOSIS — R531 Weakness: Secondary | ICD-10-CM | POA: Diagnosis not present

## 2021-06-22 DIAGNOSIS — R531 Weakness: Secondary | ICD-10-CM | POA: Diagnosis not present

## 2021-06-22 DIAGNOSIS — R296 Repeated falls: Secondary | ICD-10-CM | POA: Diagnosis not present

## 2021-06-29 DIAGNOSIS — R531 Weakness: Secondary | ICD-10-CM | POA: Diagnosis not present

## 2021-06-29 DIAGNOSIS — R296 Repeated falls: Secondary | ICD-10-CM | POA: Diagnosis not present

## 2021-07-02 DIAGNOSIS — M9905 Segmental and somatic dysfunction of pelvic region: Secondary | ICD-10-CM | POA: Diagnosis not present

## 2021-07-02 DIAGNOSIS — M5384 Other specified dorsopathies, thoracic region: Secondary | ICD-10-CM | POA: Diagnosis not present

## 2021-07-02 DIAGNOSIS — M9902 Segmental and somatic dysfunction of thoracic region: Secondary | ICD-10-CM | POA: Diagnosis not present

## 2021-07-02 DIAGNOSIS — R531 Weakness: Secondary | ICD-10-CM | POA: Diagnosis not present

## 2021-07-02 DIAGNOSIS — M25551 Pain in right hip: Secondary | ICD-10-CM | POA: Diagnosis not present

## 2021-07-02 DIAGNOSIS — R296 Repeated falls: Secondary | ICD-10-CM | POA: Diagnosis not present

## 2021-07-04 DIAGNOSIS — M5384 Other specified dorsopathies, thoracic region: Secondary | ICD-10-CM | POA: Diagnosis not present

## 2021-07-04 DIAGNOSIS — M9905 Segmental and somatic dysfunction of pelvic region: Secondary | ICD-10-CM | POA: Diagnosis not present

## 2021-07-04 DIAGNOSIS — M9902 Segmental and somatic dysfunction of thoracic region: Secondary | ICD-10-CM | POA: Diagnosis not present

## 2021-07-04 DIAGNOSIS — M25551 Pain in right hip: Secondary | ICD-10-CM | POA: Diagnosis not present

## 2021-07-06 DIAGNOSIS — R296 Repeated falls: Secondary | ICD-10-CM | POA: Diagnosis not present

## 2021-07-06 DIAGNOSIS — R531 Weakness: Secondary | ICD-10-CM | POA: Diagnosis not present

## 2021-07-09 DIAGNOSIS — M5384 Other specified dorsopathies, thoracic region: Secondary | ICD-10-CM | POA: Diagnosis not present

## 2021-07-09 DIAGNOSIS — M9905 Segmental and somatic dysfunction of pelvic region: Secondary | ICD-10-CM | POA: Diagnosis not present

## 2021-07-09 DIAGNOSIS — M9902 Segmental and somatic dysfunction of thoracic region: Secondary | ICD-10-CM | POA: Diagnosis not present

## 2021-07-09 DIAGNOSIS — M25551 Pain in right hip: Secondary | ICD-10-CM | POA: Diagnosis not present

## 2021-07-11 DIAGNOSIS — R531 Weakness: Secondary | ICD-10-CM | POA: Diagnosis not present

## 2021-07-11 DIAGNOSIS — R296 Repeated falls: Secondary | ICD-10-CM | POA: Diagnosis not present

## 2021-07-16 ENCOUNTER — Other Ambulatory Visit: Payer: Self-pay

## 2021-07-16 ENCOUNTER — Ambulatory Visit (INDEPENDENT_AMBULATORY_CARE_PROVIDER_SITE_OTHER): Payer: Medicare PPO | Admitting: Neurology

## 2021-07-16 ENCOUNTER — Encounter: Payer: Self-pay | Admitting: Neurology

## 2021-07-16 VITALS — BP 118/77 | HR 76 | Ht 70.0 in | Wt 167.0 lb

## 2021-07-16 DIAGNOSIS — T451X5A Adverse effect of antineoplastic and immunosuppressive drugs, initial encounter: Secondary | ICD-10-CM | POA: Diagnosis not present

## 2021-07-16 DIAGNOSIS — Z85038 Personal history of other malignant neoplasm of large intestine: Secondary | ICD-10-CM

## 2021-07-16 DIAGNOSIS — M5384 Other specified dorsopathies, thoracic region: Secondary | ICD-10-CM | POA: Diagnosis not present

## 2021-07-16 DIAGNOSIS — G620 Drug-induced polyneuropathy: Secondary | ICD-10-CM

## 2021-07-16 DIAGNOSIS — Z9181 History of falling: Secondary | ICD-10-CM

## 2021-07-16 DIAGNOSIS — R9089 Other abnormal findings on diagnostic imaging of central nervous system: Secondary | ICD-10-CM

## 2021-07-16 DIAGNOSIS — G622 Polyneuropathy due to other toxic agents: Secondary | ICD-10-CM

## 2021-07-16 DIAGNOSIS — M25551 Pain in right hip: Secondary | ICD-10-CM | POA: Diagnosis not present

## 2021-07-16 DIAGNOSIS — M9902 Segmental and somatic dysfunction of thoracic region: Secondary | ICD-10-CM | POA: Diagnosis not present

## 2021-07-16 DIAGNOSIS — M9905 Segmental and somatic dysfunction of pelvic region: Secondary | ICD-10-CM | POA: Diagnosis not present

## 2021-07-16 NOTE — Progress Notes (Signed)
Provider:  Larey [name], M D  Referring Provider: Donnajean Lopes, MD Primary Care Physician:  Jared Lopes, MD  Chief Complaint  Patient presents with   Follow-up    Pt with wife, rm 52. Presents today for f/u post MRI  for possible NPH and LP. No changes after LP were noted.  Has been working with therapy. Overally feels balance intermittently comes and goes.     HPI:  Jared [name]. is a 81 y.o. male of [place] descent seen here on 07-16-2021: upon a referral from Dr. Philip [name] for yet another evaluation of gait disorder ,post chemotherapy neuropathy, ascending, non -painful.   After our last visit in October the patient was evaluated by an MRI since Dr. Garth [name] study had indicated the possibility of normal pressure hydrocephalus this study was compared to 03-17-2020 and stated there was a subtle progression of atrophy and ventriculomegaly but there was also ex-vacuo atrophy.  So we did perform the fluoroscopic lumbar puncture with the help of our radiologist.  In the beginning opening pressure was 20 cmH2O 17 mL were obtained and the closing pressure was then 15 cm water.  The patient tolerated the procedure well but he reported no improvement in his mobility cognitive ability or bladder control.  Samples were sent to the lab.Poly neuropathy, opening pressure was 20 cm water, and 17 ml CSF were removed- closing pressure 15 cm water.  No oligoclonals AB found in CSF, normal protein, normal glucose, no cells ( 1 WBC and 1 RBC).  All normal findings.    He was found to be anemic in 11-29-2020- after shoulder surgery. Hba1c  was 6.1.  No shunt will follow .  Here with walker. He changed form a cane in 2021.  Reviewed atrophy on MRI- fairly prominent.  He reports no swallowing problems and had dysphonia.   RV 02-05-2021: This time he enters the room with a 4 pronged cane. He fell July 4th after attending a conference.  Days of poor balance and days of lesser strength. Had  fractured the left shoulder and needed a replacement. Titanium now.   Has severely declined in motor skills.      02-08-2020  Jared [name] reports onset of neuropathy following his treatments for colon cancer, diagnosed 15 years ago. Oxalo-platinum ws a key ingredient in the chemotherapy that followed. This concluded in the year 2006, after treatment for  several month, he immediately noted numbness in his toes and soon after fingers.  This numbness has ascendent and now he is not able to tell where in space his feet or lower leg are- he has impaired propioception. May be coincidentially, he lost hearing - now needed aids.   He is now furniture and wall surfing, has had many near falls. He loses balance while standing at the kitchen counter, bending over the kitchen sink/ baisin, he falls forward but he has also felt pulled backwards, too.   He already has been in PT for balance and fall prevention, he exercises frequently at home.  It has not helped, his strength is fine, his coordination and propioception. He has to look at his feet when walking- can't walk on uneven surfaces.   He takes B vitamins and vitamin D. Has taken many,many supplements, and has not taken the COVID vaccine (!)>     Review of Systems: Out of a complete 14 system review, the patient complains of only the following symptoms, and all other reviewed systems are  negative.   hearing loss, Ataxia, numbness, falls.    Social History   Socioeconomic History   Marital status: Married    Spouse name: Not on file   Number of children: Not on file   Years of education: Not on file   Highest education level: Not on file  Occupational History   Not on file  Tobacco Use   Smoking status: Never   Smokeless tobacco: Never  Vaping Use   Vaping Use: Never used  Substance and Sexual Activity   Alcohol use: Yes    Alcohol/week: 1.0 standard drink    Types: 1 Glasses of wine per week   Drug use: No   Sexual activity:  Not on file  Other Topics Concern   Not on file  Social History Narrative   Not on file   Social Determinants of Health   Financial Resource Strain: Not on file  Food Insecurity: Not on file  Transportation Needs: Not on file  Physical Activity: Not on file  Stress: Not on file  Social Connections: Not on file  Intimate Partner Violence: Not on file    Family History  Problem Relation Age of Onset   Other Mother        old age   Hodgkin's lymphoma Mother    Heart failure Father     Past Medical History:  Diagnosis Date   colon cancer    History of kidney stones    Hypertension    Patient denies   Neuropathy due to chemotherapeutic drug [facility])    Neurologist says doesnt have it   Pre-diabetes     Past Surgical History:  Procedure Laterality Date   CATARACT EXTRACTION, BILATERAL     COLON SURGERY  2004   R hemicolectomy - removed due to cancer   COLONOSCOPY     [place] Left 11/29/2020   Procedure: REVERSE SHOULDER ARTHROPLASTY;  Surgeon: Hiram Gash, MD;  Location: [facility];  Service: Orthopedics;  Laterality: Left;    Current Outpatient Medications  Medication Sig Dispense Refill   Alpha-Lipoic Acid 600 MG CAPS Take 600 mg by mouth daily.     AMINO ACIDS COMPLEX PO Take 2 Scoops by mouth daily.     Ascorbic Acid (VITAMIN C) 1000 MG tablet Take 1,000 mg by mouth daily.     b complex vitamins capsule Take 1 capsule by mouth daily.     Barberry-Oreg Grape-Goldenseal (BERBERINE COMPLEX PO) Take 3 capsules by mouth daily.     calcium carbonate (OS-CAL) 600 MG TABS tablet Take 600 mg by mouth daily with breakfast.     Capsicum, Cayenne, (CAYENNE PO) Take 3.4 g by mouth daily.     Cholecalciferol (VITAMIN D3) 125 MCG (5000 UT) CAPS Take 5,000 Units by mouth daily.     Chromium 1 MG CAPS Take 1 capsule (1 mg total) by mouth daily.     Cinnamon 500 MG capsule Take 500 mg by mouth daily.     CITRULLINE PO  Take 1 Scoop by mouth daily.     Coenzyme Q10 (COQ10) 100 MG CAPS Take 100 mg by mouth daily.     COLLAGEN PO Take 1 Scoop by mouth daily.     CRANBERRY EXTRACT PO Take 1 capsule by mouth daily.     Fe Bisgly-Vit C-Vit B12-FA (GENTLE IRON PO) Take 1 capsule by mouth 2 (two) times daily with a meal.  Garlic (GARLIQUE PO) Take 2 capsules by mouth daily.     Ginger, Zingiber officinalis, (GINGER PO) Take 5 g by mouth daily.     GINSENG PO Take 100 mg by mouth daily.     Iodine, Kelp, (KELP PO) Take 225 mcg by mouth daily.     NON FORMULARY Take 1 capsule by mouth See admin instructions. Gymnema sylvestre capsule- Take 1 capsule by mouth once a day as needed to block sugar     OVER THE COUNTER MEDICATION Take 3 capsules by mouth daily. Juniper Berry     OVER THE COUNTER MEDICATION Take 5 g by mouth See admin instructions. Beet Powder- Mix 1 heaping teaspoonful into desired beverage and drink once a day     OVER THE COUNTER MEDICATION Take 1 Scoop by mouth daily. Powdered Greens     OVER THE COUNTER MEDICATION Take 4 capsules by mouth See admin instructions. Beta Sistosterol- Take 4 capsules by mouth once a day     PAPAYA ENZYME PO Take 1 capsule by mouth daily as needed (TO AID DIGESTION).     Potassium 99 MG TABS Take 99 mg by mouth daily.     RESVERATROL PO Take 1,000 mg by mouth daily.     Saw Palmetto, Serenoa repens, (SAW PALMETTO PO) Take 540 mg by mouth daily.     Turmeric 500 MG CAPS Take 500 mg by mouth daily.     vitamin E 180 MG (400 UNITS) capsule Take 400 Units by mouth daily.     zinc gluconate 50 MG tablet Take 50 mg by mouth daily.     No current facility-administered medications for this visit.   Assessment:  After physical and neurologic examination, review of laboratory studies, imaging, IMPRESSION: This MRI of the brain without contrast shows the following: 1.   Moderate generalized cortical atrophy.   Minimal to mild cerebellar vermis atrophy is also noted. 2.   Though  the temporal horns of the lateral ventricles are not significantly enlarged, there are other findings often seen with normal pressure hydrocephalus including bowing of the corpus callosum and tight sulci at the vertex.  If clinically indicated, consider this diagnosis. 3.   Multiple T2/FLAIR hyperintense foci in the hemispheres and pons.  There are large confluencies noted in the hemispheres. 4.   No acute findings.    INTERPRETING PHYSICIAN:  Richard A. Felecia Shelling, MD, PhD, FAAN  Neurophysiology testing and pre-existing records, assessment is that of : Sensory loss to vibration/ temperature and impaired propioception.  Very small steps and unsteady gait, magnetic feet. High fall risk. This patient is a former runner !   Plan:  Treatment plan and additional workup :  Allergies as of 07/16/2021   (No Known Allergies)    Vitals: BP 118/77    Pulse 76    Ht '5\' 10"'$  (1.778 m)    Wt 167 lb (75.8 kg)    BMI 23.96 kg/m  Last Weight:  Wt Readings from Last 1 Encounters:  07/16/21 167 lb (75.8 kg)   Last Height:   Ht Readings from Last 1 Encounters:  07/16/21 '5\' 10"'$  (1.778 m)    Physical exam:  General: The patient is awake, alert and appears not in acute distress. The patient is well groomed. Head: Normocephalic, atraumatic. Neck is supple. Mallampati 2, neck circumference:16.5 Cardiovascular:  Regular rate and rhythm, without  murmurs or carotid bruit, and without distended neck veins. Respiratory: Lungs are clear to auscultation. Skin:  Without evidence of edema,  or rash Trunk: BMI is 25   Neurologic exam : The patient is awake and alert, oriented to place and time.   Memory subjective  described as intact.  MMSE - Mini Mental State Exam 07/16/2021  Orientation to time 4  Orientation to Place 4  Registration 3  Attention/ Calculation 5  Recall 3  Language- name 2 objects 2  Language- repeat 1  Language- follow 3 step command 3  Language- read & follow direction 1  Write a  sentence 1  Copy design 1  Total score 28    Needs MOCA next time.     There is a normal attention span & concentration ability. Speech is fluent without  dysarthria, dysphonia or aphasia. Mood and affect are appropriate.  Cranial nerves: Pupils are equal and briskly reactive to light. Funduscopic exam without evidence of pallor or edema.  Extraocular movements in vertical and horizontal planes intact and without nystagmus.  Visual fields by finger perimetry are intact. Hearing to finger rub intact.  Facial sensation intact to fine touch. Facial motor strength is symmetric and tongue and uvula move midline. Tongue protrusion into either cheek is normal. Shoulder shrug is normal.   Motor exam:   Normal tone ,muscle bulk and symmetric strength in all extremities.  Sensory:  Fine touch, pinprick and vibration were tested in all extremities.  The symptoms of vibration was severely impaired below both knees.  There was no detection of vibration on the chin or at the toes or ankles.  I repeated this today- 07-16-2021. He has had chemotherapy and he now has DM.   There is also no pain it is profound numbness to temperature and vibration. Proprioception was impaired as noted in coordination testing, there was a right pronator drift that the patient was not aware of.  Coordination: Rapid alternating movements in the fingers/hands were normal. Finger-to-nose maneuver impaired with evidence of of ataxia, dysmetria -but no tremor.  Gait and station: Patient walks now with assistive device and was able unassisted to rise, he walked without his walker  but took his cane - and took wide based  steps, . He appears stiff, can't turn- turning required only 5.5 steps,last time 8 steps.    I do not clearly see a propulsive or retropulsive tendency but rather a proprioception impairment in all directions.    Deep tendon reflexes: in the upper extremities are absent, while I could elicit them last visit-    Lower extremity reflexes are trace at best. Babinski maneuver deferred.   Assessment :  wide based gait disorder multifactorial ;  high fall risk. Continue PT and exercises. No Lp needed- his opening pressure of 20 cm was elevated but a reduction to 15 cm water did not change his gait or cognition.  He appears less ataxic. He still is profoundly numb I both feet.  There was no detection of vibration on the chin or at the toes or ankles.  I repeated this today- 07-16-2021. He has had chemotherapy and he now has DM.   Plan :  1)continue with PT , and hydrate well. Water for hydration.              2) methylated vitamin b 12 if B 12 is low.              3) no NPH found.             4)has seen chiropractor for back pain/ shoulder pain. He may have a thoracic  or lumbar DDD, but    had X rays with chiropractor- never told he has had abnormal findings.     I will ask this pleasant patient to follow up with PCP.    Asencion Partridge Taylon Coole MD 07/16/2021

## 2021-07-16 NOTE — Patient Instructions (Signed)
Peripheral Neuropathy ?Peripheral neuropathy is a type of nerve damage. It affects nerves that carry signals between the spinal cord and the arms, legs, and the rest of the body (peripheral nerves). It does not affect nerves in the spinal cord or brain. In peripheral neuropathy, one nerve or a group of nerves may be damaged. Peripheral neuropathy is a broad category that includes many specific nerve disorders, like diabetic neuropathy, hereditary neuropathy, and carpal tunnel syndrome. ?What are the causes? ?This condition may be caused by: ?Diabetes. This is the most common cause of peripheral neuropathy. ?Nerve injury. ?Pressure or stress on a nerve that lasts a long time. ?Lack (deficiency) of B vitamins. This can result from alcoholism, poor diet, or a restricted diet. ?Infections. ?Autoimmune diseases, such as rheumatoid arthritis and systemic lupus erythematosus. ?Nerve diseases that are passed from parent to child (inherited). ?Some medicines, such as cancer medicines (chemotherapy). ?Poisonous (toxic) substances, such as lead and mercury. ?Too little blood flowing to the legs. ?Kidney disease. ?Thyroid disease. ?In some cases, the cause of this condition is not known. ?What are the signs or symptoms? ?Symptoms of this condition depend on which of your nerves is damaged. Common symptoms include: ?Loss of feeling (numbness) in the feet, hands, or both. ?Tingling in the feet, hands, or both. ?Burning pain. ?Very sensitive skin. ?Weakness. ?Not being able to move a part of the body (paralysis). ?Muscle twitching. ?Clumsiness or poor coordination. ?Loss of balance. ?Not being able to control your bladder. ?Feeling dizzy. ?Sexual problems. ?How is this diagnosed? ?Diagnosing and finding the cause of peripheral neuropathy can be difficult. Your health care provider will take your medical history and do a physical exam. A neurological exam will also be done. This involves checking things that are affected by your  brain, spinal cord, and nerves (nervous system). For example, your health care provider will check your reflexes, how you move, and what you can feel. ?You may have other tests, such as: ?Blood tests. ?Electromyogram (EMG) and nerve conduction tests. These tests check nerve function and how well the nerves are controlling the muscles. ?Imaging tests, such as CT scans or MRI to rule out other causes of your symptoms. ?Removing a small piece of nerve to be examined in a lab (nerve biopsy). ?Removing and examining a small amount of the fluid that surrounds the brain and spinal cord (lumbar puncture). ?How is this treated? ?Treatment for this condition may involve: ?Treating the underlying cause of the neuropathy, such as diabetes, kidney disease, or vitamin deficiencies. ?Stopping medicines that can cause neuropathy, such as chemotherapy. ?Medicine to help relieve pain. Medicines may include: ?Prescription or over-the-counter pain medicine. ?Antiseizure medicine. ?Antidepressants. ?Pain-relieving patches that are applied to painful areas of skin. ?Surgery to relieve pressure on a nerve or to destroy a nerve that is causing pain. ?Physical therapy to help improve movement and balance. ?Devices to help you move around (assistive devices). ?Follow these instructions at home: ?Medicines ?Take over-the-counter and prescription medicines only as told by your health care provider. Do not take any other medicines without first asking your health care provider. ?Do not drive or use heavy machinery while taking prescription pain medicine. ?Lifestyle ? ?Do not use any products that contain nicotine or tobacco, such as cigarettes and e-cigarettes. Smoking keeps blood from reaching damaged nerves. If you need help quitting, ask your health care provider. ?Avoid or limit alcohol. Too much alcohol can cause a vitamin B deficiency, and vitamin B is needed for healthy nerves. ?Eat a   healthy diet. This includes: ?Eating foods that are  high in fiber, such as fresh fruits and vegetables, whole grains, and beans. ?Limiting foods that are high in fat and processed sugars, such as fried or sweet foods. ?General instructions ? ?If you have diabetes, work closely with your health care provider to keep your blood sugar under control. ?If you have numbness in your feet: ?Check every day for signs of injury or infection. Watch for redness, warmth, and swelling. ?Wear padded socks and comfortable shoes. These help protect your feet. ?Develop a good support system. Living with peripheral neuropathy can be stressful. Consider talking with a mental health specialist or joining a support group. ?Use assistive devices and attend physical therapy as told by your health care provider. This may include using a walker or a cane. ?Keep all follow-up visits as told by your health care provider. This is important. ?Contact a health care provider if: ?You have new signs or symptoms of peripheral neuropathy. ?You are struggling emotionally from dealing with peripheral neuropathy. ?Your pain is not well-controlled. ?Get help right away if: ?You have an injury or infection that is not healing normally. ?You develop new weakness in an arm or leg. ?You have fallen or do so frequently. ?Summary ?Peripheral neuropathy is when the nerves in the arms, or legs are damaged, resulting in numbness, weakness, or pain. ?There are many causes of peripheral neuropathy, including diabetes, pinched nerves, vitamin deficiencies, autoimmune disease, and hereditary conditions. ?Diagnosing and finding the cause of peripheral neuropathy can be difficult. Your health care provider will take your medical history, do a physical exam, and do tests, including blood tests and nerve function tests. ?Treatment involves treating the underlying cause of the neuropathy and taking medicines to help control pain. Physical therapy and assistive devices may also help. ?This information is not intended to  replace advice given to you by your health care provider. Make sure you discuss any questions you have with your health care provider. ?Document Revised: 02/01/2020 Document Reviewed: 02/01/2020 ?Elsevier Patient Education ? 2022 [employer] ? ?

## 2021-07-17 NOTE — Progress Notes (Signed)
High BUN indicates possible dehydration. ?HbA 1c level was pre diabetic.  ?No longer anemic- compared to 7 months ago.

## 2021-07-18 ENCOUNTER — Encounter: Payer: Self-pay | Admitting: Neurology

## 2021-07-18 DIAGNOSIS — M25551 Pain in right hip: Secondary | ICD-10-CM | POA: Diagnosis not present

## 2021-07-18 DIAGNOSIS — M9902 Segmental and somatic dysfunction of thoracic region: Secondary | ICD-10-CM | POA: Diagnosis not present

## 2021-07-18 DIAGNOSIS — M9905 Segmental and somatic dysfunction of pelvic region: Secondary | ICD-10-CM | POA: Diagnosis not present

## 2021-07-18 DIAGNOSIS — M5384 Other specified dorsopathies, thoracic region: Secondary | ICD-10-CM | POA: Diagnosis not present

## 2021-07-19 ENCOUNTER — Ambulatory Visit: Payer: Medicare PPO | Admitting: Neurology

## 2021-07-23 DIAGNOSIS — M5384 Other specified dorsopathies, thoracic region: Secondary | ICD-10-CM | POA: Diagnosis not present

## 2021-07-23 DIAGNOSIS — M9905 Segmental and somatic dysfunction of pelvic region: Secondary | ICD-10-CM | POA: Diagnosis not present

## 2021-07-23 DIAGNOSIS — M25551 Pain in right hip: Secondary | ICD-10-CM | POA: Diagnosis not present

## 2021-07-23 DIAGNOSIS — M9902 Segmental and somatic dysfunction of thoracic region: Secondary | ICD-10-CM | POA: Diagnosis not present

## 2021-07-23 DIAGNOSIS — R531 Weakness: Secondary | ICD-10-CM | POA: Diagnosis not present

## 2021-07-23 DIAGNOSIS — R296 Repeated falls: Secondary | ICD-10-CM | POA: Diagnosis not present

## 2021-07-23 NOTE — Progress Notes (Signed)
Pre-diabetes and chemotherapy may be the most likely causes of neuropathic changes.

## 2021-07-24 LAB — MULTIPLE MYELOMA PANEL, SERUM
Albumin SerPl Elph-Mcnc: 3.9 g/dL (ref 2.9–4.4)
Albumin/Glob SerPl: 1.3 (ref 0.7–1.7)
Alpha 1: 0.2 g/dL (ref 0.0–0.4)
Alpha2 Glob SerPl Elph-Mcnc: 0.7 g/dL (ref 0.4–1.0)
B-Globulin SerPl Elph-Mcnc: 1 g/dL (ref 0.7–1.3)
Gamma Glob SerPl Elph-Mcnc: 1.2 g/dL (ref 0.4–1.8)
Globulin, Total: 3.1 g/dL (ref 2.2–3.9)
IgA/Immunoglobulin A, Serum: 381 mg/dL (ref 61–437)
IgG (Immunoglobin G), Serum: 1307 mg/dL (ref 603–1613)
IgM (Immunoglobulin M), Srm: 52 mg/dL (ref 15–143)

## 2021-07-24 LAB — CBC WITH DIFFERENTIAL/PLATELET
Basophils Absolute: 0 10*3/uL (ref 0.0–0.2)
Basos: 1 %
EOS (ABSOLUTE): 0.2 10*3/uL (ref 0.0–0.4)
Eos: 4 %
Hematocrit: 40.4 % (ref 37.5–51.0)
Hemoglobin: 13.8 g/dL (ref 13.0–17.7)
Immature Grans (Abs): 0 10*3/uL (ref 0.0–0.1)
Immature Granulocytes: 0 %
Lymphocytes Absolute: 2 10*3/uL (ref 0.7–3.1)
Lymphs: 33 %
MCH: 31 pg (ref 26.6–33.0)
MCHC: 34.2 g/dL (ref 31.5–35.7)
MCV: 91 fL (ref 79–97)
Monocytes Absolute: 0.6 10*3/uL (ref 0.1–0.9)
Monocytes: 11 %
Neutrophils Absolute: 3.2 10*3/uL (ref 1.4–7.0)
Neutrophils: 51 %
Platelets: 285 10*3/uL (ref 150–450)
RBC: 4.45 x10E6/uL (ref 4.14–5.80)
RDW: 11.8 % (ref 11.6–15.4)
WBC: 6.1 10*3/uL (ref 3.4–10.8)

## 2021-07-24 LAB — COMPREHENSIVE METABOLIC PANEL
ALT: 12 IU/L (ref 0–44)
AST: 19 IU/L (ref 0–40)
Albumin/Globulin Ratio: 1.6 (ref 1.2–2.2)
Albumin: 4.3 g/dL (ref 3.7–4.7)
Alkaline Phosphatase: 102 IU/L (ref 44–121)
BUN/Creatinine Ratio: 27 — ABNORMAL HIGH (ref 10–24)
BUN: 20 mg/dL (ref 8–27)
Bilirubin Total: <0.2 mg/dL (ref 0.0–1.2)
CO2: 25 mmol/L (ref 20–29)
Calcium: 9.6 mg/dL (ref 8.6–10.2)
Chloride: 104 mmol/L (ref 96–106)
Creatinine, Ser: 0.75 mg/dL — ABNORMAL LOW (ref 0.76–1.27)
Globulin, Total: 2.7 g/dL (ref 1.5–4.5)
Glucose: 116 mg/dL — ABNORMAL HIGH (ref 70–99)
Potassium: 4.2 mmol/L (ref 3.5–5.2)
Sodium: 141 mmol/L (ref 134–144)
Total Protein: 7 g/dL (ref 6.0–8.5)
eGFR: 92 mL/min/{1.73_m2} (ref >59)

## 2021-07-24 LAB — SJOGREN'S SYNDROME ANTIBODS(SSA + SSB)
ENA SSA (RO) Ab: <0.2 AI (ref 0.0–0.9)
ENA SSB (LA) Ab: <0.2 AI (ref 0.0–0.9)

## 2021-07-24 LAB — VITAMIN B12: Vitamin B-12: 752 pg/mL (ref 232–1245)

## 2021-07-24 LAB — SEDIMENTATION RATE: Sed Rate: 8 mm/hr (ref 0–30)

## 2021-07-24 LAB — HEMOGLOBIN A1C
Est. average glucose Bld gHb Est-mCnc: 123 mg/dL
Hgb A1c MFr Bld: 5.9 % — ABNORMAL HIGH (ref 4.8–5.6)

## 2021-07-24 LAB — MAG IGM ANTIBODIES: MAG IgM Antibodies: <900 BTU (ref 0–999)

## 2021-07-24 LAB — ANA W/REFLEX: ANA Titer 1: NEGATIVE

## 2021-07-24 LAB — GM1 IGG AUTOANTIBODIES: GM1 IgG Autoantibodies: <20 % (ref 0–30)

## 2021-07-24 LAB — MAG INTERPRETATION REFLEXED

## 2021-07-24 LAB — TSH: TSH: 1.3 u[IU]/mL (ref 0.450–4.500)

## 2021-07-24 LAB — INTERPRETATION

## 2021-07-24 LAB — HOMOCYSTEINE: Homocysteine: 8.6 umol/L (ref 0.0–19.2)

## 2021-07-24 LAB — C-REACTIVE PROTEIN: CRP: <1 mg/L (ref 0–10)

## 2021-07-30 DIAGNOSIS — R531 Weakness: Secondary | ICD-10-CM | POA: Diagnosis not present

## 2021-07-30 DIAGNOSIS — M9905 Segmental and somatic dysfunction of pelvic region: Secondary | ICD-10-CM | POA: Diagnosis not present

## 2021-07-30 DIAGNOSIS — M5384 Other specified dorsopathies, thoracic region: Secondary | ICD-10-CM | POA: Diagnosis not present

## 2021-07-30 DIAGNOSIS — R296 Repeated falls: Secondary | ICD-10-CM | POA: Diagnosis not present

## 2021-07-30 DIAGNOSIS — M25551 Pain in right hip: Secondary | ICD-10-CM | POA: Diagnosis not present

## 2021-07-30 DIAGNOSIS — M9902 Segmental and somatic dysfunction of thoracic region: Secondary | ICD-10-CM | POA: Diagnosis not present

## 2021-08-06 DIAGNOSIS — M25551 Pain in right hip: Secondary | ICD-10-CM | POA: Diagnosis not present

## 2021-08-06 DIAGNOSIS — M9905 Segmental and somatic dysfunction of pelvic region: Secondary | ICD-10-CM | POA: Diagnosis not present

## 2021-08-06 DIAGNOSIS — M9902 Segmental and somatic dysfunction of thoracic region: Secondary | ICD-10-CM | POA: Diagnosis not present

## 2021-08-06 DIAGNOSIS — M5384 Other specified dorsopathies, thoracic region: Secondary | ICD-10-CM | POA: Diagnosis not present

## 2021-08-09 DIAGNOSIS — S42202D Unspecified fracture of upper end of left humerus, subsequent encounter for fracture with routine healing: Secondary | ICD-10-CM | POA: Diagnosis not present

## 2021-08-13 DIAGNOSIS — M5384 Other specified dorsopathies, thoracic region: Secondary | ICD-10-CM | POA: Diagnosis not present

## 2021-08-13 DIAGNOSIS — M9905 Segmental and somatic dysfunction of pelvic region: Secondary | ICD-10-CM | POA: Diagnosis not present

## 2021-08-13 DIAGNOSIS — M9902 Segmental and somatic dysfunction of thoracic region: Secondary | ICD-10-CM | POA: Diagnosis not present

## 2021-08-13 DIAGNOSIS — M25551 Pain in right hip: Secondary | ICD-10-CM | POA: Diagnosis not present

## 2021-08-20 DIAGNOSIS — E119 Type 2 diabetes mellitus without complications: Secondary | ICD-10-CM | POA: Diagnosis not present

## 2021-08-27 DIAGNOSIS — M5384 Other specified dorsopathies, thoracic region: Secondary | ICD-10-CM | POA: Diagnosis not present

## 2021-08-27 DIAGNOSIS — M9905 Segmental and somatic dysfunction of pelvic region: Secondary | ICD-10-CM | POA: Diagnosis not present

## 2021-08-27 DIAGNOSIS — M9902 Segmental and somatic dysfunction of thoracic region: Secondary | ICD-10-CM | POA: Diagnosis not present

## 2021-08-27 DIAGNOSIS — M25551 Pain in right hip: Secondary | ICD-10-CM | POA: Diagnosis not present

## 2021-08-29 DIAGNOSIS — L570 Actinic keratosis: Secondary | ICD-10-CM | POA: Diagnosis not present

## 2021-08-29 DIAGNOSIS — Z85828 Personal history of other malignant neoplasm of skin: Secondary | ICD-10-CM | POA: Diagnosis not present

## 2021-08-29 DIAGNOSIS — L821 Other seborrheic keratosis: Secondary | ICD-10-CM | POA: Diagnosis not present

## 2021-08-29 DIAGNOSIS — L814 Other melanin hyperpigmentation: Secondary | ICD-10-CM | POA: Diagnosis not present

## 2021-08-29 DIAGNOSIS — C44629 Squamous cell carcinoma of skin of left upper limb, including shoulder: Secondary | ICD-10-CM | POA: Diagnosis not present

## 2021-08-29 DIAGNOSIS — L905 Scar conditions and fibrosis of skin: Secondary | ICD-10-CM | POA: Diagnosis not present

## 2021-09-20 DIAGNOSIS — S42202D Unspecified fracture of upper end of left humerus, subsequent encounter for fracture with routine healing: Secondary | ICD-10-CM | POA: Diagnosis not present

## 2021-09-24 DIAGNOSIS — M5384 Other specified dorsopathies, thoracic region: Secondary | ICD-10-CM | POA: Diagnosis not present

## 2021-09-24 DIAGNOSIS — M9902 Segmental and somatic dysfunction of thoracic region: Secondary | ICD-10-CM | POA: Diagnosis not present

## 2021-09-24 DIAGNOSIS — M9905 Segmental and somatic dysfunction of pelvic region: Secondary | ICD-10-CM | POA: Diagnosis not present

## 2021-09-24 DIAGNOSIS — M25551 Pain in right hip: Secondary | ICD-10-CM | POA: Diagnosis not present

## 2021-10-03 DIAGNOSIS — M19049 Primary osteoarthritis, unspecified hand: Secondary | ICD-10-CM | POA: Diagnosis not present

## 2021-10-03 DIAGNOSIS — I10 Essential (primary) hypertension: Secondary | ICD-10-CM | POA: Diagnosis not present

## 2021-10-22 DIAGNOSIS — M5384 Other specified dorsopathies, thoracic region: Secondary | ICD-10-CM | POA: Diagnosis not present

## 2021-10-22 DIAGNOSIS — M9905 Segmental and somatic dysfunction of pelvic region: Secondary | ICD-10-CM | POA: Diagnosis not present

## 2021-10-22 DIAGNOSIS — M25551 Pain in right hip: Secondary | ICD-10-CM | POA: Diagnosis not present

## 2021-10-22 DIAGNOSIS — M9902 Segmental and somatic dysfunction of thoracic region: Secondary | ICD-10-CM | POA: Diagnosis not present

## 2021-12-06 DIAGNOSIS — R739 Hyperglycemia, unspecified: Secondary | ICD-10-CM | POA: Diagnosis not present

## 2021-12-06 DIAGNOSIS — R7989 Other specified abnormal findings of blood chemistry: Secondary | ICD-10-CM | POA: Diagnosis not present

## 2021-12-06 DIAGNOSIS — E785 Hyperlipidemia, unspecified: Secondary | ICD-10-CM | POA: Diagnosis not present

## 2021-12-06 DIAGNOSIS — Z0000 Encounter for general adult medical examination without abnormal findings: Secondary | ICD-10-CM | POA: Diagnosis not present

## 2021-12-06 DIAGNOSIS — I10 Essential (primary) hypertension: Secondary | ICD-10-CM | POA: Diagnosis not present

## 2021-12-10 DIAGNOSIS — I10 Essential (primary) hypertension: Secondary | ICD-10-CM | POA: Diagnosis not present

## 2021-12-10 DIAGNOSIS — R82998 Other abnormal findings in urine: Secondary | ICD-10-CM | POA: Diagnosis not present

## 2021-12-11 DIAGNOSIS — I10 Essential (primary) hypertension: Secondary | ICD-10-CM | POA: Diagnosis not present

## 2021-12-11 DIAGNOSIS — R2681 Unsteadiness on feet: Secondary | ICD-10-CM | POA: Diagnosis not present

## 2021-12-11 DIAGNOSIS — R739 Hyperglycemia, unspecified: Secondary | ICD-10-CM | POA: Diagnosis not present

## 2021-12-11 DIAGNOSIS — Z85038 Personal history of other malignant neoplasm of large intestine: Secondary | ICD-10-CM | POA: Diagnosis not present

## 2021-12-11 DIAGNOSIS — G609 Hereditary and idiopathic neuropathy, unspecified: Secondary | ICD-10-CM | POA: Diagnosis not present

## 2021-12-11 DIAGNOSIS — Z0000 Encounter for general adult medical examination without abnormal findings: Secondary | ICD-10-CM | POA: Diagnosis not present

## 2021-12-11 DIAGNOSIS — E785 Hyperlipidemia, unspecified: Secondary | ICD-10-CM | POA: Diagnosis not present

## 2022-04-01 DIAGNOSIS — R0781 Pleurodynia: Secondary | ICD-10-CM | POA: Diagnosis not present

## 2022-04-01 DIAGNOSIS — G629 Polyneuropathy, unspecified: Secondary | ICD-10-CM | POA: Diagnosis not present

## 2022-04-01 DIAGNOSIS — W182XXA Fall in (into) shower or empty bathtub, initial encounter: Secondary | ICD-10-CM | POA: Diagnosis not present

## 2022-04-01 DIAGNOSIS — R2681 Unsteadiness on feet: Secondary | ICD-10-CM | POA: Diagnosis not present

## 2022-05-08 DIAGNOSIS — R531 Weakness: Secondary | ICD-10-CM | POA: Diagnosis not present

## 2022-05-08 DIAGNOSIS — Z1159 Encounter for screening for other viral diseases: Secondary | ICD-10-CM | POA: Diagnosis not present

## 2022-05-08 DIAGNOSIS — R4189 Other symptoms and signs involving cognitive functions and awareness: Secondary | ICD-10-CM | POA: Diagnosis not present

## 2022-05-08 DIAGNOSIS — R2681 Unsteadiness on feet: Secondary | ICD-10-CM | POA: Diagnosis not present

## 2022-05-08 DIAGNOSIS — Z9221 Personal history of antineoplastic chemotherapy: Secondary | ICD-10-CM | POA: Diagnosis not present

## 2022-05-08 DIAGNOSIS — R413 Other amnesia: Secondary | ICD-10-CM | POA: Diagnosis not present

## 2022-05-08 DIAGNOSIS — R269 Unspecified abnormalities of gait and mobility: Secondary | ICD-10-CM | POA: Diagnosis not present

## 2022-05-17 DIAGNOSIS — M5136 Other intervertebral disc degeneration, lumbar region: Secondary | ICD-10-CM | POA: Diagnosis not present

## 2022-05-17 DIAGNOSIS — M47816 Spondylosis without myelopathy or radiculopathy, lumbar region: Secondary | ICD-10-CM | POA: Diagnosis not present

## 2022-05-17 DIAGNOSIS — M48061 Spinal stenosis, lumbar region without neurogenic claudication: Secondary | ICD-10-CM | POA: Diagnosis not present

## 2022-05-17 DIAGNOSIS — M5137 Other intervertebral disc degeneration, lumbosacral region: Secondary | ICD-10-CM | POA: Diagnosis not present

## 2022-05-17 DIAGNOSIS — M5127 Other intervertebral disc displacement, lumbosacral region: Secondary | ICD-10-CM | POA: Diagnosis not present

## 2022-05-17 DIAGNOSIS — R531 Weakness: Secondary | ICD-10-CM | POA: Diagnosis not present

## 2022-05-17 DIAGNOSIS — R270 Ataxia, unspecified: Secondary | ICD-10-CM | POA: Diagnosis not present

## 2022-05-17 DIAGNOSIS — M47817 Spondylosis without myelopathy or radiculopathy, lumbosacral region: Secondary | ICD-10-CM | POA: Diagnosis not present

## 2022-06-14 DIAGNOSIS — G20C Parkinsonism, unspecified: Secondary | ICD-10-CM | POA: Diagnosis not present

## 2022-06-14 DIAGNOSIS — R531 Weakness: Secondary | ICD-10-CM | POA: Diagnosis not present

## 2022-06-14 DIAGNOSIS — Z9221 Personal history of antineoplastic chemotherapy: Secondary | ICD-10-CM | POA: Diagnosis not present

## 2022-06-14 DIAGNOSIS — R292 Abnormal reflex: Secondary | ICD-10-CM | POA: Diagnosis not present

## 2022-08-02 DIAGNOSIS — R2689 Other abnormalities of gait and mobility: Secondary | ICD-10-CM | POA: Diagnosis not present

## 2022-08-02 DIAGNOSIS — R4189 Other symptoms and signs involving cognitive functions and awareness: Secondary | ICD-10-CM | POA: Diagnosis not present

## 2022-08-02 DIAGNOSIS — R531 Weakness: Secondary | ICD-10-CM | POA: Diagnosis not present

## 2022-09-02 DIAGNOSIS — D692 Other nonthrombocytopenic purpura: Secondary | ICD-10-CM | POA: Diagnosis not present

## 2022-09-02 DIAGNOSIS — L570 Actinic keratosis: Secondary | ICD-10-CM | POA: Diagnosis not present

## 2022-09-02 DIAGNOSIS — L814 Other melanin hyperpigmentation: Secondary | ICD-10-CM | POA: Diagnosis not present

## 2022-09-02 DIAGNOSIS — D485 Neoplasm of uncertain behavior of skin: Secondary | ICD-10-CM | POA: Diagnosis not present

## 2022-09-02 DIAGNOSIS — Z85828 Personal history of other malignant neoplasm of skin: Secondary | ICD-10-CM | POA: Diagnosis not present

## 2022-09-02 DIAGNOSIS — L821 Other seborrheic keratosis: Secondary | ICD-10-CM | POA: Diagnosis not present

## 2022-09-02 DIAGNOSIS — L905 Scar conditions and fibrosis of skin: Secondary | ICD-10-CM | POA: Diagnosis not present

## 2022-11-01 DIAGNOSIS — R531 Weakness: Secondary | ICD-10-CM | POA: Diagnosis not present

## 2023-02-20 DIAGNOSIS — R809 Proteinuria, unspecified: Secondary | ICD-10-CM | POA: Diagnosis not present

## 2023-02-20 DIAGNOSIS — R82998 Other abnormal findings in urine: Secondary | ICD-10-CM | POA: Diagnosis not present

## 2023-02-20 DIAGNOSIS — E785 Hyperlipidemia, unspecified: Secondary | ICD-10-CM | POA: Diagnosis not present

## 2023-02-20 DIAGNOSIS — R972 Elevated prostate specific antigen [PSA]: Secondary | ICD-10-CM | POA: Diagnosis not present

## 2023-02-20 DIAGNOSIS — I10 Essential (primary) hypertension: Secondary | ICD-10-CM | POA: Diagnosis not present

## 2023-02-20 DIAGNOSIS — Z0189 Encounter for other specified special examinations: Secondary | ICD-10-CM | POA: Diagnosis not present

## 2023-02-20 DIAGNOSIS — Z0000 Encounter for general adult medical examination without abnormal findings: Secondary | ICD-10-CM | POA: Diagnosis not present

## 2023-02-20 DIAGNOSIS — R7301 Impaired fasting glucose: Secondary | ICD-10-CM | POA: Diagnosis not present

## 2023-02-27 DIAGNOSIS — R7301 Impaired fasting glucose: Secondary | ICD-10-CM | POA: Diagnosis not present

## 2023-02-27 DIAGNOSIS — I10 Essential (primary) hypertension: Secondary | ICD-10-CM | POA: Diagnosis not present

## 2023-02-27 DIAGNOSIS — Z0000 Encounter for general adult medical examination without abnormal findings: Secondary | ICD-10-CM | POA: Diagnosis not present

## 2023-02-27 DIAGNOSIS — Z85038 Personal history of other malignant neoplasm of large intestine: Secondary | ICD-10-CM | POA: Diagnosis not present

## 2023-02-27 DIAGNOSIS — E785 Hyperlipidemia, unspecified: Secondary | ICD-10-CM | POA: Diagnosis not present

## 2023-02-27 DIAGNOSIS — R2681 Unsteadiness on feet: Secondary | ICD-10-CM | POA: Diagnosis not present

## 2023-02-27 DIAGNOSIS — N3941 Urge incontinence: Secondary | ICD-10-CM | POA: Diagnosis not present

## 2023-04-14 DIAGNOSIS — R4189 Other symptoms and signs involving cognitive functions and awareness: Secondary | ICD-10-CM | POA: Diagnosis not present

## 2023-05-15 ENCOUNTER — Emergency Department (HOSPITAL_COMMUNITY): Payer: Medicare PPO

## 2023-05-15 ENCOUNTER — Encounter (HOSPITAL_COMMUNITY): Payer: Self-pay

## 2023-05-15 ENCOUNTER — Observation Stay (HOSPITAL_COMMUNITY): Payer: Medicare PPO

## 2023-05-15 ENCOUNTER — Other Ambulatory Visit: Payer: Self-pay

## 2023-05-15 ENCOUNTER — Inpatient Hospital Stay (HOSPITAL_COMMUNITY)
Admission: EM | Admit: 2023-05-15 | Discharge: 2023-05-21 | DRG: 041 | Disposition: A | Discharge status: Rehabilitation Facility | Payer: Medicare PPO | Attending: Family Medicine | Admitting: Family Medicine

## 2023-05-15 DIAGNOSIS — Z9842 Cataract extraction status, left eye: Secondary | ICD-10-CM | POA: Diagnosis not present

## 2023-05-15 DIAGNOSIS — Z993 Dependence on wheelchair: Secondary | ICD-10-CM | POA: Diagnosis not present

## 2023-05-15 DIAGNOSIS — Z87442 Personal history of urinary calculi: Secondary | ICD-10-CM | POA: Diagnosis not present

## 2023-05-15 DIAGNOSIS — B9620 Unspecified Escherichia coli [E. coli] as the cause of diseases classified elsewhere: Secondary | ICD-10-CM | POA: Diagnosis not present

## 2023-05-15 DIAGNOSIS — I10 Essential (primary) hypertension: Secondary | ICD-10-CM | POA: Diagnosis present

## 2023-05-15 DIAGNOSIS — D72823 Leukemoid reaction: Secondary | ICD-10-CM | POA: Diagnosis not present

## 2023-05-15 DIAGNOSIS — I5030 Unspecified diastolic (congestive) heart failure: Secondary | ICD-10-CM | POA: Diagnosis not present

## 2023-05-15 DIAGNOSIS — I69354 Hemiplegia and hemiparesis following cerebral infarction affecting left non-dominant side: Secondary | ICD-10-CM | POA: Diagnosis not present

## 2023-05-15 DIAGNOSIS — G319 Degenerative disease of nervous system, unspecified: Secondary | ICD-10-CM | POA: Diagnosis not present

## 2023-05-15 DIAGNOSIS — Z471 Aftercare following joint replacement surgery: Secondary | ICD-10-CM | POA: Diagnosis not present

## 2023-05-15 DIAGNOSIS — I6502 Occlusion and stenosis of left vertebral artery: Secondary | ICD-10-CM | POA: Diagnosis not present

## 2023-05-15 DIAGNOSIS — J90 Pleural effusion, not elsewhere classified: Secondary | ICD-10-CM | POA: Diagnosis present

## 2023-05-15 DIAGNOSIS — J9811 Atelectasis: Secondary | ICD-10-CM | POA: Diagnosis present

## 2023-05-15 DIAGNOSIS — I6523 Occlusion and stenosis of bilateral carotid arteries: Secondary | ICD-10-CM | POA: Diagnosis not present

## 2023-05-15 DIAGNOSIS — I77810 Thoracic aortic ectasia: Secondary | ICD-10-CM | POA: Diagnosis present

## 2023-05-15 DIAGNOSIS — Z9049 Acquired absence of other specified parts of digestive tract: Secondary | ICD-10-CM

## 2023-05-15 DIAGNOSIS — F0390 Unspecified dementia without behavioral disturbance: Secondary | ICD-10-CM | POA: Diagnosis present

## 2023-05-15 DIAGNOSIS — R4701 Aphasia: Secondary | ICD-10-CM | POA: Diagnosis present

## 2023-05-15 DIAGNOSIS — I6310 Cerebral infarction due to embolism of unspecified precerebral artery: Secondary | ICD-10-CM

## 2023-05-15 DIAGNOSIS — Z85038 Personal history of other malignant neoplasm of large intestine: Secondary | ICD-10-CM

## 2023-05-15 DIAGNOSIS — I6782 Cerebral ischemia: Secondary | ICD-10-CM | POA: Diagnosis not present

## 2023-05-15 DIAGNOSIS — R7303 Prediabetes: Secondary | ICD-10-CM | POA: Diagnosis present

## 2023-05-15 DIAGNOSIS — R410 Disorientation, unspecified: Principal | ICD-10-CM

## 2023-05-15 DIAGNOSIS — N390 Urinary tract infection, site not specified: Secondary | ICD-10-CM | POA: Diagnosis not present

## 2023-05-15 DIAGNOSIS — I69319 Unspecified symptoms and signs involving cognitive functions following cerebral infarction: Secondary | ICD-10-CM | POA: Diagnosis present

## 2023-05-15 DIAGNOSIS — T451X5A Adverse effect of antineoplastic and immunosuppressive drugs, initial encounter: Secondary | ICD-10-CM | POA: Diagnosis present

## 2023-05-15 DIAGNOSIS — Z7982 Long term (current) use of aspirin: Secondary | ICD-10-CM

## 2023-05-15 DIAGNOSIS — R4182 Altered mental status, unspecified: Secondary | ICD-10-CM | POA: Diagnosis not present

## 2023-05-15 DIAGNOSIS — Z8249 Family history of ischemic heart disease and other diseases of the circulatory system: Secondary | ICD-10-CM

## 2023-05-15 DIAGNOSIS — R4189 Other symptoms and signs involving cognitive functions and awareness: Secondary | ICD-10-CM | POA: Diagnosis present

## 2023-05-15 DIAGNOSIS — R918 Other nonspecific abnormal finding of lung field: Secondary | ICD-10-CM | POA: Diagnosis not present

## 2023-05-15 DIAGNOSIS — N3000 Acute cystitis without hematuria: Secondary | ICD-10-CM | POA: Diagnosis not present

## 2023-05-15 DIAGNOSIS — I6340 Cerebral infarction due to embolism of unspecified cerebral artery: Secondary | ICD-10-CM | POA: Diagnosis present

## 2023-05-15 DIAGNOSIS — Z9841 Cataract extraction status, right eye: Secondary | ICD-10-CM

## 2023-05-15 DIAGNOSIS — E785 Hyperlipidemia, unspecified: Secondary | ICD-10-CM | POA: Diagnosis present

## 2023-05-15 DIAGNOSIS — I6522 Occlusion and stenosis of left carotid artery: Secondary | ICD-10-CM | POA: Diagnosis present

## 2023-05-15 DIAGNOSIS — I639 Cerebral infarction, unspecified: Secondary | ICD-10-CM | POA: Diagnosis present

## 2023-05-15 DIAGNOSIS — Z96612 Presence of left artificial shoulder joint: Secondary | ICD-10-CM | POA: Diagnosis present

## 2023-05-15 DIAGNOSIS — G620 Drug-induced polyneuropathy: Secondary | ICD-10-CM | POA: Diagnosis present

## 2023-05-15 DIAGNOSIS — R262 Difficulty in walking, not elsewhere classified: Secondary | ICD-10-CM | POA: Diagnosis present

## 2023-05-15 DIAGNOSIS — Z807 Family history of other malignant neoplasms of lymphoid, hematopoietic and related tissues: Secondary | ICD-10-CM | POA: Diagnosis not present

## 2023-05-15 DIAGNOSIS — Z79899 Other long term (current) drug therapy: Secondary | ICD-10-CM | POA: Diagnosis not present

## 2023-05-15 DIAGNOSIS — R29704 NIHSS score 4: Secondary | ICD-10-CM | POA: Diagnosis present

## 2023-05-15 DIAGNOSIS — I69320 Aphasia following cerebral infarction: Secondary | ICD-10-CM | POA: Diagnosis not present

## 2023-05-15 DIAGNOSIS — E440 Moderate protein-calorie malnutrition: Secondary | ICD-10-CM | POA: Diagnosis not present

## 2023-05-15 DIAGNOSIS — G912 (Idiopathic) normal pressure hydrocephalus: Secondary | ICD-10-CM | POA: Diagnosis not present

## 2023-05-15 DIAGNOSIS — Z8673 Personal history of transient ischemic attack (TIA), and cerebral infarction without residual deficits: Secondary | ICD-10-CM

## 2023-05-15 DIAGNOSIS — I69393 Ataxia following cerebral infarction: Secondary | ICD-10-CM | POA: Diagnosis not present

## 2023-05-15 LAB — URINALYSIS, W/ REFLEX TO CULTURE (INFECTION SUSPECTED)
Bacteria, UA: NONE SEEN
Bilirubin Urine: NEGATIVE
Glucose, UA: NEGATIVE mg/dL
Hgb urine dipstick: NEGATIVE
Ketones, ur: NEGATIVE mg/dL
Leukocytes,Ua: NEGATIVE
Nitrite: NEGATIVE
Protein, ur: NEGATIVE mg/dL
Specific Gravity, Urine: 1.018 (ref 1.005–1.030)
pH: 6 (ref 5.0–8.0)

## 2023-05-15 LAB — HEMOGLOBIN A1C
Hgb A1c MFr Bld: 6 % — ABNORMAL HIGH (ref 4.8–5.6)
Mean Plasma Glucose: 125.5 mg/dL

## 2023-05-15 LAB — CBC WITH DIFFERENTIAL/PLATELET
Abs Immature Granulocytes: 0.04 10*3/uL (ref 0.00–0.07)
Basophils Absolute: 0 10*3/uL (ref 0.0–0.1)
Basophils Relative: 1 %
Eosinophils Absolute: 0.2 10*3/uL (ref 0.0–0.5)
Eosinophils Relative: 2 %
HCT: 44.4 % (ref 39.0–52.0)
Hemoglobin: 14.9 g/dL (ref 13.0–17.0)
Immature Granulocytes: 1 %
Lymphocytes Relative: 17 %
Lymphs Abs: 1.3 10*3/uL (ref 0.7–4.0)
MCH: 30.7 pg (ref 26.0–34.0)
MCHC: 33.6 g/dL (ref 30.0–36.0)
MCV: 91.5 fL (ref 80.0–100.0)
Monocytes Absolute: 0.8 10*3/uL (ref 0.1–1.0)
Monocytes Relative: 10 %
Neutro Abs: 5.4 10*3/uL (ref 1.7–7.7)
Neutrophils Relative %: 69 %
Platelets: 281 10*3/uL (ref 150–400)
RBC: 4.85 MIL/uL (ref 4.22–5.81)
RDW: 12 % (ref 11.5–15.5)
WBC: 7.8 10*3/uL (ref 4.0–10.5)
nRBC: 0 % (ref 0.0–0.2)

## 2023-05-15 LAB — LIPID PANEL
Cholesterol: 190 mg/dL (ref 0–200)
HDL: 48 mg/dL (ref >40)
LDL Cholesterol: 124 mg/dL — ABNORMAL HIGH (ref 0–99)
Total CHOL/HDL Ratio: 4 {ratio}
Triglycerides: 90 mg/dL (ref <150)
VLDL: 18 mg/dL (ref 0–40)

## 2023-05-15 LAB — COMPREHENSIVE METABOLIC PANEL
ALT: 13 U/L (ref 0–44)
AST: 16 U/L (ref 15–41)
Albumin: 3.5 g/dL (ref 3.5–5.0)
Alkaline Phosphatase: 84 U/L (ref 38–126)
Anion gap: 7 (ref 5–15)
BUN: 13 mg/dL (ref 8–23)
CO2: 24 mmol/L (ref 22–32)
Calcium: 8.9 mg/dL (ref 8.9–10.3)
Chloride: 109 mmol/L (ref 98–111)
Creatinine, Ser: 0.73 mg/dL (ref 0.61–1.24)
GFR, Estimated: >60 mL/min (ref >60)
Glucose, Bld: 121 mg/dL — ABNORMAL HIGH (ref 70–99)
Potassium: 4 mmol/L (ref 3.5–5.1)
Sodium: 140 mmol/L (ref 135–145)
Total Bilirubin: 0.8 mg/dL (ref 0.0–1.2)
Total Protein: 6.8 g/dL (ref 6.5–8.1)

## 2023-05-15 LAB — MAGNESIUM: Magnesium: 2.1 mg/dL (ref 1.7–2.4)

## 2023-05-15 LAB — VITAMIN B12: Vitamin B-12: 615 pg/mL (ref 180–914)

## 2023-05-15 LAB — FOLATE: Folate: 12.6 ng/mL (ref >5.9)

## 2023-05-15 LAB — HIV ANTIBODY (ROUTINE TESTING W REFLEX): HIV Screen 4th Generation wRfx: NONREACTIVE

## 2023-05-15 LAB — SEDIMENTATION RATE: Sed Rate: 1 mm/h (ref 0–16)

## 2023-05-15 LAB — TSH: TSH: 1.49 u[IU]/mL (ref 0.350–4.500)

## 2023-05-15 MED ORDER — STROKE: EARLY STAGES OF RECOVERY BOOK
Freq: Once | Status: AC
  Administered 2023-05-16: 1
  Filled 2023-05-15: qty 1

## 2023-05-15 MED ORDER — ASPIRIN 81 MG PO TBEC
81.0000 mg | DELAYED_RELEASE_TABLET | Freq: Every day | ORAL | Status: DC
Start: 2023-05-15 — End: 2023-05-21
  Administered 2023-05-15 – 2023-05-21 (×7): 81 mg via ORAL
  Filled 2023-05-15 (×7): qty 1

## 2023-05-15 MED ORDER — HYDRALAZINE HCL 20 MG/ML IJ SOLN
10.0000 mg | INTRAMUSCULAR | Status: DC | PRN
Start: 2023-05-15 — End: 2023-05-21

## 2023-05-15 MED ORDER — ACETAMINOPHEN 325 MG PO TABS: 650.0000 mg | ORAL_TABLET | ORAL | Status: DC | PRN

## 2023-05-15 MED ORDER — CLOPIDOGREL BISULFATE 75 MG PO TABS
75.0000 mg | ORAL_TABLET | Freq: Every day | ORAL | Status: DC
  Administered 2023-05-15 – 2023-05-21 (×7): 75 mg via ORAL
  Filled 2023-05-15 (×7): qty 1

## 2023-05-15 MED ORDER — ACETAMINOPHEN 160 MG/5ML PO SOLN: 650.0000 mg | ORAL | Status: DC | PRN

## 2023-05-15 MED ORDER — ROSUVASTATIN CALCIUM 20 MG PO TABS
20.0000 mg | ORAL_TABLET | Freq: Every day | ORAL | Status: DC
  Administered 2023-05-16 – 2023-05-21 (×6): 20 mg via ORAL
  Filled 2023-05-15 (×6): qty 1

## 2023-05-15 MED ORDER — ENOXAPARIN SODIUM 40 MG/0.4ML IJ SOSY
40.0000 mg | PREFILLED_SYRINGE | INTRAMUSCULAR | Status: DC
  Administered 2023-05-15 – 2023-05-20 (×6): 40 mg via SUBCUTANEOUS
  Filled 2023-05-15 (×6): qty 0.4

## 2023-05-15 MED ORDER — IOHEXOL 350 MG/ML SOLN
75.0000 mL | Freq: Once | INTRAVENOUS | Status: AC | PRN
  Administered 2023-05-15: 75 mL via INTRAVENOUS

## 2023-05-15 MED ORDER — ONDANSETRON HCL 4 MG/2ML IJ SOLN: 4.0000 mg | Freq: Four times a day (QID) | INTRAMUSCULAR | Status: DC | PRN

## 2023-05-15 MED ORDER — SENNOSIDES-DOCUSATE SODIUM 8.6-50 MG PO TABS
1.0000 | ORAL_TABLET | Freq: Every evening | ORAL | Status: DC | PRN
  Administered 2023-05-16: 1 via ORAL
  Filled 2023-05-15: qty 1

## 2023-05-15 MED ORDER — ACETAMINOPHEN 650 MG RE SUPP: 650.0000 mg | RECTAL | Status: DC | PRN

## 2023-05-15 NOTE — ED Notes (Addendum)
 Neurology at bedside.

## 2023-05-15 NOTE — ED Provider Notes (Signed)
  Physical Exam  BP (!) 145/76   Pulse 63   Temp 98.8 F (37.1 C)   Resp 20   Ht 5' 10 (1.778 m)   Wt 75.8 kg   SpO2 95%   BMI 23.96 kg/m   Physical Exam Vitals and nursing note reviewed.  Constitutional:      General: He is not in acute distress.    Appearance: Normal appearance.  HENT:     Head: Normocephalic and atraumatic.  Eyes:     General:        Right eye: No discharge.        Left eye: No discharge.  Cardiovascular:     Comments: Regular rate and rhythm.  S1/S2 are distinct without any evidence of murmur, rubs, or gallops.  Radial pulses are 2+ bilaterally.  Dorsalis pedis pulses are 2+ bilaterally.  No evidence of pedal edema. Pulmonary:     Comments: Clear to auscultation bilaterally.  Normal effort.  No respiratory distress.  No evidence of wheezes, rales, or rhonchi heard throughout. Abdominal:     General: Abdomen is flat. Bowel sounds are normal. There is no distension.     Tenderness: There is no abdominal tenderness. There is no guarding or rebound.  Musculoskeletal:        General: Normal range of motion.     Cervical back: Neck supple.  Skin:    General: Skin is warm and dry.     Findings: No rash.  Neurological:     General: No focal deficit present.     Mental Status: He is alert.     Comments: 5/5 strength to the upper and lower extremities.  Normal sensation to the upper and lower extremities.  Cranial nerves II through XII are intact.  Speech is normal for me.  Psychiatric:        Mood and Affect: Mood normal.        Behavior: Behavior normal.     Procedures  Procedures  ED Course / MDM   Clinical Course as of 05/15/23 1601  Thu May 15, 2023  1549 I spoke with Dr. Deedra with neurology who agrees to consult. [CF]  1600 I spoke with Dr. Tobie with [facility] hospitalist who agrees admit the patient. [CF]    Clinical Course User Index [CF] Theotis Cameron HERO, PA-C   Medical Decision Making Amount and/or Complexity of Data Reviewed Labs:  ordered. Radiology: ordered.  Risk Decision regarding hospitalization.   Accepted handoff at shift change from Kehril PA-C. Please see prior provider note for more detail.   Briefly: Patient is 83 y.o. coming in with intermittent questionable expressive aphasia.  Last known normal was approximate 9 PM last night.  DDX: concern for stroke versus undiagnosed dementia.  Plan: MRI shows signs of subacute stroke.  Plan to admit to the hospitalist service with neuro consult.              Theotis Cameron HERO, PA-C 05/15/23 1610    Bari Roxie HERO, DO 05/15/23 1830

## 2023-05-15 NOTE — ED Notes (Signed)
 Patient transported to CT

## 2023-05-15 NOTE — ED Provider Notes (Signed)
 [place] [facility] EMERGENCY DEPARTMENT AT [facility] Provider Note   CSN: [id number] Arrival date & time: 05/15/23  1036     History  Chief Complaint  Patient presents with   Altered Mental Status    Jared [name]. is a 83 y.o. male.  Jared [name]. is a 83 y.o. male with a history of hypertension, prediabetes, neuropathy, colon cancer and cognitive decline, who presents to the ED via EMS for evaluation of worsening confusion.  EMS reports that when patient woke this morning he was significantly more confused.  When asked why he is here patient reports that he does not know.  Spoke with patient's wife over the phone that reports when he woke up this morning he was significantly more confused than usual with some garbled nonsensical speech, had difficulty answering typical questions and was generally more weak and having trouble getting up and moving.  She reports that he has trouble walking at baseline but this morning was worse than usual.  She did not notice weakness or localize to 1 side of the body.  She denies any fevers or recent illness.  She has not noted any recent cough and patient has not complained of chest pain or shortness of breath.  EMS reports that with them he did not have any obvious neurologic deficits but had difficulty identifying objects.  The history is provided by the patient, the spouse, the EMS personnel and medical records.  Altered Mental Status      Home Medications Prior to Admission medications   Medication Sig Start Date End Date Taking? Authorizing Provider  Alpha-Lipoic Acid 600 MG CAPS Take 600 mg by mouth daily.    [provider]  AMINO ACIDS COMPLEX PO Take 2 Scoops by mouth daily.    [provider]  Ascorbic Acid (VITAMIN C) 1000 MG tablet Take 1,000 mg by mouth daily.    [provider]  b complex vitamins capsule Take 1 capsule by mouth daily.    [provider]  Barberry-Oreg Grape-Goldenseal  (BERBERINE COMPLEX PO) Take 3 capsules by mouth daily.    [provider]  calcium  carbonate (OS-CAL) 600 MG TABS tablet Take 600 mg by mouth daily with breakfast.    [provider]  Capsicum, Cayenne, (CAYENNE PO) Take 3.4 g by mouth daily.    [provider]  Cholecalciferol (VITAMIN D3) 125 MCG (5000 UT) CAPS Take 5,000 Units by mouth daily.    [provider]  Chromium  1 MG CAPS Take 1 capsule (1 mg total) by mouth daily. 11/29/20   McBane, Caroline N, PA-C  Cinnamon 500 MG capsule Take 500 mg by mouth daily.    [provider]  CITRULLINE PO Take 1 Scoop by mouth daily.    [provider]  Coenzyme Q10 (COQ10) 100 MG CAPS Take 100 mg by mouth daily.    [provider]  COLLAGEN PO Take 1 Scoop by mouth daily.    [provider]  CRANBERRY EXTRACT PO Take 1 capsule by mouth daily.    [provider]  Fe Bisgly-Vit C-Vit B12-FA (GENTLE IRON PO) Take 1 capsule by mouth 2 (two) times daily with a meal.    [provider]  Garlic (GARLIQUE PO) Take 2 capsules by mouth daily.    [provider]  Ginger, Zingiber officinalis, (GINGER PO) Take 5 g by mouth daily.    [provider]  GINSENG PO Take 100 mg by mouth daily.  [provider]  Iodine, Kelp, (KELP PO) Take 225 mcg by mouth daily.    [provider]  NON FORMULARY Take 1 capsule by mouth See admin instructions. Gymnema sylvestre capsule- Take 1 capsule by mouth once a day as needed to block sugar    [provider]  OVER THE COUNTER MEDICATION Take 3 capsules by mouth daily. [employer], Historical, MD  OVER THE COUNTER MEDICATION Take 5 g by mouth See admin instructions. Beet Powder- Mix 1 heaping teaspoonful into desired beverage and drink once a day    [provider]  OVER THE COUNTER MEDICATION Take 1 Scoop by mouth daily. Powdered Greens    [provider]  OVER THE  COUNTER MEDICATION Take 4 capsules by mouth See admin instructions. Beta Sistosterol- Take 4 capsules by mouth once a day    [provider]  PAPAYA ENZYME PO Take 1 capsule by mouth daily as needed (TO AID DIGESTION).    [provider]  Potassium 99 MG TABS Take 99 mg by mouth daily.    [provider]  RESVERATROL PO Take 1,000 mg by mouth daily.    [provider]  Saw Palmetto, Serenoa repens, (SAW PALMETTO PO) Take 540 mg by mouth daily.    [provider]  Turmeric 500 MG CAPS Take 500 mg by mouth daily.    [provider]  vitamin E 180 MG (400 UNITS) capsule Take 400 Units by mouth daily.    [provider]  zinc  gluconate 50 MG tablet Take 50 mg by mouth daily.    [provider]      Allergies    Patient has no known allergies.    Review of Systems   Review of Systems  Unable to perform ROS: Mental status change    Physical Exam Updated Vital Signs BP (!) 148/86   Pulse 61   Temp 98 F (36.7 C) (Oral)   Resp 18   Ht 5' 10 (1.778 m)   Wt 75.8 kg   SpO2 99%   BMI 23.96 kg/m  Physical Exam Vitals and nursing note reviewed.  Constitutional:      General: He is not in acute distress.    Appearance: Normal appearance. He is well-developed. He is not diaphoretic.  HENT:     Head: Normocephalic and atraumatic.  Eyes:     General:        Right eye: No discharge.        Left eye: No discharge.     Pupils: Pupils are equal, round, and reactive to light.  Cardiovascular:     Rate and Rhythm: Normal rate and regular rhythm.     Pulses: Normal pulses.     Heart sounds: Normal heart sounds.  Pulmonary:     Effort: Pulmonary effort is normal. No respiratory distress.     Breath sounds: Normal breath sounds. No wheezing or rales.     Comments: Respirations equal and unlabored, patient able to speak in full sentences, lungs clear to auscultation bilaterally  Abdominal:     General: Bowel sounds are  normal. There is no distension.     Palpations: Abdomen is soft. There is no mass.     Tenderness: There is no abdominal tenderness. There is no guarding.     Comments: Abdomen soft, nondistended, nontender to palpation in all quadrants without guarding or peritoneal signs  Musculoskeletal:        General: No  deformity.     Cervical back: Neck supple.  Skin:    General: Skin is warm and dry.     Capillary Refill: Capillary refill takes less than 2 seconds.  Neurological:     Mental Status: He is alert.     Coordination: Coordination normal.     Comments: Patient is alert, oriented to self and can report his date of birth but is not oriented to time or place.  He is able to name some objects such as his wristwatch and a chair but is unable to identify a telephone or stethoscope. CN III-XII intact Normal strength in upper and lower extremities bilaterally including dorsiflexion and plantar flexion, strong and equal grip strength Sensation normal to light and sharp touch Moves extremities without ataxia, coordination intact  Psychiatric:        Mood and Affect: Mood normal.        Behavior: Behavior normal.     ED Results / Procedures / Treatments   Labs (all labs ordered are listed, but only abnormal results are displayed) Labs Reviewed  COMPREHENSIVE METABOLIC PANEL - Abnormal; Notable for the following components:      Result Value   Glucose, Bld 121 (*)    All other components within normal limits  CBC WITH DIFFERENTIAL/PLATELET  URINALYSIS, W/ REFLEX TO CULTURE (INFECTION SUSPECTED)  MAGNESIUM    EKG None  Radiology CT Head Wo Contrast Result Date: 05/15/2023 CLINICAL DATA:  Altered mental status. EXAM: CT HEAD WITHOUT CONTRAST TECHNIQUE: Contiguous axial images were obtained from the base of the skull through the vertex without intravenous contrast. RADIATION DOSE REDUCTION: This exam was performed according to the departmental dose-optimization program which includes  automated exposure control, adjustment of the mA and/or kV according to patient size and/or use of iterative reconstruction technique. COMPARISON:  MRI brain dated March 08, 2021. FINDINGS: Brain: No evidence of acute infarction, hemorrhage, hydrocephalus, extra-axial collection or mass lesion/mass effect. Stable advanced atrophy and chronic microvascular ischemic changes. Vascular: Calcified atherosclerosis at the skull base. No hyperdense vessel. Skull: Normal. Negative for fracture or focal lesion. Sinuses/Orbits: No acute finding. Other: None. IMPRESSION: 1. No acute intracranial abnormality. 2. Stable advanced atrophy and chronic microvascular ischemic changes. Electronically Signed   By: Elsie ONEIDA Shoulder M.D.   On: 05/15/2023 11:43   DG Chest Port 1 View Result Date: 05/15/2023 CLINICAL DATA:  Altered mental status. EXAM: PORTABLE CHEST 1 VIEW COMPARISON:  None Available. FINDINGS: The heart size and mediastinal contours are within normal limits. Status post left total shoulder arthroplasty. Right lung is clear. Mild left basilar atelectasis or infiltrate is noted with small left pleural effusion. IMPRESSION: Mild left basilar subsegmental atelectasis or infiltrate is noted with small left pleural effusion. Followup PA and lateral chest X-ray is recommended in 3-4 weeks following trial of antibiotic therapy to ensure resolution and exclude underlying malignancy. Electronically Signed   By: Lynwood Landy Raddle M.D.   On: 05/15/2023 11:30    Procedures Procedures    Medications Ordered in ED Medications - No data to display  ED Course/ Medical Decision Making/ A&P                                 Medical Decision Making Amount and/or Complexity of Data Reviewed Labs: ordered. Radiology: ordered.   83 year old male presents with increased confusion and word finding difficulties, last known well time 9 PM last night.  No other neurologic  deficits noted on exam.  Patient with normal vitals and no  obvious infectious symptoms.  Differential includes stroke, intracranial hemorrhage, intracranial mass, progressing dementia or cognitive deficit, metabolic derangement, infection.  Labs overall reassuring with no evidence of leukocytosis, anemia, electrolyte derangement, urinalysis without evidence of UTI.  Imaging viewed and interpreted independently, agree with radiologist findings.  Chest x-ray with possible left basilar atelectasis or infiltrate, could consider pneumonia.  Head CT with no acute intracranial abnormality and stable advanced atrophy.  Case discussed with Dr. Vanessa with neurology, who recommends MRI, could be underlying dementia and cognitive decline versus isolated aphasia.  If MRI is negative patient can likely be discharged home with outpatient neuro follow-up.  Given findings on chest x-ray if MRI is normal would consider antibiotic treatment.  I discussed results and plan for MRI with wife at bedside who is in agreement.  At shift change care signed out to PA [facility] who will follow-up on pending MRI and disposition appropriately.          Final Clinical Impression(s) / ED Diagnoses Final diagnoses:  Confusion    Rx / DC Orders ED Discharge Orders     None         Alva Larraine FALCON, PA-C 05/15/23 1525    Elnor Hila P, DO 05/29/23 0725

## 2023-05-15 NOTE — ED Notes (Signed)
 Patient transported to MRI

## 2023-05-15 NOTE — H&P (Signed)
 History and Physical    Jared [name]. FMW:[id number] DOB: [birth year] DOA: 05/15/2023  PCP: Yolande Toribio MATSU, MD  Patient coming from: Home  I have personally briefly reviewed patient's old medical records in [facility]  Chief Complaint: Altered mental status, speech abnormality  HPI: Jared Kohrs. is a 83 y.o. male with medical history significant for HTN, cognitive impairment, remote colon cancer with prior right hemicolectomy who presented to the ED for evaluation of abnormal speech and increased confusion from baseline.  History is supplemented by his spouse at bedside.  Patient has chronic ambulatory dysfunction, uses a motorized scooter to transport in the house.  He has baseline memory issues.  He was LKW 2100 last night 05/14/2023.  This morning when he awoke he had new garbled speech and difficulty saying what he wanted to say.  He seemed more confused than his usual baseline.  He did not have any new obvious weakness in his extremities.  He does not take any prescription medications or aspirin .  He takes multiple over-the-counter vitamins and supplements.  ED Course  Labs/Imaging on admission: I have personally reviewed following labs and imaging studies.  Initial vitals showed BP 117/72, pulse 70, RR 19, temp 98.0 F, SpO2 99% on room air.  Labs showed sodium 140, potassium 4.0, bicarb 24, BUN 13, creatinine 0.73, serum glucose 121, LFTs within normal limits, WBC 7.8, hemoglobin 14.9, platelets 281,000, magnesium 2.1.  UA negative for UTI.  Portable chest x-ray showed mild left basilar subsegmental atelectasis or infiltrate with small left pleural effusion.  CT head without contrast negative for acute intracranial abnormality.  Stable advanced atrophy and chronic microvascular ischemic changes noted.  MRI brain without contrast showed small acute or early subacute infarcts in the left parietal and right frontal and occipital lobes.  Given involvement of multiple  vascular territories considered embolic etiology.  Advanced chronic microvascular ischemic changes and cerebral atrophy also noted.  Neurology were consulted and recommended medical admission for further stroke workup.  The hospitalist service was consulted to admit.  Review of Systems: All systems reviewed and are negative except as documented in history of present illness above.   Past Medical History:  Diagnosis Date   colon cancer    History of kidney stones    Hypertension    Patient denies   Neuropathy due to chemotherapeutic drug [facility])    Neurologist says doesnt have it   Pre-diabetes     Past Surgical History:  Procedure Laterality Date   CATARACT EXTRACTION, BILATERAL     COLON SURGERY  2004   R hemicolectomy - removed due to cancer   COLONOSCOPY     HEMICOLECTOMY     HERNIA REPAIR  2005   REVERSE SHOULDER ARTHROPLASTY Left 11/29/2020   Procedure: REVERSE SHOULDER ARTHROPLASTY;  Surgeon: Cristy Bonner DASEN, MD;  Location: [facility] ORS;  Service: Orthopedics;  Laterality: Left;    Social History:  reports that he has never smoked. He has never used smokeless tobacco. He reports current alcohol  use of about 1.0 standard drink of alcohol  per week. He reports that he does not use drugs.  No Known Allergies  Family History  Problem Relation Age of Onset   Other Mother        old age   Hodgkin's lymphoma Mother    Heart failure Father      Prior to Admission medications   Medication Sig Start Date End Date Taking? Authorizing Provider  Alpha-Lipoic Acid 600 MG CAPS  Take 600 mg by mouth daily.    [provider]  AMINO ACIDS COMPLEX PO Take 2 Scoops by mouth daily.    [provider]  Ascorbic Acid (VITAMIN C) 1000 MG tablet Take 1,000 mg by mouth daily.    [provider]  b complex vitamins capsule Take 1 capsule by mouth daily.    [provider]  Barberry-Oreg Grape-Goldenseal (BERBERINE COMPLEX PO) Take 3 capsules by mouth daily.     [provider]  calcium  carbonate (OS-CAL) 600 MG TABS tablet Take 600 mg by mouth daily with breakfast.    [provider]  Capsicum, Cayenne, (CAYENNE PO) Take 3.4 g by mouth daily.    [provider]  Cholecalciferol (VITAMIN D3) 125 MCG (5000 UT) CAPS Take 5,000 Units by mouth daily.    [provider]  Chromium  1 MG CAPS Take 1 capsule (1 mg total) by mouth daily. 11/29/20   McBane, Caroline N, PA-C  Cinnamon 500 MG capsule Take 500 mg by mouth daily.    [provider]  CITRULLINE PO Take 1 Scoop by mouth daily.    [provider]  Coenzyme Q10 (COQ10) 100 MG CAPS Take 100 mg by mouth daily.    [provider]  COLLAGEN PO Take 1 Scoop by mouth daily.    [provider]  CRANBERRY EXTRACT PO Take 1 capsule by mouth daily.    [provider]  Fe Bisgly-Vit C-Vit B12-FA (GENTLE IRON PO) Take 1 capsule by mouth 2 (two) times daily with a meal.    [provider]  Garlic (GARLIQUE PO) Take 2 capsules by mouth daily.    [provider]  Ginger, Zingiber officinalis, (GINGER PO) Take 5 g by mouth daily.    [provider]  GINSENG PO Take 100 mg by mouth daily.    [provider]  Iodine, Kelp, (KELP PO) Take 225 mcg by mouth daily.    [provider]  NON FORMULARY Take 1 capsule by mouth See admin instructions. Gymnema sylvestre capsule- Take 1 capsule by mouth once a day as needed to block sugar    [provider]  OVER THE COUNTER MEDICATION Take 3 capsules by mouth daily. [employer], Historical, MD  OVER THE COUNTER MEDICATION Take 5 g by mouth See admin instructions. Beet Powder- Mix 1 heaping teaspoonful into desired beverage and drink once a day    [provider]  OVER THE COUNTER MEDICATION Take 1 Scoop by mouth daily. Powdered Greens    [provider]  OVER THE COUNTER MEDICATION Take 4 capsules by mouth See admin  instructions. Beta Sistosterol- Take 4 capsules by mouth once a day    [provider]  PAPAYA ENZYME PO Take 1 capsule by mouth daily as needed (TO AID DIGESTION).    [provider]  Potassium 99 MG TABS Take 99 mg by mouth daily.    [provider]  RESVERATROL PO Take 1,000 mg by mouth daily.    [provider]  Saw Palmetto, Serenoa repens, (SAW PALMETTO PO) Take 540 mg by mouth daily.    [provider]  Turmeric 500 MG CAPS Take 500 mg by mouth daily.    [provider]  vitamin E 180 MG (400 UNITS) capsule Take 400 Units by mouth daily.    [provider]  zinc  gluconate 50 MG tablet Take 50 mg by mouth daily.    [provider]    Physical Exam: Vitals:   05/15/23 1040 05/15/23 1040 05/15/23 1230 05/15/23 1545  BP:  117/72 (!) 148/86 (!) 145/76  Pulse:   61 63  Resp:   18 20  Temp:    98.8 F (37.1 C)  TempSrc:      SpO2:   99% 95%  Weight: 75.8 kg 75.8 kg    Height: 5' 10 (1.778 m) 5' 10 (1.778 m)     Constitutional: Resting in bed, NAD, calm, comfortable Eyes: PERRL, EOMI, lids and conjunctivae normal ENMT: Mucous membranes are moist. Posterior pharynx clear of any exudate or lesions.Normal dentition.  Neck: normal, supple, no masses. Respiratory: clear to auscultation bilaterally, no wheezing, no crackles. Normal respiratory effort. No accessory muscle use.  Cardiovascular: Regular rate and rhythm, soft systolic murmur. No extremity edema. 2+ pedal pulses. Abdomen: no tenderness, no masses palpated.  Musculoskeletal: no clubbing / cyanosis. No joint deformity upper and lower extremities. Good ROM, no contractures. Normal muscle tone.  Skin: no rashes, lesions, ulcers. No induration Neurologic: CN 2-12 grossly intact. Sensation intact. Strength 5/5 in all 4.  FTN intact. Psychiatric: Alert and oriented to person, place but not year or situation.  Denies visual or auditory hallucinations.  EKG:  Personally reviewed. Sinus rhythm, rate 70, no acute ischemic changes no prior for comparison.  Assessment/Plan Principal Problem:   Acute CVA (cerebrovascular accident) [facility]) Active Problems:   Hypertension   Atsushi Yom. is a 83 y.o. male with medical history significant for HTN, cognitive impairment, remote colon cancer with prior right hemicolectomy who is admitted with acute to subacute infarcts of the left parietal and right frontal/occipital lobes.  Assessment and Plan: Acute/subacute infarcts involving left parietal and right frontal/occipital lobes: Noted on MRI brain, potentially embolic source. -Neurology following -CTA head and neck -Obtain echocardiogram -Keep on telemetry, continue neurochecks -PT/OT/SLP eval -Started on aspirin  81 mg and Plavix  75 mg daily per neurology -Follow A1c, lipid panel  Hypertension: Not on antihypertensives as an outpatient.  BP is stable.  IV hydralazine  ordered as needed for SBP >220.   DVT prophylaxis: enoxaparin  (LOVENOX ) injection 40 mg Start: 05/15/23 1800 Code Status: Full code, confirmed with patient and spouse on admission Family Communication: Spouse at bedside Disposition Plan: From home, dispo pending clinical progress Consults called: Neurology Severity of Illness: The appropriate patient status for this patient is OBSERVATION. Observation status is judged to be reasonable and necessary in order to provide the required intensity of service to ensure the patient's safety. The patient's presenting symptoms, physical exam findings, and initial radiographic and laboratory data in the context of their medical condition is felt to place them at decreased risk for further clinical deterioration. Furthermore, it is anticipated that the patient will be medically stable for discharge from the hospital within 2 midnights of admission.   Jorie Blanch MD [facility]  If 7PM-7AM, please contact  night-coverage [email or web address]  05/15/2023, 5:53 PM

## 2023-05-15 NOTE — Consult Note (Signed)
 NEUROLOGY CONSULT NOTE   Date of service: May 15, 2023 Patient Name: Jared [name]. MRN:  [id number] DOB:  Sep 02, 1940 Chief Complaint: altered mental status Requesting Provider: Bari Roxie HERO, DO  History of Present Illness  Ben Habermann. is a 83 y.o. male with past medical history significant for hypertension, cognitive decline, colon cancer, neuropathy ? due to chemotherapy, kidney stones presented to the ED for evaluation of worsening confusion.  Per EMS patient was slightly confused upon awakening this morning.  Emergency room doctor spoke with wife over the phone and she states that he had garbled nonsensical speech with difficulty answering questions with increased weakness, but not weak on one side over another. Denies history of stroke, atrial fibrillation, does not take blood thinners.  MRI shows small acute/early subacute infarcts in left parietal and right frontal and occipital lobes.  On exam, patient is confused to place, time, age. He often has delayed speech and poor attention and concentration. Wife at bedside states that this confusion has been ongoing for the past 2-3 years, but was worse this morning accompanied by garbled speech which is unusual for him. Speech on exam was clear. Difficulty naming objects and repeating.   Wife states that at baseline patient uses a scooter at home due to inability to walk. Patient is abel to lift legs bilaterally against resistance and has strong plantar and dorsal flexion. He has seen physical therapists with no improvement. No sensory deficits seen bilateral lower extremities, including on bottom of feet.   Patient recently had an outpatient MRI at the [facility] (04/14/2023), ordered for ataxia, gait abnormality, cognitive decline. It showed no acute abnormality, but noted concern for NPH with prior lacunar infarcts.He underwent an LP to further evaluate for NPH. Opening pressure is not in chart review, labs unremarkable. Recent labs  completed at [facility] within the past few months include ANA (negative), heavy metals screen (negative), B12 (WNL), LDL (122).   Patient had a neurology visit June 2024 for evaluation of gait difficulty. Appointment notes state that neuropathy started while on chemotherapy in 2019, walking difficulties started in 2019, began falling in 2021 and fell and broke his shoulder. Has been using a scooter for the past 2 years. Noted that patient described numbness to bilateral feet. (This was not appreciated during today's exam). Patient has tried Sinement, which was ineffective. Note also states that patient was seen outpatient by [facility] and evaluated for NPH and enlarged ventricles, no notations seen in chart from [facility]. Neurology recommendations from this appointment were to refer patient to NPH clinic with Dr. Vona.     ROS  Comprehensive ROS performed and pertinent positives documented in HPI   Past History   Past Medical History:  Diagnosis Date   colon cancer    History of kidney stones    Hypertension    Patient denies   Neuropathy due to chemotherapeutic drug [facility])    Neurologist says doesnt have it   Pre-diabetes     Past Surgical History:  Procedure Laterality Date   CATARACT EXTRACTION, BILATERAL     COLON SURGERY  2004   R hemicolectomy - removed due to cancer   COLONOSCOPY     HEMICOLECTOMY     HERNIA REPAIR  2005   REVERSE SHOULDER ARTHROPLASTY Left 11/29/2020   Procedure: REVERSE SHOULDER ARTHROPLASTY;  Surgeon: Cristy Bonner DASEN, MD;  Location: [facility] ORS;  Service: Orthopedics;  Laterality: Left;    Family History: Family History  Problem Relation Age of  Onset   Other Mother        old age   Hodgkin's lymphoma Mother    Heart failure Father     Social History  reports that he has never smoked. He has never used smokeless tobacco. He reports current alcohol  use of about 1.0 standard drink of alcohol  per week. He reports that he does not use drugs.  No Known  Allergies  Medications  No current facility-administered medications for this encounter.  Current Outpatient Medications:    Alpha-Lipoic Acid 600 MG CAPS, Take 600 mg by mouth daily., Disp: , Rfl:    AMINO ACIDS COMPLEX PO, Take 2 Scoops by mouth daily., Disp: , Rfl:    Ascorbic Acid (VITAMIN C) 1000 MG tablet, Take 1,000 mg by mouth daily., Disp: , Rfl:    b complex vitamins capsule, Take 1 capsule by mouth daily., Disp: , Rfl:    Barberry-Oreg Grape-Goldenseal (BERBERINE COMPLEX PO), Take 3 capsules by mouth daily., Disp: , Rfl:    calcium  carbonate (OS-CAL) 600 MG TABS tablet, Take 600 mg by mouth daily with breakfast., Disp: , Rfl:    Capsicum, Cayenne, (CAYENNE PO), Take 3.4 g by mouth daily., Disp: , Rfl:    Cholecalciferol (VITAMIN D3) 125 MCG (5000 UT) CAPS, Take 5,000 Units by mouth daily., Disp: , Rfl:    Chromium  1 MG CAPS, Take 1 capsule (1 mg total) by mouth daily., Disp: , Rfl:    Cinnamon 500 MG capsule, Take 500 mg by mouth daily., Disp: , Rfl:    CITRULLINE PO, Take 1 Scoop by mouth daily., Disp: , Rfl:    Coenzyme Q10 (COQ10) 100 MG CAPS, Take 100 mg by mouth daily., Disp: , Rfl:    COLLAGEN PO, Take 1 Scoop by mouth daily., Disp: , Rfl:    CRANBERRY EXTRACT PO, Take 1 capsule by mouth daily., Disp: , Rfl:    Fe Bisgly-Vit C-Vit B12-FA (GENTLE IRON PO), Take 1 capsule by mouth 2 (two) times daily with a meal., Disp: , Rfl:    Garlic (GARLIQUE PO), Take 2 capsules by mouth daily., Disp: , Rfl:    Ginger, Zingiber officinalis, (GINGER PO), Take 5 g by mouth daily., Disp: , Rfl:    GINSENG PO, Take 100 mg by mouth daily., Disp: , Rfl:    Iodine, Kelp, (KELP PO), Take 225 mcg by mouth daily., Disp: , Rfl:    NON FORMULARY, Take 1 capsule by mouth See admin instructions. Gymnema sylvestre capsule- Take 1 capsule by mouth once a day as needed to block sugar, Disp: , Rfl:    OVER THE COUNTER MEDICATION, Take 3 capsules by mouth daily. Juniper Berry, Disp: , Rfl:    OVER THE  COUNTER MEDICATION, Take 5 g by mouth See admin instructions. Beet Powder- Mix 1 heaping teaspoonful into desired beverage and drink once a day, Disp: , Rfl:    OVER THE COUNTER MEDICATION, Take 1 Scoop by mouth daily. Powdered Greens, Disp: , Rfl:    OVER THE COUNTER MEDICATION, Take 4 capsules by mouth See admin instructions. Beta Sistosterol- Take 4 capsules by mouth once a day, Disp: , Rfl:    PAPAYA ENZYME PO, Take 1 capsule by mouth daily as needed (TO AID DIGESTION)., Disp: , Rfl:    Potassium 99 MG TABS, Take 99 mg by mouth daily., Disp: , Rfl:    RESVERATROL PO, Take 1,000 mg by mouth daily., Disp: , Rfl:    Saw Palmetto, Serenoa repens, (SAW PALMETTO PO), Take  540 mg by mouth daily., Disp: , Rfl:    Turmeric 500 MG CAPS, Take 500 mg by mouth daily., Disp: , Rfl:    vitamin E 180 MG (400 UNITS) capsule, Take 400 Units by mouth daily., Disp: , Rfl:    zinc  gluconate 50 MG tablet, Take 50 mg by mouth daily., Disp: , Rfl:   Vitals   Vitals:   May 23, 2023 1040 May 23, 2023 1040 2023/05/23 1230 05/23/23 1545  BP:  117/72 (!) 148/86 (!) 145/76  Pulse:   61 63  Resp:   18 20  Temp:    98.8 F (37.1 C)  TempSrc:      SpO2:   99% 95%  Weight: 75.8 kg 75.8 kg    Height: 5' 10 (1.778 m) 5' 10 (1.778 m)      Body mass index is 23.96 kg/m.  Physical Exam   Constitutional: Appears well-developed and well-nourished.  Psych: Affect appropriate to situation.  Eyes: No scleral injection.  HENT: No OP obstruction.  Head: Normocephalic.  Cardiovascular: Normal rate and regular rhythm.SR on EKG at admission.   Respiratory: Effort normal, non-labored breathing.  GI: Soft.  No distension. There is no tenderness.  Skin: WDI.   Neurologic Examination   Neuro: Mental Status: Patient is awake, alert, oriented to self. Disoriented to place, month, year, age. Follows simple commands.  Patient is unable to give a clear and coherent history. No dysarthria. Intermittent delayed speech with poor  attention/concentration.  Unable to follow complex commands, name multiple objects or repeat phrases.   Cranial Nerves: II: Visual Fields are full. Pupils are equal, round, and reactive to light.   III,IV, VI: EOMI without ptosis or diploplia.  V: Facial sensation is symmetric to light touch VII: Facial movement is symmetric.  VIII: hearing is intact to voice X: Uvula elevates symmetrically XI: Shoulder shrug is symmetric. XII: tongue is midline without atrophy or fasciculations.  Motor: Tone is normal. Bulk is normal. 5/5 strength was present in all four extremities. No drift.  Sensory: Sensation is symmetric to light touch in the arms and legs. Cerebellar: FNF and HKS are intact bilaterally. No overt ataxia seen,    Labs/Imaging/Neurodiagnostic studies   CBC:  Recent Labs  Lab 05/23/2023 1117  WBC 7.8  NEUTROABS 5.4  HGB 14.9  HCT 44.4  MCV 91.5  PLT 281   Basic Metabolic Panel:  Lab Results  Component Value Date   NA 140 May 23, 2023   K 4.0 05-23-23   CO2 24 2023/05/23   GLUCOSE 121 (H) 2023/05/23   BUN 13 2023-05-23   CREATININE 0.73 May 23, 2023   CALCIUM  8.9 05/23/2023   GFRNONAA >60 May 23, 2023   GFRAA 95 02/08/2020   Lipid Panel: No results found for: LDLCALC HgbA1c:  Lab Results  Component Value Date   HGBA1C 5.9 (H) 07/16/2021   Urine Drug Screen: No results found for: LABOPIA, COCAINSCRNUR, LABBENZ, AMPHETMU, THCU, LABBARB  Alcohol  Level No results found for: ETH INR No results found for: INR APTT No results found for: APTT AED levels: No results found for: PHENYTOIN, ZONISAMIDE, LAMOTRIGINE, LEVETIRACETA  CT Head without contrast(Personally reviewed): - No acute intracranial abnormality - Stable advanced atrophy and chronic microvascular ischemic changes  MRI Brain(Personally reviewed): -Small acute or early subacute infarcts, left parietal, right frontal and occipital lobe - Advanced chronic microvascular ischemic  changes and cerebral atrophy   ASSESSMENT   Lemario Chaikin. is a 83 y.o. male with past medical history significant for hypertension, cognitive decline, colon cancer,  neuropathy ? due to chemotherapy, kidney stones presented to the ED for evaluation of worsening confusion.  Patient has a recent history of cognitive decline over the past few years according to wife at bedside, along with walking difficulties for the past 2-3 years.  MRI shows small acute/early subacute infarcts in left parietal and right frontal and occipital lobes. Denies any history of atrial fibrillation, is not on any blood pressure medications or blood thinners.   Discussed plan for stroke work-up, all questions answered at bedside.   RECOMMENDATIONS   - Dementia Panel labs (B12, Thiamine, folate, RPR, Sed Rate) - Frequent Neuro checks per stroke unit protocol - MRI Brain stroke protocol - Vascular imaging - CT Angio Head and Neck - TTE - Lipid panel - Statin - will be started if LDL>70 or otherwise medically indicated - A1C - Antithrombotic - Aspirin  and Plavix  - DVT ppx - ok for lovenox  - SBP goal - <220 for 24-48 hours after symptom onset, then gradually normalize, PRN labetalol  if HR>60 and PRN Hydralazine  if HR<60 - Telemetry monitoring for arrhythmia - 72h - Swallow screen - will be performed prior to PO intake - Stroke education - will be given - PT/OT/SLP - Dispo: admit for stroke work-up  -Outpatient follow-up for possible NPH workup that has been going on for a while.  Unfortunately that is not best done inpatient as it requires pre-LP cognitive testing and walking followed by an LP followed by post LP cognitive testing walking.   Stroke team will follow in AM.   ______________________________________________________________________    Pt seen by Neuro NP/APP and later by MD. Note/plan to be edited by MD as needed.    Rocky JAYSON Likes, DNP, AGACNP-BC [facility] Please use AMION for  contact information & EPIC for messaging.   Attending Neurohospitalist Addendum Patient seen and examined with APP/Resident. Agree with the history and physical as documented above. Agree with the plan as documented, which I helped formulate. I have independently reviewed the chart, obtained history, review of systems and examined the patient.I have personally reviewed pertinent head/neck/spine imaging (CT/MRI). Please feel free to call with any questions.  -- Eligio Lav, MD Neurologist [facility] Pager: [phone]

## 2023-05-15 NOTE — ED Notes (Signed)
 Patient's wife mobile number in chart, please contact mobile number for updates.

## 2023-05-15 NOTE — ED Triage Notes (Addendum)
 EMS reports LKW 2100 but woke up this morning with extra confusion and they report he has an LVO 1 due to unable to identify objects.  Patient has been seeing a MD and working up for confusion.

## 2023-05-15 NOTE — Hospital Course (Signed)
 Jared [name]. is a 83 y.o. male with medical history significant for HTN, cognitive impairment, remote colon cancer with prior right hemicolectomy who is admitted with acute to subacute infarcts of the left parietal and right frontal/occipital lobes.

## 2023-05-16 ENCOUNTER — Observation Stay (HOSPITAL_COMMUNITY): Payer: Medicare PPO

## 2023-05-16 ENCOUNTER — Observation Stay (HOSPITAL_BASED_OUTPATIENT_CLINIC_OR_DEPARTMENT_OTHER): Payer: Medicare PPO

## 2023-05-16 DIAGNOSIS — I639 Cerebral infarction, unspecified: Secondary | ICD-10-CM

## 2023-05-16 DIAGNOSIS — I5030 Unspecified diastolic (congestive) heart failure: Secondary | ICD-10-CM

## 2023-05-16 LAB — BASIC METABOLIC PANEL
Anion gap: 9 (ref 5–15)
BUN: 11 mg/dL (ref 8–23)
CO2: 25 mmol/L (ref 22–32)
Calcium: 9.2 mg/dL (ref 8.9–10.3)
Chloride: 106 mmol/L (ref 98–111)
Creatinine, Ser: 0.72 mg/dL (ref 0.61–1.24)
GFR, Estimated: >60 mL/min (ref >60)
Glucose, Bld: 102 mg/dL — ABNORMAL HIGH (ref 70–99)
Potassium: 3.5 mmol/L (ref 3.5–5.1)
Sodium: 140 mmol/L (ref 135–145)

## 2023-05-16 LAB — ECHOCARDIOGRAM COMPLETE
Area-P 1/2: 2.97 cm2
Height: 70 in
S' Lateral: 3 cm
Weight: 2783.09 [oz_av]

## 2023-05-16 LAB — CBC
HCT: 41.8 % (ref 39.0–52.0)
Hemoglobin: 14.2 g/dL (ref 13.0–17.0)
MCH: 29.9 pg (ref 26.0–34.0)
MCHC: 34 g/dL (ref 30.0–36.0)
MCV: 88 fL (ref 80.0–100.0)
Platelets: 276 10*3/uL (ref 150–400)
RBC: 4.75 MIL/uL (ref 4.22–5.81)
RDW: 12 % (ref 11.5–15.5)
WBC: 5.9 10*3/uL (ref 4.0–10.5)
nRBC: 0 % (ref 0.0–0.2)

## 2023-05-16 LAB — RPR: RPR Ser Ql: NONREACTIVE

## 2023-05-16 LAB — GLUCOSE, CAPILLARY: Glucose-Capillary: 106 mg/dL — ABNORMAL HIGH (ref 70–99)

## 2023-05-16 NOTE — Care Management Obs Status (Signed)
 MEDICARE OBSERVATION STATUS NOTIFICATION   Patient Details  Name: Jared [name]. MRN: [id number] Date of Birth: 24-Feb-1941   Medicare Observation Status Notification Given:  Yes    Gordy Clement, RN 05/16/2023, 12:34 PM

## 2023-05-16 NOTE — Evaluation (Signed)
 Occupational Therapy Evaluation Patient Details Name: Jared [name]. MRN: [id number] DOB: 21-Jan-1941 Today's Date: 05/16/2023   History of Present Illness 83 y.o. male who presented to the ED for evaluation of abnormal speech and increased confusion. MRI brain without contrast showed small acute or early subacute infarcts in the left parietal and right frontal and occipital lobes; anticipate embolic etiology. PMHx: HTN, cognitive impairment, remote colon cancer with prior right hemicolectomy   Clinical Impression   [name] was evaluated s/p the above admission list. He lives with his wife and has a PCA who assists 2 days/wk, he uses an mining engineer scooter to mobilize in the home, furniture walks in the bathrooms, and uses a rollator outside of the home at baseline. Upon evaluation the pt was limited by generalized weakness, baseline cognitive deficits, unsteady/shuffling gait, limited self monitoring of deficits and high fall risk. Overall he needs up to mod A for mobility with RW and significant cues for management of RW and body mechanics, noted difficulty with coordination and steeping of RLE. Due to the deficits listed below the pt also needs up to max A for LB ADLs and set up A for UB ADLs in sitting. Pt will benefit from continued acute OT services and intensive inpatient follow up therapy, >3 hours/day after discharge.         If plan is discharge home, recommend the following: A little help with walking and/or transfers;A lot of help with bathing/dressing/bathroom;Direct supervision/assist for medications management;Direct supervision/assist for financial management;Assist for transportation;Help with stairs or ramp for entrance;Supervision due to cognitive status;Assistance with cooking/housework    Functional Status Assessment  Patient has had a recent decline in their functional status and demonstrates the ability to make significant improvements in function in a reasonable and predictable  amount of time.  Equipment Recommendations  None recommended by OT    Recommendations for Other Services Rehab consult     Precautions / Restrictions Precautions Precautions: Fall Restrictions Weight Bearing Restrictions Per Provider Order: Yes      Mobility Bed Mobility Overal bed mobility: Needs Assistance Bed Mobility: Supine to Sit     Supine to sit: Min assist          Transfers Overall transfer level: Needs assistance Equipment used: Rolling walker (2 wheels) Transfers: Sit to/from Stand Sit to Stand: Min assist           General transfer comment: cues for hand placement      Balance Overall balance assessment: Needs assistance Sitting-balance support: Feet supported Sitting balance-Leahy Scale: Good     Standing balance support: Single extremity supported, During functional activity Standing balance-Leahy Scale: Fair Standing balance comment: statically, dynamically he needs BUE supported for safety and balance                           ADL either performed or assessed with clinical judgement   ADL Overall ADL's : Needs assistance/impaired Eating/Feeding: Independent   Grooming: Contact guard assist;Standing   Upper Body Bathing: Set up;Supervision/ safety;Sitting   Lower Body Bathing: Moderate assistance;Sit to/from stand   Upper Body Dressing : Set up;Sitting   Lower Body Dressing: Moderate assistance;Sit to/from stand   Toilet Transfer: Moderate assistance;Rolling walker (2 wheels);Ambulation;Regular Teacher, Adult Education Details (indicate cue type and reason): mod A for mgmt of RW and problem solving turns in a smaller space Toileting- Clothing Manipulation and Hygiene: Contact guard assist;Sitting/lateral lean;Sit to/from stand       Functional  mobility during ADLs: Moderate assistance;Rolling walker (2 wheels) General ADL Comments: limited by generalized weakness, unsteady balance, impaired cognition     Vision  Baseline Vision/History: 0 No visual deficits Vision Assessment?: No apparent visual deficits     Perception Perception: Not tested       Praxis Praxis: Not tested       Pertinent Vitals/Pain Pain Assessment Pain Assessment: No/denies pain     Extremity/Trunk Assessment Upper Extremity Assessment Upper Extremity Assessment: Generalized weakness   Lower Extremity Assessment Lower Extremity Assessment: Defer to PT evaluation   Cervical / Trunk Assessment Cervical / Trunk Assessment: Normal   Communication Communication Communication: No apparent difficulties   Cognition Arousal: Alert Behavior During Therapy: WFL for tasks assessed/performed Overall Cognitive Status: History of cognitive impairments - at baseline                   General Comments  elevated BP initially, dropped without symptoms, recovered with activity            Home Living Family/patient expects to be discharged to:: Private residence Living Arrangements: Spouse/significant other Available Help at Discharge: Family;Available 24 hours/day Type of Home: House Home Access: Stairs to enter [employer] of Steps: 4 Entrance Stairs-Rails: Left Home Layout: One level;Multi-level;Able to live on main level with bedroom/bathroom     Bathroom Shower/Tub: Producer, Television/film/video: Handicapped height Bathroom Accessibility: Yes   Home Equipment: Agricultural Consultant (2 wheels);Rollator (4 wheels);Grab bars - tub/shower;Hand held shower head;Shower seat - built in;Electric scooter          Prior Functioning/Environment Prior Level of Function : Independent/Modified Independent             Mobility Comments: uses art gallery manager for mobility in home, furniture surfs in bathroom, and when not using scooter. Rollator to get to car, wife drives. no falls in several years. ADLs Comments: bathes himself, with setup, HHAide 2x week, dresses himself        OT Problem List:  Decreased strength;Decreased range of motion;Decreased activity tolerance;Impaired balance (sitting and/or standing);Decreased safety awareness;Decreased knowledge of precautions;Decreased knowledge of use of DME or AE;Pain      OT Treatment/Interventions: Self-care/ADL training;Therapeutic exercise;DME and/or AE instruction;Therapeutic activities;Patient/family education;Balance training    OT Goals(Current goals can be found in the care plan section) Acute Rehab OT Goals Patient Stated Goal: to get better OT Goal Formulation: With patient Time For Goal Achievement: 05/30/23 Potential to Achieve Goals: Good ADL Goals Pt Will Perform Grooming: with modified independence;standing Pt Will Perform Lower Body Dressing: with modified independence;sit to/from stand Pt Will Transfer to Toilet: with modified independence;ambulating Additional ADL Goal #1: Pt will navigate hospital environment with RW and mod I to demonstrate reduced risk for falls at discharge.  OT Frequency: Min 1X/week       AM-PAC OT 6 Clicks Daily Activity     Outcome Measure Help from another person eating meals?: None Help from another person taking care of personal grooming?: A Little Help from another person toileting, which includes using toliet, bedpan, or urinal?: A Lot Help from another person bathing (including washing, rinsing, drying)?: A Lot Help from another person to put on and taking off regular upper body clothing?: A Little Help from another person to put on and taking off regular lower body clothing?: A Lot 6 Click Score: 16   End of Session Equipment Utilized During Treatment: Rolling walker (2 wheels);Gait belt Nurse Communication: Mobility status  Activity Tolerance: Patient tolerated treatment  well Patient left: in chair;with call bell/phone within reach;with chair alarm set  OT Visit Diagnosis: Unsteadiness on feet (R26.81);Other abnormalities of gait and mobility (R26.89);Muscle weakness  (generalized) (M62.81)                Time: 9149-9071 OT Time Calculation (min): 38 min Charges:  OT General Charges $OT Visit: 1 Visit OT Evaluation $OT Eval Moderate Complexity: 1 Mod  Lucie Kendall, OTR/L Acute Rehabilitation Services Office [phone] Secure Chat Communication Preferred   Lucie JONETTA Kendall 05/16/2023, 10:30 AM

## 2023-05-16 NOTE — Evaluation (Signed)
 Speech Language Pathology Evaluation Patient Details Name: Jared [name]. 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/16/2021   Chemotherapy-induced neuropathy (HCC) 02/08/2020   Neuropathy 02/08/2020   At high risk for injury related to fall 02/08/2020   Past Medical History:  Past Medical History:  Diagnosis Date   colon cancer    History of kidney stones    Hypertension    Patient denies   Neuropathy due to chemotherapeutic drug [facility])    Neurologist says doesnt have it   Pre-diabetes    Past Surgical History:  Past Surgical History:  Procedure Laterality Date   CATARACT EXTRACTION, BILATERAL     COLON SURGERY  2004   R hemicolectomy - removed due to cancer   COLONOSCOPY     HEMICOLECTOMY     HERNIA REPAIR  2005   REVERSE SHOULDER ARTHROPLASTY Left 11/29/2020   Procedure: REVERSE SHOULDER ARTHROPLASTY;  Surgeon: Cristy Bonner DASEN, MD;  Location: [facility];  Service: Orthopedics;  Laterality: Left;   HPI:  83 y.o. male who presented to the ED for evaluation of abnormal speech and increased confusion. MRI brain without contrast showed small acute or early subacute infarcts in the left parietal and right frontal and occipital lobes; anticipate embolic etiology. PMHx: HTN, cognitive impairment, remote colon cancer with prior right hemicolectomy   Assessment / Plan / Recommendation Clinical Impression  Pt seen for speech-language-cognitive assessment. Pt's wife and son report noticing cognitive decline a year ago that has progressed and is exacerbated with stroke. He only takes supplements and wife is responsible for finances. His  oromotor abilities are within normal range with intelligible speech. Memory impairments noted as he did not recall therapist telling him he had a stroke at begining of assessment. He is aware of physical deficits but lacks insight into his cognitive impairments and will laugh frequently when he doesn't know an answer. On the SLUMS he scored a 15/30. In addition he exhibited difficulty with clock drawing. SLP wrote reason for admission on white board for him to refer to. Pt would benefit from treatment on acute and on inpatient rehab. He does not have much that he is responsible for at home but feel he may benefit from compensatory strategies and further education for family.    SLP Assessment  SLP Recommendation/Assessment: Patient needs continued Speech Lanaguage Pathology Services SLP Visit Diagnosis: Cognitive communication deficit (R41.841)    Recommendations for follow up therapy are one component of a multi-disciplinary discharge planning process, led by the attending physician.  Recommendations may be updated based on patient status, additional functional criteria and insurance authorization.    Follow Up Recommendations  Acute inpatient rehab (3hours/day)    Assistance Recommended at Discharge     Functional Status Assessment Patient has had a recent decline in their functional status and demonstrates the ability to make significant improvements in function in a reasonable and predictable amount of time.  Frequency and Duration min 2x/week  2 weeks      SLP Evaluation Cognition  Overall Cognitive Status: Impaired/Different from baseline (does have history of cognitive impairments but family states worse now) Arousal/Alertness: Awake/alert Orientation Level: Disoriented  to situation;Oriented to person;Oriented to place;Oriented to time Year: 2025 Day of Week: Correct Attention: Sustained Sustained Attention: Appears intact Memory: Impaired Memory Impairment: Retrieval deficit (3/5  word recall) Awareness: Impaired Awareness Impairment: Intellectual impairment;Emergent impairment;Anticipatory impairment Problem Solving: Impaired Problem Solving Impairment: Functional basic Safety/Judgment: Impaired       Comprehension  Auditory Comprehension Overall Auditory Comprehension: Appears within functional limits for tasks assessed Visual Recognition/Discrimination Discrimination: Not tested Reading Comprehension Reading Status: Not tested    Expression Expression Primary Mode of Expression: Verbal Verbal Expression Overall Verbal Expression: Appears within functional limits for tasks assessed Initiation: No impairment Level of Generative/Spontaneous Verbalization: Conversation Repetition:  (NT) Naming: Impairment Divergent:  (named 5 animals in one minute) Pragmatics: No impairment Written Expression Dominant Hand: Right Written Expression: Not tested   Oral / Motor  Oral Motor/Sensory Function Overall Oral Motor/Sensory Function: Within functional limits Motor Speech Overall Motor Speech: Appears within functional limits for tasks assessed Respiration: Within functional limits Phonation: Normal Resonance: Within functional limits Articulation: Within functional limitis Intelligibility: Intelligible Motor Planning: Witnin functional limits Motor Speech Errors: Not applicable            Dustin Olam Bull 05/16/2023, 3:25 PM

## 2023-05-16 NOTE — Progress Notes (Signed)
 Passed Swallow evaluation. Notified Dr. Delma Post. See new diet order.

## 2023-05-16 NOTE — Progress Notes (Signed)
   05/16/23 1100  SLP Visit Information  SLP Received On 05/16/23  Reason Eval/Treat Not Completed Other (comment);Patient at procedure or test/unavailable (pt having vascular study at this time; will continue efforts)   Madelin POUR, MS [place] [facility] SLP Acute Rehab Services Office [phone]

## 2023-05-16 NOTE — Evaluation (Signed)
 Physical Therapy Evaluation Patient Details Name: Jared [name]. MRN: [id number] DOB: 02-26-1941 Today's Date: 05/16/2023  History of Present Illness  83 y.o. male who presented to the ED for evaluation of abnormal speech and increased confusion. MRI brain without contrast showed small acute or early subacute infarcts in the left parietal and right frontal and occipital lobes; anticipate embolic etiology. PMHx: HTN, cognitive impairment, remote colon cancer with prior right hemicolectomy  Clinical Impression  Pt is likely not the best historian as he loses train of thought easily and needs redirecting for clarification of facts. PTA pt living with wife in 3 story home with bed and bath on the first level and gym in the basement with 4 steps to enter. Pt reports that he has a male HHAide who comes 2x/wk for 2 hours, pt reports he takes the pt down to the gym but also that helps him get dressed. Pt reports he uses his scooter almost all the time in his home but that it does not fit in his bathroom so he furniture surfs in the bathroom. PPt is limited in safe mobility by cognitive deficits, poor coordination which results in decreased safety with gait placing him at high fall risk. Pt has good strength and ROM in his R LE, with marching in place he can nearly get to 90 degree hip flexion in marching in place but with walking requires constant cuing for picking up his feet requiring modA for steadying to keep from falling. Pt also looses focus in mid activity, forgetting that he was up to go to the bathroom when standing at the door. Patient will benefit from intensive inpatient follow up therapy, >3 hours/day. PT will continue to follow acutely.        If plan is discharge home, recommend the following: A lot of help with walking and/or transfers;A little help with bathing/dressing/bathroom;Assistance with cooking/housework;Direct supervision/assist for medications management;Direct supervision/assist  for financial management;Assist for transportation;Help with stairs or ramp for entrance;Supervision due to cognitive status   Can travel by private vehicle    Yes    Equipment Recommendations None recommended by PT  Recommendations for Other Services  Rehab consult    Functional Status Assessment Patient has had a recent decline in their functional status and demonstrates the ability to make significant improvements in function in a reasonable and predictable amount of time.     Precautions / Restrictions Precautions Precautions: Fall Restrictions Weight Bearing Restrictions Per Provider Order: Yes      Mobility  Bed Mobility Overal bed mobility: Needs Assistance Bed Mobility: Supine to Sit     Supine to sit: Min assist          Transfers Overall transfer level: Needs assistance Equipment used: Rolling walker (2 wheels) Transfers: Sit to/from Stand Sit to Stand: Min assist           General transfer comment: cues for hand placement    Ambulation/Gait Ambulation/Gait assistance: Min assist, +2 physical assistance Gait Distance (Feet): 20 Feet Assistive device: Rolling walker (2 wheels) Gait Pattern/deviations: Step-to pattern, Decreased stance time - right, Decreased step length - right, Decreased dorsiflexion - right, Decreased weight shift to right Gait velocity: variable Gait velocity interpretation: <1.8 ft/sec, indicate of risk for recurrent falls Pre-gait activities: marching in place General Gait Details: minAx2 for ambulation due to decreased coordination, pt able to show large amplitude hip and knee flexion with marching in place however with ambulation pt requires constant cuing for picking up his R  foot and moving it forward, pt has pauses in his ambulation as though he forgets where he is going and needs cuing to continue.     Modified Rankin (Stroke Patients Only) Modified Rankin (Stroke Patients Only) Pre-Morbid Rankin Score: Moderately severe  disability Modified Rankin: Moderately severe disability     Balance Overall balance assessment: Needs assistance Sitting-balance support: Feet supported Sitting balance-Leahy Scale: Good     Standing balance support: Single extremity supported, During functional activity Standing balance-Leahy Scale: Fair Standing balance comment: statically, dynamically he needs BUE supported for safety and balance                             Pertinent Vitals/Pain Pain Assessment Pain Assessment: No/denies pain    Home Living Family/patient expects to be discharged to:: Private residence Living Arrangements: Spouse/significant other Available Help at Discharge: Family;Available 24 hours/day Type of Home: House Home Access: Stairs to enter Entrance Stairs-Rails: Left Entrance Stairs-Number of Steps: 4   Home Layout: One level;Multi-level;Able to live on main level with bedroom/bathroom Home Equipment: Rolling Walker (2 wheels);Rollator (4 wheels);Grab bars - tub/shower;Hand held shower head;Shower seat - built in;Electric scooter      Prior Function Prior Level of Function : Independent/Modified Independent             Mobility Comments: uses art gallery manager for mobility in home, furniture surfs in bathroom, and when not using scooter. Rollator to get to car, wife drives. no falls in several years. ADLs Comments: bathes himself, with setup, HHAide 2x week, dresses himself     Extremity/Trunk Assessment   Upper Extremity Assessment Upper Extremity Assessment: Generalized weakness    Lower Extremity Assessment Lower Extremity Assessment: RLE deficits/detail;LLE deficits/detail RLE Deficits / Details: AROM WFL, strength grossly 3+/5 RLE Sensation: decreased light touch RLE Coordination: decreased fine motor;decreased gross motor LLE Deficits / Details: AROM WFL, strength grossly 4/5 LLE Sensation: decreased light touch LLE Coordination: decreased fine motor     Cervical / Trunk Assessment Cervical / Trunk Assessment: Normal  Communication   Communication Communication: No apparent difficulties  Cognition Arousal: Alert Behavior During Therapy: WFL for tasks assessed/performed Overall Cognitive Status: History of cognitive impairments - at baseline                                          General Comments General comments (skin integrity, edema, etc.): elevated BP initially, dropped without symptoms, recovered with activity        Assessment/Plan    PT Assessment Patient needs continued PT services  PT Problem List Decreased balance;Decreased mobility;Decreased coordination;Decreased cognition;Decreased safety awareness;Decreased knowledge of use of DME;Impaired sensation       PT Treatment Interventions DME instruction;Gait training;Stair training;Functional mobility training;Therapeutic activities;Therapeutic exercise;Balance training;Neuromuscular re-education;Patient/family education;Cognitive remediation    PT Goals (Current goals can be found in the Care Plan section)  Acute Rehab PT Goals PT Goal Formulation: With patient Time For Goal Achievement: 05/30/23 Potential to Achieve Goals: Fair    Frequency Min 1X/week     Co-evaluation   Reason for Co-Treatment: Complexity of the patient's impairments (multi-system involvement);For patient/therapist safety;To address functional/ADL transfers   OT goals addressed during session: ADL's and self-care       AM-PAC PT 6 Clicks Mobility  Outcome Measure Help needed turning from your back to your side while in a flat bed  without using bedrails?: A Little Help needed moving from lying on your back to sitting on the side of a flat bed without using bedrails?: A Little Help needed moving to and from a bed to a chair (including a wheelchair)?: A Little Help needed standing up from a chair using your arms (e.g., wheelchair or bedside chair)?: A Little Help  needed to walk in hospital room?: A Little Help needed climbing 3-5 steps with a railing? : A Lot 6 Click Score: 17    End of Session Equipment Utilized During Treatment: Gait belt Activity Tolerance: Patient tolerated treatment well Patient left: in chair;with call bell/phone within reach;with chair alarm set Nurse Communication: Mobility status PT Visit Diagnosis: Unsteadiness on feet (R26.81);Other abnormalities of gait and mobility (R26.89);Repeated falls (R29.6);Muscle weakness (generalized) (M62.81);Difficulty in walking, not elsewhere classified (R26.2);Hemiplegia and hemiparesis Hemiplegia - Right/Left: Right Hemiplegia - caused by: Cerebral infarction    Time: 9146-9071 PT Time Calculation (min) (ACUTE ONLY): 35 min   Charges:   PT Evaluation $PT Eval Moderate Complexity: 1 Mod   PT General Charges $$ ACUTE PT VISIT: 1 Visit         Jared [name] PT, DPT Acute Rehabilitation Services Please use secure chat or  Call Office [phone]   Jared [name] [facility] 05/16/2023, 11:46 AM

## 2023-05-16 NOTE — Progress Notes (Addendum)
 STROKE TEAM PROGRESS NOTE   INTERIM HISTORY/SUBJECTIVE Seen in room. States he does remember feeling confused but better today. Able to stand at the bedside with assist of 2. No ataxia. Loop recorder requested.  Additionally patient is a candidate is a CIR candidate.   OBJECTIVE  CBC    Component Value Date/Time   WBC 5.9 05/16/2023 0658   RBC 4.75 05/16/2023 0658   HGB 14.2 05/16/2023 0658   HGB 13.8 07/16/2021 1519   HGB 14.8 11/19/2007 1307   HCT 41.8 05/16/2023 0658   HCT 40.4 07/16/2021 1519   HCT 42.7 11/19/2007 1307   PLT 276 05/16/2023 0658   PLT 285 07/16/2021 1519   MCV 88.0 05/16/2023 0658   MCV 91 07/16/2021 1519   MCV 88.8 11/19/2007 1307   MCH 29.9 05/16/2023 0658   MCHC 34.0 05/16/2023 0658   RDW 12.0 05/16/2023 0658   RDW 11.8 07/16/2021 1519   RDW 12.1 11/19/2007 1307   LYMPHSABS 1.3 05/15/2023 1117   LYMPHSABS 2.0 07/16/2021 1519   LYMPHSABS 2.1 11/19/2007 1307   MONOABS 0.8 05/15/2023 1117   MONOABS 0.7 11/19/2007 1307   EOSABS 0.2 05/15/2023 1117   EOSABS 0.2 07/16/2021 1519   BASOSABS 0.0 05/15/2023 1117   BASOSABS 0.0 07/16/2021 1519   BASOSABS 0.0 11/19/2007 1307    BMET    Component Value Date/Time   NA 140 05/16/2023 0658   NA 141 07/16/2021 1519   K 3.5 05/16/2023 0658   CL 106 05/16/2023 0658   CO2 25 05/16/2023 0658   GLUCOSE 102 (H) 05/16/2023 0658   BUN 11 05/16/2023 0658   BUN 20 07/16/2021 1519   CREATININE 0.72 05/16/2023 0658   CALCIUM  9.2 05/16/2023 0658   EGFR 92 07/16/2021 1519   GFRNONAA >60 05/16/2023 0658    IMAGING past 24 hours CT ANGIO HEAD NECK W WO CM Result Date: 05/15/2023 CLINICAL DATA:  Left parietal, right frontal, and right occipital infarcts on same-day MRI, determine embolic source EXAM: CT ANGIOGRAPHY HEAD AND NECK WITH AND WITHOUT CONTRAST TECHNIQUE: Multidetector CT imaging of the head and neck was performed using the standard protocol during bolus administration of intravenous contrast. Multiplanar CT  image reconstructions and MIPs were obtained to evaluate the vascular anatomy. Carotid stenosis measurements (when applicable) are obtained utilizing NASCET criteria, using the distal internal carotid diameter as the denominator. RADIATION DOSE REDUCTION: This exam was performed according to the departmental dose-optimization program which includes automated exposure control, adjustment of the mA and/or kV according to patient size and/or use of iterative reconstruction technique. CONTRAST:  75mL OMNIPAQUE  IOHEXOL  350 MG/ML SOLN COMPARISON:  05/15/2023 CT head, 05/15/2023 MRI head, no prior CTA available FINDINGS: CT HEAD FINDINGS For noncontrast findings, please see same day CT head. CTA NECK FINDINGS Aortic arch: Variant anatomy, with a common origin of the brachiocephalic and left common carotid artery, and the left vertebral artery originating from the aorta. Imaged portion shows no evidence of aneurysm or dissection. Severe stenosis at the origin of the left vertebral artery. Right carotid system: No evidence of dissection, occlusion, or hemodynamically significant stenosis (greater than 50%). Atherosclerotic disease at the bifurcation and in the proximal ICA is not hemodynamically significant. Left carotid system: Approximately 60% stenosis in the distal left CCA, secondary to noncalcified plaque primarily (series 5, image 215 and series 8, image 118), with an area of focal plaque ulceration (series 5, image 216). No hemodynamically significant stenosis in the left ICA or ECA. No evidence of dissection. Vertebral arteries:  Severe stenosis at the takeoff of the left vertebral artery from the aorta. The left vertebral artery is otherwise patent to the skull base, without significant stenosis. Right dominant system. The right vertebral artery is patent from its origin to the skull base, without significant stenosis. No evidence of dissection. Skeleton: No acute osseous abnormality. Degenerative changes in the  cervical spine. Other neck: No acute finding. Upper chest: No focal pulmonary opacity or pleural effusion. Review of the MIP images confirms the above findings CTA HEAD FINDINGS Anterior circulation: Both internal carotid arteries are patent to the termini, with calcifications but without significant stenosis. A1 segments patent. Normal anterior communicating artery. Anterior cerebral arteries are patent to their distal aspects without significant stenosis. No M1 stenosis or occlusion. MCA branches perfused to their distal aspects without significant stenosis. Posterior circulation: Vertebral arteries patent to the vertebrobasilar junction without significant stenosis. Posterior inferior cerebellar arteries patent proximally. Basilar patent to its distal aspect without significant stenosis. Superior cerebellar arteries patent proximally. Patent P1 segments. PCAs perfused to their distal aspects without significant stenosis. The bilateral posterior communicating arteries are not visualized. Venous sinuses: As permitted by contrast timing, patent. Anatomic variants: None significant. No evidence of aneurysm or vascular malformation. Review of the MIP images confirms the above findings IMPRESSION: 1. No intracranial large vessel occlusion or significant stenosis. 2. Approximately 60% stenosis in the distal left CCA, secondary to noncalcified plaque, with an area of focal plaque ulceration. 3. Severe stenosis at the takeoff of the left vertebral artery from the aorta. Electronically Signed   By: Donald Campion M.D.   On: 05/15/2023 17:51   MR BRAIN WO CONTRAST Result Date: 05/15/2023 CLINICAL DATA:  Mental status change, unknown cause EXAM: MRI HEAD WITHOUT CONTRAST TECHNIQUE: Multiplanar, multiecho pulse sequences of the brain and surrounding structures were obtained without intravenous contrast. COMPARISON:  Same day CT head. FINDINGS: Brain: Small acute or early subacute infarcts in the left parietal and right  frontal and occipital lobes. No substantial mass effect. Advanced T2/FLAIR hyperintensities in the white matter compatible with chronic microvascular ischemic change. Cerebral atrophy. No evidence of acute hemorrhage, mass lesion, midline shift or hydrocephalus. Vascular: Major arterial flow voids are maintained at the skull base. Skull and upper cervical spine: Normal marrow signal. Sinuses/Orbits: Mild-to-moderate paranasal sinus mucosal thickening. No acute orbital findings. Other: Small right mastoid effusion. IMPRESSION: 1. Small acute or early subacute infarcts in the left parietal and right frontal and occipital lobes. Given involvement of multiple vascular territories, consider an embolic etiology. 2. Advanced chronic microvascular ischemic change and cerebral atrophy (ICD10-G31.9). Electronically Signed   By: Gilmore GORMAN Molt M.D.   On: 05/15/2023 15:25   CT Head Wo Contrast Result Date: 05/15/2023 CLINICAL DATA:  Altered mental status. EXAM: CT HEAD WITHOUT CONTRAST TECHNIQUE: Contiguous axial images were obtained from the base of the skull through the vertex without intravenous contrast. RADIATION DOSE REDUCTION: This exam was performed according to the departmental dose-optimization program which includes automated exposure control, adjustment of the mA and/or kV according to patient size and/or use of iterative reconstruction technique. COMPARISON:  MRI brain dated March 08, 2021. FINDINGS: Brain: No evidence of acute infarction, hemorrhage, hydrocephalus, extra-axial collection or mass lesion/mass effect. Stable advanced atrophy and chronic microvascular ischemic changes. Vascular: Calcified atherosclerosis at the skull base. No hyperdense vessel. Skull: Normal. Negative for fracture or focal lesion. Sinuses/Orbits: No acute finding. Other: None. IMPRESSION: 1. No acute intracranial abnormality. 2. Stable advanced atrophy and chronic microvascular ischemic changes. Electronically Signed  By:  Elsie ONEIDA Shoulder M.D.   On: 05/15/2023 11:43   DG Chest Port 1 View Result Date: 05/15/2023 CLINICAL DATA:  Altered mental status. EXAM: PORTABLE CHEST 1 VIEW COMPARISON:  None Available. FINDINGS: The heart size and mediastinal contours are within normal limits. Status post left total shoulder arthroplasty. Right lung is clear. Mild left basilar atelectasis or infiltrate is noted with small left pleural effusion. IMPRESSION: Mild left basilar subsegmental atelectasis or infiltrate is noted with small left pleural effusion. Followup PA and lateral chest X-ray is recommended in 3-4 weeks following trial of antibiotic therapy to ensure resolution and exclude underlying malignancy. Electronically Signed   By: Lynwood Landy Raddle M.D.   On: 05/15/2023 11:30    Vitals:   05/15/23 2357 05/16/23 0000 05/16/23 0435 05/16/23 0700  BP: (!) 127/108  126/86 139/84  Pulse: 62  63 66  Resp: 15  16 14   Temp: 97.9 F (36.6 C)  98.3 F (36.8 C) 98.5 F (36.9 C)  TempSrc: Oral  Oral Oral  SpO2: 94%  94% 97%  Weight:  78.9 kg    Height:  5' 10 (1.778 m)       PHYSICAL EXAM General:  Alert, well-nourished, well-developed patient in no acute distress Psych:  Mood and affect appropriate for situation CV: Regular rate and rhythm on monitor Respiratory:  Regular, unlabored respirations on room air GI: Abdomen soft and nontender   Neuro: Mental Status: Patient is awake, alert, oriented to self. Disoriented to place, month, year, age. Follows simple commands.  Patient is unable to give a clear and coherent history. No dysarthria. Intermittent delayed speech with poor attention/concentration.  Unable to follow complex commands, name multiple objects or repeat phrases.    Cranial Nerves: II: Visual Fields are full. Pupils are equal, round, and reactive to light.   III,IV, VI: EOMI without ptosis or diploplia.  V: Facial sensation is symmetric to light touch VII: Facial movement is symmetric.  VIII: hearing  is intact to voice X: Uvula elevates symmetrically XI: Shoulder shrug is symmetric. XII: tongue is midline without atrophy or fasciculations.  Motor: Tone is normal. Bulk is normal. 5/5 strength was present in all four extremities. No drift.  Sensory: Sensation is symmetric to light touch in the arms and legs. Cerebellar: FNF and HKS are intact bilaterally. No overt ataxia seen Gait: unsteady, needs assist of two to stand  Most Recent NIH 0    ASSESSMENT/PLAN  Acute Ischemic Infarct:  Small acute/early subacute infarcts in the right occipital and frontal, as well as left parietal lobes Etiology:  likely cardioembolic  Code Stroke CT head No acute abnormality. Small vessel disease.  CTA head & neck Approximately 60% stenosis in the distal left CCA, secondary to noncalcified plaque, with an area of focal plaque ulceration. Severe stenosis at the takeoff of the left vertebral artery from the aorta. MRI  small acute/early subacute infarcts in left parietal and right frontal and occipital lobes  2D Echo EF 70-75% LE venous Doppler no DVT Recommend loop recorder before discharge to rule out A-fib - cardiology aware and will do prior to discharge LDL 124 HgbA1c 6.0 VTE prophylaxis - Lovenox  No antithrombotic prior to admission, now on aspirin  81 mg daily and clopidogrel  75 mg daily for 3 weeks and then aspirin  81mg  alone. Therapy recommendations:  CIR Disposition:   Pending  Previous NPH work up  Cognitive Impairment 2022-2023 - no NPH found per Dr. Chalice Continue follow-up with Dr Chalice and [facility] neurologist  as outpatient B12, TSH, ESR, RPR normal Gait dysfunction - uses a wheelchair/scooter at baseline  PT/OT consulted  Left CCA stenosis CT head and neck 60% stenosis in the distal left CCA secondary to plaque with ulceration Outpatient follow-up with vascular surgery  Hypertension Stable Orthostatic vital no significant orthostatic hypotension, lying 158/89, sitting  139/93, standing 126/97, standing 3 minutes 135/92. 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 MD to update note as needed.   Jorene Last, DNP, FNP-BC [facility] Pager: [phone]  ATTENDING NOTE: I reviewed above note and agree with the assessment and plan. Pt was seen and examined.   Wife at bedside.  Patient sitting in bed, having lunch, eating well.  Awake, alert, orientated x 3, psychomotor slowing, however able to name and repeat, follows some commands.  Neuro examination no focal deficit.  MRI showed bilateral MCA/PCA and right MCA/ACA infarcts.  No significant orthostatic hypotension.  CT head and neck showed distal left CCA 60% stenosis, left VA origin severe stenosis.  No DVT.  EF normal range.  LDL 124, A1c 6.0.  Patient stroke concerning for cardioembolic source, recommend loop recorder prior to discharge.  Currently on DAPT for 3 weeks and then aspirin  alone.  Put on statin.  PT and OT recommends CR.  For detailed assessment and plan, please refer to above/below as I have made changes wherever appropriate.   Neurology will sign off. Please call with questions. Pt will follow up with Dr. Chalice at [facility] in about 4 weeks. Thanks for the consult.   Ary Cummins, MD PhD Stroke Neurology 05/16/2023 3:21 PM    To contact Stroke Continuity provider, please refer to [email or web address]. After hours, contact General Neurology

## 2023-05-16 NOTE — Plan of Care (Signed)
  Problem: Education: Goal: Knowledge of disease or condition will improve Outcome: Progressing   Problem: Ischemic Stroke/TIA Tissue Perfusion: Goal: Complications of ischemic stroke/TIA will be minimized 05/16/2023 0545 by Talbert Harlene BIRCH, RN Outcome: Progressing 05/16/2023 0415 by Talbert Harlene BIRCH, RN Outcome: Progressing   Problem: Health Behavior/Discharge Planning: Goal: Ability to manage health-related needs will improve Outcome: Progressing   Problem: Self-Care: Goal: Ability to communicate needs accurately will improve Outcome: Progressing

## 2023-05-16 NOTE — Plan of Care (Signed)
  Problem: Ischemic Stroke/TIA Tissue Perfusion: Goal: Complications of ischemic stroke/TIA will be minimized Outcome: Progressing   Problem: Self-Care: Goal: Ability to communicate needs accurately will improve Outcome: Progressing   Problem: Nutrition: Goal: Risk of aspiration will decrease Outcome: Progressing Goal: Dietary intake will improve Outcome: Progressing   Problem: Nutrition: Goal: Dietary intake will improve Outcome: Progressing

## 2023-05-16 NOTE — Consult Note (Signed)
 ELECTROPHYSIOLOGY CONSULT NOTE  Patient ID: Jared [name]. MRN: [id number], DOB/AGE: [birth year]   Admit date: 05/15/2023 Date of Consult: 05/16/2023  Primary Physician: Yolande Toribio MATSU, MD Primary Cardiologist: None  Primary Electrophysiologist: New to None  Reason for Consultation: Cryptogenic stroke; recommendations regarding Implantable Loop Recorder Insurance: [employer]  History of Present Illness:  83 y/o M with PMH of HTN, cognitive decline, colon cancer, possible chemo related neuropathy, renal calculi and pre-diabetes.    EP has been asked to evaluate Jared [name]. for placement of an implantable loop recorder to monitor for atrial fibrillation by Dr Jerri.  The patient was admitted on 05/15/2023 with confusion, non-sensical speech and increased global weakness.    Imaging demonstrated:  MRI shows small acute/early subacute infarcts in left parietal and right frontal and occipital lobes.   He has undergone workup for stroke including:  ECHO 05/16/23 > LVEF 70-75%, G1DD, RV systolic function normal, normal mitral valve, AV tricuspid, moderate calcification of AV, AV sclerosis / calcification present w/o stenosis, aortic dilatation noted, borderline dilatation of the aortic root measuring 38 mm Hgb A1c 6  CT Head 05/15/23 > no acute abnormality  CTA Head/Neck 05/15/2023 > no LVO or significant stenosis, approx 60% distal L CCA stenosis due to noncalcified plaque with area of focal plaque ulceration, severe stenosis at the takeoff of the left vertebral artery from the aorta  LE US  05/16/23 > negative for DVT in LE's  PT/OT evaluation  Lipid Panel  SLP Evaluation    The patient has been monitored on telemetry which has demonstrated sinus rhythm with no arrhythmias.  Inpatient stroke work-up will not require a TEE per Neurology.   Echocardiogram as above. Lab work is reviewed.  Prior to admission, the patient denies chest pain, shortness of breath, dizziness, palpitations,  or syncope.  He is recovering from his stroke with plans to go to rehab. He has no known history of atrial fibrillation.  Allergies, Past Medical, Surgical, Social, and Family Histories have been reviewed and are referenced here-in when relevant for medical decision making.   Inpatient Medications:   aspirin  EC  81 mg Oral Daily   clopidogrel   75 mg Oral Daily   enoxaparin  (LOVENOX ) injection  40 mg Subcutaneous Q24H   rosuvastatin   20 mg Oral Daily    Physical Exam: Vitals:   05/16/23 0000 05/16/23 0435 05/16/23 0700 05/16/23 1158  BP:  126/86 139/84 115/72  Pulse:  63 66 75  Resp:  16 14 14   Temp:  98.3 F (36.8 C) 98.5 F (36.9 C) 98.3 F (36.8 C)  TempSrc:  Oral Oral Oral  SpO2:  94% 97% 98%  Weight: 78.9 kg     Height: 5' 10 (1.778 m)       GEN- NAD. A&O x 3. Normal affect. HEENT: Normocephalic, atraumatic Lungs- CTAB, Normal effort.  Heart- Normal rate and regular rhythm.  Extremities- No peripheral edema. no clubbing or cyanosis Skin- warm and dry, no rash or lesion.    12-lead ECG 05/15/2023 > SR 70 bpm (personally reviewed) All prior EKG's in EPIC reviewed with no documented atrial fibrillation  Telemetry SB mid 50's to 60's, occ PVC, no AF (personally reviewed)  Assessment and Plan: 83 y/o M with PMH of HTN, cognitive decline, colon cancer, possible chemo related neuropathy, renal calculi and pre-diabetes who is admitted with acute stroke.   1. Cryptogenic Stroke The patient presents with cryptogenic stroke.  The patient does not have a TEE  planned for this AM.  I spoke at length with the patient about monitoring for afib with an implantable loop recorder.  Risks, benefits, and alteratives to implantable loop recorder were discussed with the patient today.   At this time, patient would like to think about his options. We will readdress tomorrow morning and if he wants to proceed with loop implant it will be done tomorrow afternoon.   Signed,  Fonda Kitty,  MD

## 2023-05-16 NOTE — Progress Notes (Signed)
  Inpatient Rehab Admissions Coordinator :  Per therapy recommendations, patient was screened for CIR candidacy by Ottie Glazier RN MSN.  At this time patient appears to be a potential candidate for CIR. I will place a rehab consult per protocol for full assessment. Please call me with any questions.  Ottie Glazier RN MSN Admissions Coordinator [phone]

## 2023-05-16 NOTE — Progress Notes (Addendum)
 PROGRESS NOTE   Jared [name].  FMW:[id number]    DOB: 12/25/1940    DOA: 05/15/2023  PCP: Yolande Toribio MATSU, MD   I have briefly reviewed patients previous medical records in [facility].  Chief Complaint  Patient presents with   Altered Mental Status    Brief Hospital Course:  83 y.o. married male, chronic ambulatory dysfunction, uses a motorized scooter to transport in the house, with medical history significant for HTN, cognitive impairment, remote colon cancer with prior right hemicolectomy who presented to the ED for evaluation of abnormal speech (garbled speech and difficulty saying what he wanted to say) and increased confusion from baseline.  LKW 2100 on 05/14/2023.  Admitted for acute embolic strokes.  Neurology consulted.   Assessment & Plan:  Principal Problem:   Acute CVA (cerebrovascular accident) [facility]) Active Problems:   Hypertension   Acute ischemic stroke Suspect embolic etiology given bilaterality of stroke involvement. CT head without contrast 1/9: No acute intracranial abnormality.  Stable advanced atrophy and chronic microvascular ischemic changes MRI brain: Small acute or early subacute infarcts in the left parietal and right frontal and occipital lobes.  Advanced chronic microvascular ischemic change and cerebral atrophy. CTA head and neck: No intracranial LVO.  60% stenosis of the distal left CCA secondary to noncalcified plaque, with an area of focal plaque ulceration.  Severe stenosis at takeoff of left vertebral artery from the aorta. A1c 6 and LDL 124 2D echo pending. PT OT and SLP evaluation pending Neurology consulted and recommend aspirin  81 Mg daily + Plavix  75 Mg daily.  Continue rosuvastatin  Await stroke MD follow-up.  Hyperlipidemia LDL 124, goal <70 Continue rosuvastatin  20 mg daily Follow-up LFTs periodically as outpatient  Essential hypertension Allow for permissive hypertension for 48 hours and treat BP only if >220/110  Cognitive  impairment/suspected dementia Per neurology, outpatient follow-up with neurology for possible NPH workup B12, folate, TSH, ESR, RPR normal.  UA unremarkable.  Ambulatory dysfunction Awaiting therapies evaluation.  Chest x-ray abnormality Mild left basilar infiltrate versus atelectasis reported.  Follow-up chest x-ray in 4 to 6 weeks to ensure resolution.  This can be done outpatient via PCP. No clinical concern for pneumonia.  Body mass index is 24.96 kg/m.   DVT prophylaxis: enoxaparin  (LOVENOX ) injection 40 mg Start: 05/15/23 1800     Code Status: Full Code:  Family Communication: Discussed in detail with patient's spouse at bedside. Disposition:  Status is: Observation The patient remains OBS appropriate and will d/c before 2 midnights.     Consultants:   Neurology  Procedures:     Antimicrobials:      Subjective:  Poor historian, likely related to baseline cognitive impairment.  Pleasantly confused.  Oriented to self and place and states that he lives at home with his spouse and uses a walker to ambulate.  Currently without complaints.  Objective:   Vitals:   05/15/23 2357 05/16/23 0000 05/16/23 0435 05/16/23 0700  BP: (!) 127/108  126/86 139/84  Pulse: 62  63 66  Resp: 15  16 14   Temp: 97.9 F (36.6 C)  98.3 F (36.8 C) 98.5 F (36.9 C)  TempSrc: Oral  Oral Oral  SpO2: 94%  94% 97%  Weight:  78.9 kg    Height:  5' 10 (1.778 m)      General exam: Elderly male, moderately built and nourished lying comfortably supine in bed without distress. Respiratory system: Clear to auscultation. Respiratory effort normal. Cardiovascular system: S1 & S2 heard, RRR.  No JVD, murmurs, rubs, gallops or clicks. No pedal edema. Gastrointestinal system: Abdomen is nondistended, soft and nontender. No organomegaly or masses felt. Normal bowel sounds heard. Central nervous system: Alert and oriented x 2. No focal neurological deficits. Extremities: Symmetric 5 x 5  power. Skin: No rashes, lesions or ulcers Psychiatry: Judgement and insight impaired. Mood & affect pleasantly confused.     Data Reviewed:   I have personally reviewed following labs and imaging studies   CBC: Recent Labs  Lab 05/15/23 1117 05/16/23 0658  WBC 7.8 5.9  NEUTROABS 5.4  --   HGB 14.9 14.2  HCT 44.4 41.8  MCV 91.5 88.0  PLT 281 276    Basic Metabolic Panel: Recent Labs  Lab 05/15/23 1117 05/16/23 0658  NA 140 140  K 4.0 3.5  CL 109 106  CO2 24 25  GLUCOSE 121* 102*  BUN 13 11  CREATININE 0.73 0.72  CALCIUM  8.9 9.2  MG 2.1  --     Liver Function Tests: Recent Labs  Lab 05/15/23 1117  AST 16  ALT 13  ALKPHOS 84  BILITOT 0.8  PROT 6.8  ALBUMIN 3.5    CBG: Recent Labs  Lab 05/16/23 0046  GLUCAP 106*    Microbiology Studies:  No results found for this or any previous visit (from the past 240 hours).  Radiology Studies:  CT ANGIO HEAD NECK W WO CM Result Date: 05/15/2023 CLINICAL DATA:  Left parietal, right frontal, and right occipital infarcts on same-day MRI, determine embolic source EXAM: CT ANGIOGRAPHY HEAD AND NECK WITH AND WITHOUT CONTRAST TECHNIQUE: Multidetector CT imaging of the head and neck was performed using the standard protocol during bolus administration of intravenous contrast. Multiplanar CT image reconstructions and MIPs were obtained to evaluate the vascular anatomy. Carotid stenosis measurements (when applicable) are obtained utilizing NASCET criteria, using the distal internal carotid diameter as the denominator. RADIATION DOSE REDUCTION: This exam was performed according to the departmental dose-optimization program which includes automated exposure control, adjustment of the mA and/or kV according to patient size and/or use of iterative reconstruction technique. CONTRAST:  75mL OMNIPAQUE  IOHEXOL  350 MG/ML SOLN COMPARISON:  05/15/2023 CT head, 05/15/2023 MRI head, no prior CTA available FINDINGS: CT HEAD FINDINGS For  noncontrast findings, please see same day CT head. CTA NECK FINDINGS Aortic arch: Variant anatomy, with a common origin of the brachiocephalic and left common carotid artery, and the left vertebral artery originating from the aorta. Imaged portion shows no evidence of aneurysm or dissection. Severe stenosis at the origin of the left vertebral artery. Right carotid system: No evidence of dissection, occlusion, or hemodynamically significant stenosis (greater than 50%). Atherosclerotic disease at the bifurcation and in the proximal ICA is not hemodynamically significant. Left carotid system: Approximately 60% stenosis in the distal left CCA, secondary to noncalcified plaque primarily (series 5, image 215 and series 8, image 118), with an area of focal plaque ulceration (series 5, image 216). No hemodynamically significant stenosis in the left ICA or ECA. No evidence of dissection. Vertebral arteries: Severe stenosis at the takeoff of the left vertebral artery from the aorta. The left vertebral artery is otherwise patent to the skull base, without significant stenosis. Right dominant system. The right vertebral artery is patent from its origin to the skull base, without significant stenosis. No evidence of dissection. Skeleton: No acute osseous abnormality. Degenerative changes in the cervical spine. Other neck: No acute finding. Upper chest: No focal pulmonary opacity or pleural effusion. Review of the MIP  images confirms the above findings CTA HEAD FINDINGS Anterior circulation: Both internal carotid arteries are patent to the termini, with calcifications but without significant stenosis. A1 segments patent. Normal anterior communicating artery. Anterior cerebral arteries are patent to their distal aspects without significant stenosis. No M1 stenosis or occlusion. MCA branches perfused to their distal aspects without significant stenosis. Posterior circulation: Vertebral arteries patent to the vertebrobasilar  junction without significant stenosis. Posterior inferior cerebellar arteries patent proximally. Basilar patent to its distal aspect without significant stenosis. Superior cerebellar arteries patent proximally. Patent P1 segments. PCAs perfused to their distal aspects without significant stenosis. The bilateral posterior communicating arteries are not visualized. Venous sinuses: As permitted by contrast timing, patent. Anatomic variants: None significant. No evidence of aneurysm or vascular malformation. Review of the MIP images confirms the above findings IMPRESSION: 1. No intracranial large vessel occlusion or significant stenosis. 2. Approximately 60% stenosis in the distal left CCA, secondary to noncalcified plaque, with an area of focal plaque ulceration. 3. Severe stenosis at the takeoff of the left vertebral artery from the aorta. Electronically Signed   By: Donald Campion M.D.   On: 05/15/2023 17:51   MR BRAIN WO CONTRAST Result Date: 05/15/2023 CLINICAL DATA:  Mental status change, unknown cause EXAM: MRI HEAD WITHOUT CONTRAST TECHNIQUE: Multiplanar, multiecho pulse sequences of the brain and surrounding structures were obtained without intravenous contrast. COMPARISON:  Same day CT head. FINDINGS: Brain: Small acute or early subacute infarcts in the left parietal and right frontal and occipital lobes. No substantial mass effect. Advanced T2/FLAIR hyperintensities in the white matter compatible with chronic microvascular ischemic change. Cerebral atrophy. No evidence of acute hemorrhage, mass lesion, midline shift or hydrocephalus. Vascular: Major arterial flow voids are maintained at the skull base. Skull and upper cervical spine: Normal marrow signal. Sinuses/Orbits: Mild-to-moderate paranasal sinus mucosal thickening. No acute orbital findings. Other: Small right mastoid effusion. IMPRESSION: 1. Small acute or early subacute infarcts in the left parietal and right frontal and occipital lobes. Given  involvement of multiple vascular territories, consider an embolic etiology. 2. Advanced chronic microvascular ischemic change and cerebral atrophy (ICD10-G31.9). Electronically Signed   By: Gilmore GORMAN Molt M.D.   On: 05/15/2023 15:25   CT Head Wo Contrast Result Date: 05/15/2023 CLINICAL DATA:  Altered mental status. EXAM: CT HEAD WITHOUT CONTRAST TECHNIQUE: Contiguous axial images were obtained from the base of the skull through the vertex without intravenous contrast. RADIATION DOSE REDUCTION: This exam was performed according to the departmental dose-optimization program which includes automated exposure control, adjustment of the mA and/or kV according to patient size and/or use of iterative reconstruction technique. COMPARISON:  MRI brain dated March 08, 2021. FINDINGS: Brain: No evidence of acute infarction, hemorrhage, hydrocephalus, extra-axial collection or mass lesion/mass effect. Stable advanced atrophy and chronic microvascular ischemic changes. Vascular: Calcified atherosclerosis at the skull base. No hyperdense vessel. Skull: Normal. Negative for fracture or focal lesion. Sinuses/Orbits: No acute finding. Other: None. IMPRESSION: 1. No acute intracranial abnormality. 2. Stable advanced atrophy and chronic microvascular ischemic changes. Electronically Signed   By: Elsie ONEIDA Shoulder M.D.   On: 05/15/2023 11:43   DG Chest Port 1 View Result Date: 05/15/2023 CLINICAL DATA:  Altered mental status. EXAM: PORTABLE CHEST 1 VIEW COMPARISON:  None Available. FINDINGS: The heart size and mediastinal contours are within normal limits. Status post left total shoulder arthroplasty. Right lung is clear. Mild left basilar atelectasis or infiltrate is noted with small left pleural effusion. IMPRESSION: Mild left basilar subsegmental atelectasis or infiltrate  is noted with small left pleural effusion. Followup PA and lateral chest X-ray is recommended in 3-4 weeks following trial of antibiotic therapy to ensure  resolution and exclude underlying malignancy. Electronically Signed   By: Lynwood Landy Raddle M.D.   On: 05/15/2023 11:30    Scheduled Meds:     stroke: early stages of recovery book   Does not apply Once   aspirin  EC  81 mg Oral Daily   clopidogrel   75 mg Oral Daily   enoxaparin  (LOVENOX ) injection  40 mg Subcutaneous Q24H   rosuvastatin   20 mg Oral Daily    Continuous Infusions:     LOS: 0 days     Trenda Mar, MD,  FACP, [facility], [facility], [employer]   [facility] [place]      To contact the attending provider between 7A-7P or the covering provider during after hours 7P-7A, please log into the web site [email or web address] and access using universal [place] password for that web site. If you do not have the password, please call the hospital operator.  05/16/2023, 9:16 AM

## 2023-05-17 DIAGNOSIS — I639 Cerebral infarction, unspecified: Secondary | ICD-10-CM | POA: Diagnosis not present

## 2023-05-17 NOTE — Plan of Care (Signed)
  Problem: Education: Goal: Knowledge of disease or condition will improve Outcome: Progressing Goal: Knowledge of secondary prevention will improve (MUST DOCUMENT ALL) Outcome: Progressing Goal: Knowledge of patient specific risk factors will improve Loraine Leriche N/A or DELETE if not current risk factor) Outcome: Progressing   Problem: Ischemic Stroke/TIA Tissue Perfusion: Goal: Complications of ischemic stroke/TIA will be minimized Outcome: Progressing   Problem: Coping: Goal: Will verbalize positive feelings about self Outcome: Progressing Goal: Will identify appropriate support needs Outcome: Progressing   Problem: Health Behavior/Discharge Planning: Goal: Ability to manage health-related needs will improve Outcome: Progressing Goal: Goals will be collaboratively established with patient/family Outcome: Progressing   Problem: Self-Care: Goal: Ability to participate in self-care as condition permits will improve Outcome: Progressing Goal: Verbalization of feelings and concerns over difficulty with self-care will improve Outcome: Progressing Goal: Ability to communicate needs accurately will improve Outcome: Progressing

## 2023-05-17 NOTE — Progress Notes (Signed)
 PROGRESS NOTE   Jared [name].  FMW:[id number]    DOB: 1940-09-22    DOA: 05/15/2023  PCP: Yolande Toribio MATSU, MD   I have briefly reviewed patients previous medical records in [facility].  Chief Complaint  Patient presents with   Altered Mental Status    Brief Hospital Course:  83 y.o. married male, chronic ambulatory dysfunction, uses a motorized scooter to transport in the house, with medical history significant for HTN, cognitive impairment, remote colon cancer with prior right hemicolectomy who presented to the ED for evaluation of abnormal speech (garbled speech and difficulty saying what he wanted to say) and increased confusion from baseline.  LKW 2100 on 05/14/2023.  Admitted for acute embolic strokes.  Neurology consulted.  They recommended ILR.  Neurology signed off 1/10.  Cardiology consulted for ILR.  Awaiting physiatry consultation for admission to CIR.   Assessment & Plan:  Principal Problem:   Acute CVA (cerebrovascular accident) [facility]) Active Problems:   Hypertension   Acute ischemic stroke Suspect embolic etiology given bilaterality of stroke involvement. CT head without contrast 1/9: No acute intracranial abnormality.  Stable advanced atrophy and chronic microvascular ischemic changes MRI brain: Small acute or early subacute infarcts in the left parietal and right frontal and occipital lobes.  Advanced chronic microvascular ischemic change and cerebral atrophy. CTA head and neck: No intracranial LVO.  60% stenosis of the distal left CCA secondary to noncalcified plaque, with an area of focal plaque ulceration.  Severe stenosis at takeoff of left vertebral artery from the aorta. A1c 6 and LDL 124 2D echo: LVEF 70-75% grade 1 diastolic dysfunction B/L LEV dopplers> no DVT PT OT and SLP recommend AIR, consult placed 1/10, input pending.  Delays. Neurology consulted and recommend aspirin  81 Mg daily + Plavix  75 Mg daily x 3 weeks and then aspirin  81 Mg alone  thereafter.  Continue rosuvastatin  Stroke MD follow-up appreciated.  Suspect cardioembolic etiology for stroke.  They recommend loop recorder before discharge to rule out A-fib. Outpatient follow-up with Dr. Chalice at [facility] in about 4 weeks.  Hyperlipidemia LDL 124, goal <70 Continue rosuvastatin  20 mg daily Follow-up LFTs periodically as outpatient  Essential hypertension Allow for permissive hypertension for 48 hours and treat BP only if >220/110  Cognitive impairment/suspected dementia Per neurology, outpatient follow-up with neurology for possible NPH workup B12, folate, TSH, ESR, RPR normal.  UA unremarkable. As per stroke MD, 2020 06-2021-no NPH found per Dr. Chalice Continue follow-up with Dr. Chalice and [facility] Neurologist as outpatient.  Left CCA stenosis: CT head and neck 60% stenosis in the distal left CCA secondary to plaque with ulceration Continue antiplatelets as above and statins. Needs outpatient follow-up with vascular surgery  Ambulatory dysfunction AIR recommended by therapies, being evaluated for same.  Chest x-ray abnormality Mild left basilar infiltrate versus atelectasis reported.  Follow-up chest x-ray in 4 to 6 weeks to ensure resolution.  This can be done outpatient via PCP. No clinical concern for pneumonia.  Aortic root dilatation measuring 38 mm Noted on echo 1/10. Outpatient follow-up.  Body mass index is 24.96 kg/m.   DVT prophylaxis: enoxaparin  (LOVENOX ) injection 40 mg Start: 05/15/23 1800     Code Status: Full Code:  Family Communication: None at bedside Disposition:  Medically stable for DC to CIR, awaiting Rehab MD consultation.  ILR placement is an outpatient procedure.  This can be done on Monday prior to admission to CIR if CIR is approved.  Consulting EP cardiology.  No inpatient  intensity despite care having crossed 2 midnights.     Consultants:   Neurology  Procedures:     Antimicrobials:      Subjective:  Patient  reports that he is doing great and has no complaints.  Pleasantly confused but no agitation.  Per nursing, no acute issues noted.  Objective:   Vitals:   05/17/23 0049 05/17/23 0429 05/17/23 0731 05/17/23 1105  BP: (!) 136/91 114/80 (!) 145/87 121/82  Pulse: 74 67 65 68  Resp: 16 16 16 16   Temp: 98.3 F (36.8 C) 98.2 F (36.8 C) 98.1 F (36.7 C) 98 F (36.7 C)  TempSrc: Oral Oral Oral Oral  SpO2: 96% 96% 97% 96%  Weight:      Height:        General exam: Elderly male, moderately built and nourished lying comfortably supine in bed without distress. Respiratory system: Clear to auscultation. Respiratory effort normal. Cardiovascular system: S1 & S2 heard, RRR. No JVD, murmurs, rubs, gallops or clicks. No pedal edema.  Telemetry personally reviewed: Sinus rhythm. Gastrointestinal system: Abdomen is nondistended, soft and nontender. No organomegaly or masses felt. Normal bowel sounds heard. Central nervous system: Alert and oriented x 2. No focal neurological deficits. Extremities: Symmetric 5 x 5 power. Skin: No rashes, lesions or ulcers Psychiatry: Judgement and insight impaired. Mood & affect pleasantly confused.     Data Reviewed:   I have personally reviewed following labs and imaging studies   CBC: Recent Labs  Lab 05/15/23 1117 05/16/23 0658  WBC 7.8 5.9  NEUTROABS 5.4  --   HGB 14.9 14.2  HCT 44.4 41.8  MCV 91.5 88.0  PLT 281 276    Basic Metabolic Panel: Recent Labs  Lab 05/15/23 1117 05/16/23 0658  NA 140 140  K 4.0 3.5  CL 109 106  CO2 24 25  GLUCOSE 121* 102*  BUN 13 11  CREATININE 0.73 0.72  CALCIUM  8.9 9.2  MG 2.1  --     Liver Function Tests: Recent Labs  Lab 05/15/23 1117  AST 16  ALT 13  ALKPHOS 84  BILITOT 0.8  PROT 6.8  ALBUMIN 3.5    CBG: Recent Labs  Lab 05/16/23 0046  GLUCAP 106*    Microbiology Studies:  No results found for this or any previous visit (from the past 240 hours).  Radiology Studies:  VAS US  LOWER  EXTREMITY VENOUS (DVT) Result Date: 05/16/2023  Lower Venous DVT Study Patient Name:  Taner Rzepka.  Date of Exam:   05/16/2023 Medical Rec #: [id number]         Accession #:    [id number] Date of Birth: 19-Dec-1940        Patient Gender: M Patient Age:   42 years Exam Location:  [facility] Procedure:      VAS US  LOWER EXTREMITY VENOUS (DVT) Referring Phys: Criston Chancellor --------------------------------------------------------------------------------  Indications: Stroke, and Embolic stroke.  Risk Factors: Cancer colon. Comparison Study: No prior exam. Performing Technologist: Edilia Elden Appl  Examination Guidelines: A complete evaluation includes B-mode imaging, spectral Doppler, color Doppler, and power Doppler as needed of all accessible portions of each vessel. Bilateral testing is considered an integral part of a complete examination. Limited examinations for reoccurring indications may be performed as noted. The reflux portion of the exam is performed with the patient in reverse Trendelenburg.  +---------+---------------+---------+-----------+----------+--------------+ RIGHT    CompressibilityPhasicitySpontaneityPropertiesThrombus Aging +---------+---------------+---------+-----------+----------+--------------+ CFV      Full  Yes      Yes                                 +---------+---------------+---------+-----------+----------+--------------+ SFJ      Full           Yes      Yes                                 +---------+---------------+---------+-----------+----------+--------------+ FV Prox  Full                                                        +---------+---------------+---------+-----------+----------+--------------+ FV Mid   Full                                                        +---------+---------------+---------+-----------+----------+--------------+ FV DistalFull                                                         +---------+---------------+---------+-----------+----------+--------------+ PFV      Full                                                        +---------+---------------+---------+-----------+----------+--------------+ POP      Full           Yes      Yes                                 +---------+---------------+---------+-----------+----------+--------------+ PTV      Full                                                        +---------+---------------+---------+-----------+----------+--------------+ PERO     Full                                                        +---------+---------------+---------+-----------+----------+--------------+   +---------+---------------+---------+-----------+----------+--------------+ LEFT     CompressibilityPhasicitySpontaneityPropertiesThrombus Aging +---------+---------------+---------+-----------+----------+--------------+ CFV      Full           Yes      Yes                                 +---------+---------------+---------+-----------+----------+--------------+ SFJ      Full  Yes      Yes                                 +---------+---------------+---------+-----------+----------+--------------+ FV Prox  Full                                                        +---------+---------------+---------+-----------+----------+--------------+ FV Mid   Full                                                        +---------+---------------+---------+-----------+----------+--------------+ FV DistalFull                                                        +---------+---------------+---------+-----------+----------+--------------+ PFV      Full                                                        +---------+---------------+---------+-----------+----------+--------------+ POP      Full           Yes      Yes                                  +---------+---------------+---------+-----------+----------+--------------+ PTV      Full                                                        +---------+---------------+---------+-----------+----------+--------------+ PERO     Full                                                        +---------+---------------+---------+-----------+----------+--------------+     Summary: BILATERAL: - No evidence of deep vein thrombosis seen in the lower extremities, bilaterally. -No evidence of popliteal cyst, bilaterally.   *See table(s) above for measurements and observations. Electronically signed by Penne Colorado MD on 05/16/2023 at 7:17:54 PM.    Final    ECHOCARDIOGRAM COMPLETE Result Date: 05/16/2023    ECHOCARDIOGRAM REPORT   Patient Name:   Keighan Amezcua. Date of Exam: 05/16/2023 Medical Rec #:  [id number]        Height:       70.0 in Accession #:    [id number]       Weight:       173.9 lb Date of Birth:  22-Oct-1940       BSA:  1.967 m Patient Age:    82 years         BP:           126/86 mmHg Patient Gender: M                HR:           78 bpm. Exam Location:  Inpatient Procedure: 2D Echo, Color Doppler and Cardiac Doppler Indications:   Stroke I63.9  History:       Patient has no prior history of Echocardiogram examinations.                Stroke.  Sonographer:   Tinnie Gosling RDCS Referring      [phone] Less [place] D Scott Fix Phys: IMPRESSIONS  1. Left ventricular ejection fraction, by estimation, is 70 to 75%. The left ventricle has hyperdynamic function. The left ventricle has no regional wall motion abnormalities. Left ventricular diastolic parameters are consistent with Grade I diastolic dysfunction (impaired relaxation).  2. Right ventricular systolic function is normal. The right ventricular size is normal.  3. The mitral valve is normal in structure. No evidence of mitral valve regurgitation. No evidence of mitral stenosis.  4. The aortic valve is tricuspid. There is moderate calcification of  the aortic valve. Aortic valve regurgitation is not visualized. Aortic valve sclerosis/calcification is present, without any evidence of aortic stenosis.  5. Aortic dilatation noted. There is borderline dilatation of the aortic root, measuring 38 mm.  6. The inferior vena cava is normal in size with greater than 50% respiratory variability, suggesting right atrial pressure of 3 mmHg. FINDINGS  Left Ventricle: Left ventricular ejection fraction, by estimation, is 70 to 75%. The left ventricle has hyperdynamic function. The left ventricle has no regional wall motion abnormalities. The left ventricular internal cavity size was normal in size. There is no left ventricular hypertrophy. Left ventricular diastolic parameters are consistent with Grade I diastolic dysfunction (impaired relaxation). Right Ventricle: The right ventricular size is normal. No increase in right ventricular wall thickness. Right ventricular systolic function is normal. Left Atrium: Left atrial size was normal in size. Right Atrium: Right atrial size was normal in size. Pericardium: There is no evidence of pericardial effusion. Mitral Valve: The mitral valve is normal in structure. No evidence of mitral valve regurgitation. No evidence of mitral valve stenosis. Tricuspid Valve: The tricuspid valve is normal in structure. Tricuspid valve regurgitation is trivial. No evidence of tricuspid stenosis. Aortic Valve: The aortic valve is tricuspid. There is moderate calcification of the aortic valve. Aortic valve regurgitation is not visualized. Aortic valve sclerosis/calcification is present, without any evidence of aortic stenosis. Pulmonic Valve: The pulmonic valve was normal in structure. Pulmonic valve regurgitation is not visualized. No evidence of pulmonic stenosis. Aorta: Aortic dilatation noted. There is borderline dilatation of the aortic root, measuring 38 mm. Venous: The inferior vena cava is normal in size with greater than 50% respiratory  variability, suggesting right atrial pressure of 3 mmHg. IAS/Shunts: No atrial level shunt detected by color flow Doppler.  LEFT VENTRICLE PLAX 2D LVIDd:         4.50 cm   Diastology LVIDs:         3.00 cm   LV e' medial:    6.42 cm/s LV PW:         1.00 cm   LV E/e' medial:  7.5 LV IVS:        1.10 cm   LV e' lateral:   4.46 cm/s LVOT  diam:     2.30 cm   LV E/e' lateral: 10.8 LV SV:         66 LV SV Index:   33 LVOT Area:     4.15 cm  RIGHT VENTRICLE             IVC RV S prime:     20.60 cm/s  IVC diam: 1.80 cm TAPSE (M-mode): 2.3 cm LEFT ATRIUM           Index LA diam:      3.50 cm 1.78 cm/m LA Vol (A4C): 41.1 ml 20.89 ml/m  AORTIC VALVE LVOT Vmax:   82.90 cm/s LVOT Vmean:  60.400 cm/s LVOT VTI:    0.158 m  AORTA Ao Root diam: 3.80 cm Ao Asc diam:  3.60 cm MITRAL VALVE MV Area (PHT): 2.97 cm    SHUNTS MV E velocity: 48.00 cm/s  Systemic VTI:  0.16 m MV A velocity: 78.00 cm/s  Systemic Diam: 2.30 cm MV E/A ratio:  0.62 Toribio Fuel MD Electronically signed by Toribio Fuel MD Signature Date/Time: 05/16/2023/11:02:12 AM    Final    CT ANGIO HEAD NECK W WO CM Result Date: 05/15/2023 CLINICAL DATA:  Left parietal, right frontal, and right occipital infarcts on same-day MRI, determine embolic source EXAM: CT ANGIOGRAPHY HEAD AND NECK WITH AND WITHOUT CONTRAST TECHNIQUE: Multidetector CT imaging of the head and neck was performed using the standard protocol during bolus administration of intravenous contrast. Multiplanar CT image reconstructions and MIPs were obtained to evaluate the vascular anatomy. Carotid stenosis measurements (when applicable) are obtained utilizing NASCET criteria, using the distal internal carotid diameter as the denominator. RADIATION DOSE REDUCTION: This exam was performed according to the departmental dose-optimization program which includes automated exposure control, adjustment of the mA and/or kV according to patient size and/or use of iterative reconstruction technique.  CONTRAST:  75mL OMNIPAQUE  IOHEXOL  350 MG/ML SOLN COMPARISON:  05/15/2023 CT head, 05/15/2023 MRI head, no prior CTA available FINDINGS: CT HEAD FINDINGS For noncontrast findings, please see same day CT head. CTA NECK FINDINGS Aortic arch: Variant anatomy, with a common origin of the brachiocephalic and left common carotid artery, and the left vertebral artery originating from the aorta. Imaged portion shows no evidence of aneurysm or dissection. Severe stenosis at the origin of the left vertebral artery. Right carotid system: No evidence of dissection, occlusion, or hemodynamically significant stenosis (greater than 50%). Atherosclerotic disease at the bifurcation and in the proximal ICA is not hemodynamically significant. Left carotid system: Approximately 60% stenosis in the distal left CCA, secondary to noncalcified plaque primarily (series 5, image 215 and series 8, image 118), with an area of focal plaque ulceration (series 5, image 216). No hemodynamically significant stenosis in the left ICA or ECA. No evidence of dissection. Vertebral arteries: Severe stenosis at the takeoff of the left vertebral artery from the aorta. The left vertebral artery is otherwise patent to the skull base, without significant stenosis. Right dominant system. The right vertebral artery is patent from its origin to the skull base, without significant stenosis. No evidence of dissection. Skeleton: No acute osseous abnormality. Degenerative changes in the cervical spine. Other neck: No acute finding. Upper chest: No focal pulmonary opacity or pleural effusion. Review of the MIP images confirms the above findings CTA HEAD FINDINGS Anterior circulation: Both internal carotid arteries are patent to the termini, with calcifications but without significant stenosis. A1 segments patent. Normal anterior communicating artery. Anterior cerebral arteries are patent to their distal aspects  without significant stenosis. No M1 stenosis or  occlusion. MCA branches perfused to their distal aspects without significant stenosis. Posterior circulation: Vertebral arteries patent to the vertebrobasilar junction without significant stenosis. Posterior inferior cerebellar arteries patent proximally. Basilar patent to its distal aspect without significant stenosis. Superior cerebellar arteries patent proximally. Patent P1 segments. PCAs perfused to their distal aspects without significant stenosis. The bilateral posterior communicating arteries are not visualized. Venous sinuses: As permitted by contrast timing, patent. Anatomic variants: None significant. No evidence of aneurysm or vascular malformation. Review of the MIP images confirms the above findings IMPRESSION: 1. No intracranial large vessel occlusion or significant stenosis. 2. Approximately 60% stenosis in the distal left CCA, secondary to noncalcified plaque, with an area of focal plaque ulceration. 3. Severe stenosis at the takeoff of the left vertebral artery from the aorta. Electronically Signed   By: Donald Campion M.D.   On: 05/15/2023 17:51   MR BRAIN WO CONTRAST Result Date: 05/15/2023 CLINICAL DATA:  Mental status change, unknown cause EXAM: MRI HEAD WITHOUT CONTRAST TECHNIQUE: Multiplanar, multiecho pulse sequences of the brain and surrounding structures were obtained without intravenous contrast. COMPARISON:  Same day CT head. FINDINGS: Brain: Small acute or early subacute infarcts in the left parietal and right frontal and occipital lobes. No substantial mass effect. Advanced T2/FLAIR hyperintensities in the white matter compatible with chronic microvascular ischemic change. Cerebral atrophy. No evidence of acute hemorrhage, mass lesion, midline shift or hydrocephalus. Vascular: Major arterial flow voids are maintained at the skull base. Skull and upper cervical spine: Normal marrow signal. Sinuses/Orbits: Mild-to-moderate paranasal sinus mucosal thickening. No acute orbital findings.  Other: Small right mastoid effusion. IMPRESSION: 1. Small acute or early subacute infarcts in the left parietal and right frontal and occipital lobes. Given involvement of multiple vascular territories, consider an embolic etiology. 2. Advanced chronic microvascular ischemic change and cerebral atrophy (ICD10-G31.9). Electronically Signed   By: Gilmore GORMAN Molt M.D.   On: 05/15/2023 15:25    Scheduled Meds:    aspirin  EC  81 mg Oral Daily   clopidogrel   75 mg Oral Daily   enoxaparin  (LOVENOX ) injection  40 mg Subcutaneous Q24H   rosuvastatin   20 mg Oral Daily    Continuous Infusions:     LOS: 0 days     Trenda Mar, MD,  FACP, [facility], [facility], [facility]   [facility] [place]      To contact the attending provider between 7A-7P or the covering provider during after hours 7P-7A, please log into the web site [email or web address] and access using universal [place] password for that web site. If you do not have the password, please call the hospital operator.  05/17/2023, 3:10 PM

## 2023-05-17 NOTE — PMR Pre-admission (Signed)
 PMR Admission Coordinator Pre-Admission Assessment  Patient: Jared [name]. is an 83 y.o., male MRN: [id number] DOB: 08-04-1940 Height: 5' 10 (177.8 cm) Weight: 78.9 kg  Insurance Information HMO:     PPO:      PCP:      IPA:      80/20:      OTHER:  PRIMARY:  [employer]      Policy#: [id number]       Subscriber: pt CM Name: Jared [name]      Phone#: [phone] ext [phone]     Fax#: [phone] Pre-Cert#: [id number]  Received insurance authorization from [employer]. Pt approved from 05/19/23-05/26/23. Updates due on 05/26/23.      Employer:  Benefits:  Phone #: online at [email or web address]     Name:  Eff. Date: 05/07/23     Deduct: does not have one      Out of Pocket Max: does not have one      Life Max:  CIR: $395/day co-pay with max co-pay of $1,975/admission      SNF: 100% coverage for days 1-20, $214/day co-pay for days 21-100 Outpatient: $25 co-pay/visit     Co-Pay:  Home Health: 100% coverage      Co-Pay:  DME: 80% coverage     Co-Pay: 20%  co-insurance Providers: in-network SECONDARY: Tricare      Policy#:  [id number]         Phone#:   Financial Counselor:       Phone#:   The "Data Collection Information Summary" for patients in Inpatient Rehabilitation Facilities with attached "Privacy Act Statement-Health Care Records" was provided and verbally reviewed with: Patient  Emergency Contact Information Contact Information     Name Relation Home Work Mobile   Jared [name] Spouse [phone]  [phone]      Other Contacts     Name Relation Home Work Mobile   Mcniel,[name]   [phone]       Current Medical History  Patient Admitting Diagnosis: CVA History of Present Illness: Jared [name]. 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  Per chart  review patient lives with spouse.  Multi level home 4 steps to entry.  Bed and bath on main level.  Uses an electric wheelchair for mobility in the home, furniture service and bathroom.  Rollator to get into the car.  Has a home health aide 2 times a week.  Presented 05/15/2023 with increasing confusion with garbled speech.  CT/MRI showed small acute/early subacute infarcts in the left parietal and right frontal occipital lobes.  CTA with no intracranial large vessel occlusion or significant stenosis.  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  Patient recently had an outpatient MRI at the [facility] (04/14/2019 24th) for ataxia and gait abnormality that showed no acute abnormality at that time concern for possible NPH with prior lacunar infarct.  Underwent an LP to further evaluate for NPH.  Opening pressure was not in chart review, labs unremarkable.  Patient had a neurology visit June 2024 with Dr. Chalice for evaluation of gait difficulty.  Appointment notes states that neuropathy started while on chemotherapy in 2019 walking difficulties in 2019 began with some falls in 2021.  Admission chemistries on this latest admission unremarkable.  Echocardiogram with ejection fraction of 70 to 75%.  Grade 1 diastolic dysfunction.  Neurology follow-up currently maintained  on aspirin  81 mg daily and Plavix  75 mg daily for CVA prophylaxis x 3 weeks then aspirin  alone.  Lovenox  added for DVT prophylaxis.  Patient did undergo loop recorder placement 1/13 for monitoring for A-fib per Dr. Fonda [name].  In regards to left CCA stenosis noted on CTA during workup of CVA patient would receive outpatient follow-up with vascular surgery.  Tolerating a regular consistency diet.  Therapy evaluations completed due to patient decreased functional mobility was admitted for a comprehensive rehab program.   Complete NIHSS TOTAL: 1  Patient's medical record from [facility]  has been  reviewed by the rehabilitation admission coordinator and physician.  Past Medical History  Past Medical History:  Diagnosis Date   colon cancer    History of kidney stones    Hypertension    Patient denies   Neuropathy due to chemotherapeutic drug [facility])    Neurologist says doesnt have it   Pre-diabetes     Has the patient had major surgery during 100 days prior to admission? No  Family History   family history includes Heart failure in his father; Hodgkin's lymphoma in his mother; Other in his mother.  Current Medications  Current Facility-Administered Medications:    acetaminophen  (TYLENOL ) tablet 650 mg, 650 mg, Oral, Q4H PRN **OR** acetaminophen  (TYLENOL ) 160 MG/5ML solution 650 mg, 650 mg, Per Tube, Q4H PRN **OR** acetaminophen  (TYLENOL ) suppository 650 mg, 650 mg, Rectal, Q4H PRN, Jared Jorie SAUNDERS, [name]   aspirin  EC tablet 81 mg, 81 mg, Oral, Daily, Judithe, Erin C, NP, 81 mg at 05/17/23 9070   clopidogrel  (PLAVIX ) tablet 75 mg, 75 mg, Oral, Daily, Judithe, Erin C, NP, 75 mg at 05/17/23 0930   enoxaparin  (LOVENOX ) injection 40 mg, 40 mg, Subcutaneous, Q24H, Patel, Jared [name], 40 mg at 05/16/23 1820   hydrALAZINE  (APRESOLINE ) injection 10 mg, 10 mg, Intravenous, Q4H PRN, Jared, Jared [name]   ondansetron  (ZOFRAN ) injection 4 mg, 4 mg, Intravenous, Q6H PRN, Patel, Jared [name]   rosuvastatin  (CRESTOR ) tablet 20 mg, 20 mg, Oral, Daily, Jared Pfeiffer, [name], 20 mg at 05/17/23 9070   senna-docusate (Senokot-S) tablet 1 tablet, 1 tablet, Oral, QHS PRN, Jared Jorie SAUNDERS, [name], 1 tablet at 05/16/23 0931  Patients Current Diet:  Diet Order             Diet Heart Room service appropriate? Yes; Fluid consistency: Thin  Diet effective now                   Precautions / Restrictions Precautions Precautions: Fall Restrictions Weight Bearing Restrictions Per Provider Order: No   Has the patient had 2 or more falls or a fall with injury in the past year? Yes  Prior Activity  Level Limited Community (1-2x/wk): Pt. went out about once a week  Prior Functional Level Self Care: Did the patient need help bathing, dressing, using the toilet or eating? Independent  Indoor Mobility: Did the patient need assistance with walking from room to room (with or without device)? Independent  Stairs: Did the patient need assistance with internal or external stairs (with or without device)? Independent  Functional Cognition: Did the patient need help planning regular tasks such as shopping or remembering to take medications? Needed some help  Patient Information Are you of Hispanic, Latino/a,or Spanish origin?: A. No, not of Hispanic, Latino/a, or Spanish origin What is your race?: A. White Do you need or want an interpreter to communicate with a doctor or health care staff?:  0. No  Patient's Response To:  Health Literacy and Transportation Is the patient able to respond to health literacy and transportation needs?: Yes Health Literacy - How often do you need to have someone help you when you read instructions, pamphlets, or other written material from your doctor or pharmacy?: Never In the past 12 months, has lack of transportation kept you from medical appointments or from getting medications?: No In the past 12 months, has lack of transportation kept you from meetings, work, or from getting things needed for daily living?: No  Home Assistive Devices / Equipment Home Equipment: Agricultural Consultant (2 wheels), Rollator (4 wheels), Grab bars - tub/shower, Hand held shower head, Shower seat - built in, Art gallery manager  Prior Device Use: Indicate devices/aids used by the patient prior to current illness, exacerbation or injury?  Electric scooter  Current Functional Level Cognition  Arousal/Alertness: Awake/alert Overall Cognitive Status: Impaired/Different from baseline (does have history of cognitive impairments but family states worse now) Orientation Level: Disoriented to  time Attention: Sustained Sustained Attention: Appears intact Memory: Impaired Memory Impairment: Retrieval deficit (3/5 word recall) Awareness: Impaired Awareness Impairment: Intellectual impairment, Emergent impairment, Anticipatory impairment Problem Solving: Impaired Problem Solving Impairment: Functional basic Safety/Judgment: Impaired    Extremity Assessment (includes Sensation/Coordination)  Upper Extremity Assessment: Generalized weakness  Lower Extremity Assessment: RLE deficits/detail, LLE deficits/detail RLE Deficits / Details: AROM WFL, strength grossly 3+/5 RLE Sensation: decreased light touch RLE Coordination: decreased fine motor, decreased gross motor LLE Deficits / Details: AROM WFL, strength grossly 4/5 LLE Sensation: decreased light touch LLE Coordination: decreased fine motor    ADLs  Overall ADL's : Needs assistance/impaired Eating/Feeding: Independent Grooming: Contact guard assist, Standing Upper Body Bathing: Set up, Supervision/ safety, Sitting Lower Body Bathing: Moderate assistance, Sit to/from stand Upper Body Dressing : Set up, Sitting Lower Body Dressing: Moderate assistance, Sit to/from stand Toilet Transfer: Moderate assistance, Rolling walker (2 wheels), Ambulation, Regular Toilet Toilet Transfer Details (indicate cue type and reason): mod A for mgmt of RW and problem solving turns in a smaller space Toileting- Clothing Manipulation and Hygiene: Contact guard assist, Sitting/lateral lean, Sit to/from stand Functional mobility during ADLs: Moderate assistance, Rolling walker (2 wheels) General ADL Comments: limited by generalized weakness, unsteady balance, impaired cognition    Mobility  Overal bed mobility: Needs Assistance Bed Mobility: Supine to Sit Supine to sit: Min assist    Transfers  Overall transfer level: Needs assistance Equipment used: Rolling walker (2 wheels) Transfers: Sit to/from Stand Sit to Stand: Min assist General  transfer comment: cues for hand placement    Ambulation / Gait / Stairs / Wheelchair Mobility  Ambulation/Gait Ambulation/Gait assistance: Min assist, +2 physical assistance Gait Distance (Feet): 20 Feet Assistive device: Rolling walker (2 wheels) Gait Pattern/deviations: Step-to pattern, Decreased stance time - right, Decreased step length - right, Decreased dorsiflexion - right, Decreased weight shift to right General Gait Details: minAx2 for ambulation due to decreased coordination, pt able to show large amplitude hip and knee flexion with marching in place however with ambulation pt requires constant cuing for picking up his [name] foot and moving it forward, pt has pauses in his ambulation as though he forgets where he is going and needs cuing to continue. Gait velocity: variable Gait velocity interpretation: <1.8 ft/sec, indicate of risk for recurrent falls Pre-gait activities: marching in place    Posture / Balance Balance Overall balance assessment: Needs assistance Sitting-balance support: Feet supported Sitting balance-Leahy Scale: Good Standing balance support: Single extremity supported, During  functional activity Standing balance-Leahy Scale: Fair Standing balance comment: statically, dynamically he needs BUE supported for safety and balance    Special needs/care consideration Skin intact  and Special service needs none   Previous Home Environment (from acute therapy documentation) Living Arrangements: Spouse/significant other  Lives With: Spouse Available Help at Discharge: Family, Available 24 hours/day Type of Home: House Home Layout: One level, Multi-level, Able to live on main level with bedroom/bathroom Home Access: Stairs to enter Entrance Stairs-Rails: Left Entrance Stairs-Number of Steps: 4 Bathroom Shower/Tub: Health Visitor: Handicapped height Bathroom Accessibility: Yes  Discharge Living Setting Plans for Discharge Living Setting: House Type  of Home at Discharge: House Discharge Home Layout: One level Discharge Home Access: Stairs to enter Entrance Stairs-Rails: Left Entrance Stairs-Number of Steps: 3 Discharge Bathroom Shower/Tub: Walk-in shower Discharge Bathroom Toilet: Handicapped height Discharge Bathroom Accessibility: Yes How Accessible: Accessible via walker Does the patient have any problems obtaining your medications?: No  Social/Family/Support Systems Patient Roles: Spouse Contact Information: [phone] Anticipated Caregiver: 24/7 Anticipated Caregiver's Contact Information: [place] Ability/Limitations of Caregiver: 24/7 min A Caregiver Availability: 24/7 Discharge Plan Discussed with Primary Caregiver: No Is Caregiver In Agreement with Plan?: Yes Does Caregiver/Family have Issues with Lodging/Transportation while Pt is in Rehab?: No  Goals Patient/Family Goal for Rehab: PT/OT/SLP Supervision level Expected length of stay: 7-10 days Pt/Family Agrees to Admission and willing to participate: Yes Program Orientation Provided & Reviewed with Pt/Caregiver Including Roles  & Responsibilities: Yes  Decrease burden of Care through IP rehab admission: not anticipated  Possible need for SNF placement upon discharge: not anticipated  Patient Condition: I have reviewed medical records from [facility] , spoken with CM, and patient and spouse. I discussed via phone for inpatient rehabilitation assessment.  Patient will benefit from ongoing PT, OT, and SLP, can actively participate in 3 hours of therapy a day 5 days of the week, and can make measurable gains during the admission.  Patient will also benefit from the coordinated team approach during an Inpatient Acute Rehabilitation admission.  The patient will receive intensive therapy as well as Rehabilitation physician, nursing, social worker, and care management interventions.  Due to safety, skin/wound care, disease management, medication administration,  pain management, and patient education the patient requires 24 hour a day rehabilitation nursing.  The patient is currently min A with mobility and basic ADLs.  Discharge setting and therapy post discharge at home with home health is anticipated.  Patient has agreed to participate in the Acute Inpatient Rehabilitation Program and will admit today.  Preadmission Screen Completed By:  Leita KATHEE Kleine, 05/17/2023 12:29 PM ______________________________________________________________________   Discussed status with Dr. Urbano on 05/21/23 at 930 and received approval for admission today.  Admission Coordinator:  Leita KATHEE Kleine, CCC-SLP, time 1013/Date 05/21/23   Assessment/Plan: Diagnosis: CVA Does the need for close, 24 hr/day Medical supervision in concert with the patient's rehab needs make it unreasonable for this patient to be served in a less intensive setting? Yes Co-Morbidities requiring supervision/potential complications: HLD, Prior NPH, HTN, neuropathy, Left CCA stenosis, Aortic root dilation Due to bladder management, bowel management, safety, skin/wound care, disease management, medication administration, pain management, and patient education, does the patient require 24 hr/day rehab nursing? Yes Does the patient require coordinated care of a physician, rehab nurse, PT, OT, and SLP to address physical and functional deficits in the context of the above medical diagnosis(es)? Yes Addressing deficits in the following areas: balance, endurance, locomotion, strength, transferring, bowel/bladder control,  bathing, dressing, feeding, grooming, toileting, cognition, speech, language, and psychosocial support Can the patient actively participate in an intensive therapy program of at least 3 hrs of therapy 5 days a week? Yes The potential for patient to make measurable gains while on inpatient rehab is excellent Anticipated functional outcomes upon discharge from inpatient rehab: supervision PT,  supervision OT, supervision SLP Estimated rehab length of stay to reach the above functional goals is: 7-10  Anticipated discharge destination: Home 10. Overall Rehab/Functional Prognosis: excellent   [name] Signature: Murray Collier

## 2023-05-17 NOTE — Plan of Care (Signed)

## 2023-05-18 DIAGNOSIS — I639 Cerebral infarction, unspecified: Secondary | ICD-10-CM | POA: Diagnosis not present

## 2023-05-18 NOTE — Progress Notes (Signed)
 PROGRESS NOTE   Jared [name].  FMW:[id number]    DOB: Sep 01, 1940    DOA: 05/15/2023  PCP: Yolande Toribio MATSU, MD   I have briefly reviewed patients previous medical records in [facility].  Chief Complaint  Patient presents with   Altered Mental Status    Brief Hospital Course:  83 y.o. married male, chronic ambulatory dysfunction, uses a motorized scooter to transport in the house, with medical history significant for HTN, cognitive impairment, remote colon cancer with prior right hemicolectomy who presented to the ED for evaluation of abnormal speech (garbled speech and difficulty saying what he wanted to say) and increased confusion from baseline.  LKW 2100 on 05/14/2023.  Admitted for acute embolic strokes.  Neurology consulted.  They recommended ILR.  Neurology signed off 1/10.  Cardiology consulted for ILR.  Awaiting physiatry consultation for admission to CIR.  No new events or acute findings.  Patient is waiting to get his loop recorder placed by EP Cardiology on 11/13 (confirmed with cardiology that he is on the list), and still waiting for consultation by rehab MD.  Avoidable delays.   Assessment & Plan:  Principal Problem:   Acute CVA (cerebrovascular accident) [facility]) Active Problems:   Hypertension   Acute ischemic stroke Suspect embolic etiology given bilaterality of stroke involvement. CT head without contrast 1/9: No acute intracranial abnormality.  Stable advanced atrophy and chronic microvascular ischemic changes MRI brain: Small acute or early subacute infarcts in the left parietal and right frontal and occipital lobes.  Advanced chronic microvascular ischemic change and cerebral atrophy. CTA head and neck: No intracranial LVO.  60% stenosis of the distal left CCA secondary to noncalcified plaque, with an area of focal plaque ulceration.  Severe stenosis at takeoff of left vertebral artery from the aorta. A1c 6 and LDL 124 2D echo: LVEF 70-75% grade 1 diastolic  dysfunction B/L LEV dopplers> no DVT PT OT and SLP recommend AIR, consult placed 1/10, input pending.  Delays. Neurology consulted and recommend aspirin  81 Mg daily + Plavix  75 Mg daily x 3 weeks and then aspirin  81 Mg alone thereafter.  Continue rosuvastatin  Stroke MD follow-up appreciated.  Suspect cardioembolic etiology for stroke.  They recommend loop recorder before discharge to rule out A-fib. Outpatient follow-up with Dr. Chalice at [facility] in about 4 weeks.  Hyperlipidemia LDL 124, goal <70 Continue rosuvastatin  20 mg daily Follow-up LFTs periodically as outpatient  Essential hypertension Allow for permissive hypertension for 48 hours and treat BP only if >220/110  Cognitive impairment/suspected dementia Per neurology, outpatient follow-up with neurology for possible NPH workup B12, folate, TSH, ESR, RPR normal.  UA unremarkable. As per stroke MD, 2020 06-2021-no NPH found per Dr. Chalice Continue follow-up with Dr. Chalice and [facility] Neurologist as outpatient.  Left CCA stenosis: CT head and neck 60% stenosis in the distal left CCA secondary to plaque with ulceration Continue antiplatelets as above and statins. Needs outpatient follow-up with vascular surgery  Ambulatory dysfunction AIR recommended by therapies, being evaluated for same.  Chest x-ray abnormality Mild left basilar infiltrate versus atelectasis reported.  Follow-up chest x-ray in 4 to 6 weeks to ensure resolution.  This can be done outpatient via PCP. No clinical concern for pneumonia.  Aortic root dilatation measuring 38 mm Noted on echo 1/10. Outpatient follow-up.  Body mass index is 24.96 kg/m.   DVT prophylaxis: enoxaparin  (LOVENOX ) injection 40 mg Start: 05/15/23 1800     Code Status: Full Code:  Family Communication: None at bedside  Disposition:  Medically stable for DC to CIR, awaiting Rehab MD consultation.  ILR placement is an outpatient procedure.  This can be done on Monday prior to admission  to CIR if CIR is approved.  Consulting EP cardiology.  No inpatient intensity despite care having crossed 2 midnights.     Consultants:   Neurology  Procedures:     Antimicrobials:      Subjective:  Pleasantly confused without complaints.  Objective:   Vitals:   05/17/23 2005 05/17/23 2354 05/18/23 0330 05/18/23 0729  BP: (!) 132/92 125/85 121/76 127/83  Pulse: 63 (!) 58 (!) 54 63  Resp: 16 18 20 17   Temp: 98.4 F (36.9 C) 98.3 F (36.8 C) 97.7 F (36.5 C) (!) 97.5 F (36.4 C)  TempSrc: Oral Oral Oral Oral  SpO2: 97% 97% 96% 95%  Weight:      Height:       Stable exam and unchanged.  General exam: Elderly male, moderately built and nourished lying comfortably supine in bed without distress. Respiratory system: Clear to auscultation. Respiratory effort normal. Cardiovascular system: S1 & S2 heard, RRR. No JVD, murmurs, rubs, gallops or clicks. No pedal edema.  Telemetry personally reviewed: Sinus rhythm. Gastrointestinal system: Abdomen is nondistended, soft and nontender. No organomegaly or masses felt. Normal bowel sounds heard. Central nervous system: Alert and oriented x 2. No focal neurological deficits. Extremities: Symmetric 5 x 5 power. Skin: No rashes, lesions or ulcers Psychiatry: Judgement and insight impaired. Mood & affect pleasantly confused.     Data Reviewed:   I have personally reviewed following labs and imaging studies   CBC: Recent Labs  Lab 05/15/23 1117 05/16/23 0658  WBC 7.8 5.9  NEUTROABS 5.4  --   HGB 14.9 14.2  HCT 44.4 41.8  MCV 91.5 88.0  PLT 281 276    Basic Metabolic Panel: Recent Labs  Lab 05/15/23 1117 05/16/23 0658  NA 140 140  K 4.0 3.5  CL 109 106  CO2 24 25  GLUCOSE 121* 102*  BUN 13 11  CREATININE 0.73 0.72  CALCIUM  8.9 9.2  MG 2.1  --     Liver Function Tests: Recent Labs  Lab 05/15/23 1117  AST 16  ALT 13  ALKPHOS 84  BILITOT 0.8  PROT 6.8  ALBUMIN 3.5    CBG: Recent Labs  Lab  05/16/23 0046  GLUCAP 106*    Microbiology Studies:  No results found for this or any previous visit (from the past 240 hours).  Radiology Studies:  VAS US  LOWER EXTREMITY VENOUS (DVT) Result Date: 05/16/2023  Lower Venous DVT Study Patient Name:  Jared [name].  Date of Exam:   05/16/2023 Medical Rec #: [id number]         Accession #:    [id number] Date of Birth: 1940-12-02        Patient Gender: M Patient Age:   57 years Exam Location:  [facility] Procedure:      VAS US  LOWER EXTREMITY VENOUS (DVT) Referring Phys: Krrish Freund --------------------------------------------------------------------------------  Indications: Stroke, and Embolic stroke.  Risk Factors: Cancer colon. Comparison Study: No prior exam. Performing Technologist: Edilia Elden Appl  Examination Guidelines: A complete evaluation includes B-mode imaging, spectral Doppler, color Doppler, and power Doppler as needed of all accessible portions of each vessel. Bilateral testing is considered an integral part of a complete examination. Limited examinations for reoccurring indications may be performed as noted. The reflux portion of the exam is performed with the patient  in reverse Trendelenburg.  +---------+---------------+---------+-----------+----------+--------------+ RIGHT    CompressibilityPhasicitySpontaneityPropertiesThrombus Aging +---------+---------------+---------+-----------+----------+--------------+ CFV      Full           Yes      Yes                                 +---------+---------------+---------+-----------+----------+--------------+ SFJ      Full           Yes      Yes                                 +---------+---------------+---------+-----------+----------+--------------+ FV Prox  Full                                                        +---------+---------------+---------+-----------+----------+--------------+ FV Mid   Full                                                         +---------+---------------+---------+-----------+----------+--------------+ FV DistalFull                                                        +---------+---------------+---------+-----------+----------+--------------+ PFV      Full                                                        +---------+---------------+---------+-----------+----------+--------------+ POP      Full           Yes      Yes                                 +---------+---------------+---------+-----------+----------+--------------+ PTV      Full                                                        +---------+---------------+---------+-----------+----------+--------------+ PERO     Full                                                        +---------+---------------+---------+-----------+----------+--------------+   +---------+---------------+---------+-----------+----------+--------------+ LEFT     CompressibilityPhasicitySpontaneityPropertiesThrombus Aging +---------+---------------+---------+-----------+----------+--------------+ CFV      Full           Yes      Yes                                 +---------+---------------+---------+-----------+----------+--------------+  SFJ      Full           Yes      Yes                                 +---------+---------------+---------+-----------+----------+--------------+ FV Prox  Full                                                        +---------+---------------+---------+-----------+----------+--------------+ FV Mid   Full                                                        +---------+---------------+---------+-----------+----------+--------------+ FV DistalFull                                                        +---------+---------------+---------+-----------+----------+--------------+ PFV      Full                                                         +---------+---------------+---------+-----------+----------+--------------+ POP      Full           Yes      Yes                                 +---------+---------------+---------+-----------+----------+--------------+ PTV      Full                                                        +---------+---------------+---------+-----------+----------+--------------+ PERO     Full                                                        +---------+---------------+---------+-----------+----------+--------------+     Summary: BILATERAL: - No evidence of deep vein thrombosis seen in the lower extremities, bilaterally. -No evidence of popliteal cyst, bilaterally.   *See table(s) above for measurements and observations. Electronically signed by Penne Colorado MD on 05/16/2023 at 7:17:54 PM.    Final    ECHOCARDIOGRAM COMPLETE Result Date: 05/16/2023    ECHOCARDIOGRAM REPORT   Patient Name:   Jared [name]. Date of Exam: 05/16/2023 Medical Rec #:  [id number]        Height:       70.0 in Accession #:    [id number]       Weight:       173.9 lb  Date of Birth:  Sep 12, 1940       BSA:          1.967 m Patient Age:    82 years         BP:           126/86 mmHg Patient Gender: M                HR:           78 bpm. Exam Location:  Inpatient Procedure: 2D Echo, Color Doppler and Cardiac Doppler Indications:   Stroke I63.9  History:       Patient has no prior history of Echocardiogram examinations.                Stroke.  Sonographer:   Tinnie Gosling RDCS Referring      [phone] Haeden Hudock D Brookie Wayment Phys: IMPRESSIONS  1. Left ventricular ejection fraction, by estimation, is 70 to 75%. The left ventricle has hyperdynamic function. The left ventricle has no regional wall motion abnormalities. Left ventricular diastolic parameters are consistent with Grade I diastolic dysfunction (impaired relaxation).  2. Right ventricular systolic function is normal. The right ventricular size is normal.  3. The mitral valve is normal in  structure. No evidence of mitral valve regurgitation. No evidence of mitral stenosis.  4. The aortic valve is tricuspid. There is moderate calcification of the aortic valve. Aortic valve regurgitation is not visualized. Aortic valve sclerosis/calcification is present, without any evidence of aortic stenosis.  5. Aortic dilatation noted. There is borderline dilatation of the aortic root, measuring 38 mm.  6. The inferior vena cava is normal in size with greater than 50% respiratory variability, suggesting right atrial pressure of 3 mmHg. FINDINGS  Left Ventricle: Left ventricular ejection fraction, by estimation, is 70 to 75%. The left ventricle has hyperdynamic function. The left ventricle has no regional wall motion abnormalities. The left ventricular internal cavity size was normal in size. There is no left ventricular hypertrophy. Left ventricular diastolic parameters are consistent with Grade I diastolic dysfunction (impaired relaxation). Right Ventricle: The right ventricular size is normal. No increase in right ventricular wall thickness. Right ventricular systolic function is normal. Left Atrium: Left atrial size was normal in size. Right Atrium: Right atrial size was normal in size. Pericardium: There is no evidence of pericardial effusion. Mitral Valve: The mitral valve is normal in structure. No evidence of mitral valve regurgitation. No evidence of mitral valve stenosis. Tricuspid Valve: The tricuspid valve is normal in structure. Tricuspid valve regurgitation is trivial. No evidence of tricuspid stenosis. Aortic Valve: The aortic valve is tricuspid. There is moderate calcification of the aortic valve. Aortic valve regurgitation is not visualized. Aortic valve sclerosis/calcification is present, without any evidence of aortic stenosis. Pulmonic Valve: The pulmonic valve was normal in structure. Pulmonic valve regurgitation is not visualized. No evidence of pulmonic stenosis. Aorta: Aortic dilatation  noted. There is borderline dilatation of the aortic root, measuring 38 mm. Venous: The inferior vena cava is normal in size with greater than 50% respiratory variability, suggesting right atrial pressure of 3 mmHg. IAS/Shunts: No atrial level shunt detected by color flow Doppler.  LEFT VENTRICLE PLAX 2D LVIDd:         4.50 cm   Diastology LVIDs:         3.00 cm   LV e' medial:    6.42 cm/s LV PW:         1.00 cm   LV E/e' medial:  7.5  LV IVS:        1.10 cm   LV e' lateral:   4.46 cm/s LVOT diam:     2.30 cm   LV E/e' lateral: 10.8 LV SV:         66 LV SV Index:   33 LVOT Area:     4.15 cm  RIGHT VENTRICLE             IVC RV S prime:     20.60 cm/s  IVC diam: 1.80 cm TAPSE (M-mode): 2.3 cm LEFT ATRIUM           Index LA diam:      3.50 cm 1.78 cm/m LA Vol (A4C): 41.1 ml 20.89 ml/m  AORTIC VALVE LVOT Vmax:   82.90 cm/s LVOT Vmean:  60.400 cm/s LVOT VTI:    0.158 m  AORTA Ao Root diam: 3.80 cm Ao Asc diam:  3.60 cm MITRAL VALVE MV Area (PHT): 2.97 cm    SHUNTS MV E velocity: 48.00 cm/s  Systemic VTI:  0.16 m MV A velocity: 78.00 cm/s  Systemic Diam: 2.30 cm MV E/A ratio:  0.62 Toribio Fuel MD Electronically signed by Toribio Fuel MD Signature Date/Time: 05/16/2023/11:02:12 AM    Final     Scheduled Meds:    aspirin  EC  81 mg Oral Daily   clopidogrel   75 mg Oral Daily   enoxaparin  (LOVENOX ) injection  40 mg Subcutaneous Q24H   rosuvastatin   20 mg Oral Daily    Continuous Infusions:     LOS: 0 days     Trenda Mar, MD,  FACP, [facility], [facility], [facility]   [facility] [place]      To contact the attending provider between 7A-7P or the covering provider during after hours 7P-7A, please log into the web site [email or web address] and access using universal [place] password for that web site. If you do not have the password, please call the hospital operator.  05/18/2023, 8:03 AM

## 2023-05-18 NOTE — Plan of Care (Signed)
 Calm, cooperative throughout the day, can communicate appropriately to doctors explanation. No complaint noted.  Problem: Education: Goal: Knowledge of disease or condition will improve Outcome: Progressing   Problem: Ischemic Stroke/TIA Tissue Perfusion: Goal: Complications of ischemic stroke/TIA will be minimized Outcome: Progressing   Problem: Safety: Goal: Ability to remain free from injury will improve Outcome: Progressing   Problem: Activity: Goal: Risk for activity intolerance will decrease Outcome: Progressing

## 2023-05-18 NOTE — Plan of Care (Signed)
  Problem: Education: Goal: Knowledge of disease or condition will improve Outcome: Progressing Goal: Knowledge of secondary prevention will improve (MUST DOCUMENT ALL) Outcome: Progressing Goal: Knowledge of patient specific risk factors will improve Loraine Leriche N/A or DELETE if not current risk factor) Outcome: Progressing   Problem: Ischemic Stroke/TIA Tissue Perfusion: Goal: Complications of ischemic stroke/TIA will be minimized Outcome: Progressing

## 2023-05-19 ENCOUNTER — Encounter (HOSPITAL_COMMUNITY)
Admission: EM | Disposition: A | Discharge status: Rehabilitation Facility | Payer: Self-pay | Source: Home / Self Care | Attending: Internal Medicine

## 2023-05-19 DIAGNOSIS — I639 Cerebral infarction, unspecified: Secondary | ICD-10-CM | POA: Diagnosis not present

## 2023-05-19 HISTORY — PX: LOOP RECORDER INSERTION: EP1214

## 2023-05-19 SURGERY — LOOP RECORDER INSERTION

## 2023-05-19 MED ORDER — LIDOCAINE-EPINEPHRINE 1 %-1:100000 IJ SOLN
INTRAMUSCULAR | Status: DC | PRN
  Administered 2023-05-19: 10 mL

## 2023-05-19 MED ORDER — LIDOCAINE-EPINEPHRINE 1 %-1:100000 IJ SOLN
INTRAMUSCULAR | Status: AC
  Filled 2023-05-19: qty 1

## 2023-05-19 SURGICAL SUPPLY — 2 items
MONITOR LUX-DX II+ M312 (Prosthesis & Implant Heart) IMPLANT
PACK LOOP INSERTION (CUSTOM PROCEDURE TRAY) ×1 IMPLANT

## 2023-05-19 NOTE — Progress Notes (Addendum)
 PROGRESS NOTE   Jared [name].  FMW:[id number]    DOB: 15-Mar-1941    DOA: 05/15/2023  PCP: Yolande Toribio MATSU, MD   I have briefly reviewed patients previous medical records in [facility].  Chief Complaint  Patient presents with   Altered Mental Status    Brief Hospital Course:  83 y.o. married male, chronic ambulatory dysfunction, uses a motorized scooter to transport in the house, with medical history significant for HTN, cognitive impairment, remote colon cancer with prior right hemicolectomy who presented to the ED for evaluation of abnormal speech (garbled speech and difficulty saying what he wanted to say) and increased confusion from baseline.  LKW 2100 on 05/14/2023.  Admitted for acute embolic strokes.  Neurology consulted.  They recommended ILR.  Neurology signed off 1/10.  Cardiology consulted for ILR.  Awaiting physiatry consultation for admission to CIR.  No new events or acute findings over the weekend.  Patient had loop recorder placed by EP Cardiology on 1/13 for monitoring for A-fib and cryptogenic stroke.  As per inpatient rehab coordinator, initially indicated that a peer to peer review was requested by insurance company with attending but it appears that patient now has insurance authorization for CIR and is awaiting bed.  Assessment & Plan:  Principal Problem:   Acute CVA (cerebrovascular accident) [facility]) Active Problems:   Hypertension   Acute ischemic stroke Suspect embolic etiology given bilaterality of stroke involvement. CT head without contrast 1/9: No acute intracranial abnormality.  Stable advanced atrophy and chronic microvascular ischemic changes MRI brain: Small acute or early subacute infarcts in the left parietal and right frontal and occipital lobes.  Advanced chronic microvascular ischemic change and cerebral atrophy. CTA head and neck: No intracranial LVO.  60% stenosis of the distal left CCA secondary to noncalcified plaque, with an area of focal  plaque ulceration.  Severe stenosis at takeoff of left vertebral artery from the aorta. A1c 6 and LDL 124 2D echo: LVEF 70-75% grade 1 diastolic dysfunction B/L LEV dopplers> no DVT PT OT and SLP recommend AIR, consult placed 1/10, input pending.  Delays. Neurology consulted and recommend aspirin  81 Mg daily + Plavix  75 Mg daily x 3 weeks and then aspirin  81 Mg alone thereafter.  Continue rosuvastatin  Stroke MD follow-up appreciated.  Suspect cardioembolic etiology for stroke.  Loop recorder placed by EP Cardiology on 1/13, to rule out A-fib. Outpatient follow-up with Dr. Chalice at [facility] in about 4 weeks.  Hyperlipidemia LDL 124, goal <70 Continue rosuvastatin  20 mg daily Follow-up LFTs periodically as outpatient  Essential hypertension Allow for permissive hypertension for 48 hours and treat BP only if >220/110  Cognitive impairment/suspected dementia Per neurology, outpatient follow-up with neurology for possible NPH workup B12, folate, TSH, ESR, RPR normal.  UA unremarkable. As per stroke MD, 2020 06-2021-no NPH found per Dr. Chalice Continue follow-up with Dr. Chalice and [facility] Neurologist as outpatient.  Left CCA stenosis: CT head and neck 60% stenosis in the distal left CCA secondary to plaque with ulceration Continue antiplatelets as above and statins. Needs outpatient follow-up with vascular surgery  Ambulatory dysfunction AIR recommended by therapies, being evaluated for same.  Chest x-ray abnormality Mild left basilar infiltrate versus atelectasis reported.  Follow-up chest x-ray in 4 to 6 weeks to ensure resolution.  This can be done outpatient via PCP. No clinical concern for pneumonia.  Aortic root dilatation measuring 38 mm Noted on echo 1/10. Outpatient follow-up.  Body mass index is 24.96 kg/m.   DVT  prophylaxis: enoxaparin  (LOVENOX ) injection 40 mg Start: 05/15/23 1800     Code Status: Full Code:  Family Communication: Spouse via phone. Disposition:   Medically stable for DC to CIR, awaiting CIR bed.     Consultants:   Neurology EP Cardiology  Procedures:   Loop recorder placement by EP Cardiology 1/13  Antimicrobials:      Subjective:  Patient seen this morning prior to procedure.  I am healthy.  No complaints reported.  Objective:   Vitals:   05/19/23 0416 05/19/23 0730 05/19/23 1138 05/19/23 1529  BP: 121/75 133/88 (!) 127/98 113/75  Pulse: (!) 59 62 61 68  Resp: 14 18 18 18   Temp: 98 F (36.7 C) 97.6 F (36.4 C) 97.8 F (36.6 C) 97.8 F (36.6 C)  TempSrc: Oral Oral Oral Oral  SpO2: 97% 96% 98% 98%  Weight:      Height:        General exam: Elderly male, moderately built and nourished sitting comfortably in bed getting ready to eat breakfast this morning. Respiratory system: Clear to auscultation. Respiratory effort normal. Cardiovascular system: S1 & S2 heard, RRR. No JVD, murmurs, rubs, gallops or clicks. No pedal edema. Gastrointestinal system: Abdomen is nondistended, soft and nontender. No organomegaly or masses felt. Normal bowel sounds heard. Central nervous system: Alert and oriented x 2. No focal neurological deficits.  Stable without change. Extremities: Symmetric 5 x 5 power. Skin: No rashes, lesions or ulcers Psychiatry: Judgement and insight impaired. Mood & affect pleasantly confused.     Data Reviewed:   I have personally reviewed following labs and imaging studies   CBC: Recent Labs  Lab 05/15/23 1117 05/16/23 0658  WBC 7.8 5.9  NEUTROABS 5.4  --   HGB 14.9 14.2  HCT 44.4 41.8  MCV 91.5 88.0  PLT 281 276    Basic Metabolic Panel: Recent Labs  Lab 05/15/23 1117 05/16/23 0658  NA 140 140  K 4.0 3.5  CL 109 106  CO2 24 25  GLUCOSE 121* 102*  BUN 13 11  CREATININE 0.73 0.72  CALCIUM  8.9 9.2  MG 2.1  --     Liver Function Tests: Recent Labs  Lab 05/15/23 1117  AST 16  ALT 13  ALKPHOS 84  BILITOT 0.8  PROT 6.8  ALBUMIN 3.5    CBG: Recent Labs  Lab  05/16/23 0046  GLUCAP 106*    Microbiology Studies:  No results found for this or any previous visit (from the past 240 hours).  Radiology Studies:  EP PPM/ICD IMPLANT Result Date: 05/19/2023 CONCLUSIONS:  1. Successful implantation of a implantable loop recorder for a history of cryptogenic stroke  2. No early apparent complications. Ozell Prentice Passey, PA-C Cardiac Electrophysiology    Scheduled Meds:    aspirin  EC  81 mg Oral Daily   clopidogrel   75 mg Oral Daily   enoxaparin  (LOVENOX ) injection  40 mg Subcutaneous Q24H   lidocaine -EPINEPHrine        rosuvastatin   20 mg Oral Daily    Continuous Infusions:     LOS: 0 days     Trenda Mar, MD,  FACP, [facility], [facility], [facility]   [facility] Ad[facility] [place]      To contact the attending provider between 7A-7P or the covering provider during after hours 7P-7A, please log into the web site [email or web address] and access using universal [place] password for that web site. If you do not have the password, please call the hospital operator.  05/19/2023,  4:20 PM

## 2023-05-19 NOTE — TOC CM/SW Note (Signed)
 Transition of Care [facility]) - Inpatient Brief Assessment   Patient Details  Name: Jared [name]. MRN: [id number] Date of Birth: [birth year]  [facility]) CM/SW Contact:    Andrez JULIANNA George, RN Phone Number: 05/19/2023, 11:04 AM   Clinical Narrative:  Awaiting CIR eval.   Transition of Care Asessment: Insurance and Status: Insurance coverage has been reviewed Patient has primary care physician: Yes Home environment has been reviewed: home with spouse   Prior/Current Home Services: Current home services ([facility]) Social Drivers of Health Review: SDOH reviewed no interventions necessary Readmission risk has been reviewed: Yes Transition of care needs: transition of care needs identified, TOC will continue to follow

## 2023-05-19 NOTE — Progress Notes (Signed)
 Returned from having Loop reorder placed.  Dressing intact with drainage marked.  Instructed to no raise left arm above head for a while.  Food came while he was away so he is eating at this time.  No signs of distress.

## 2023-05-19 NOTE — Progress Notes (Addendum)
  Patient Name: Jared [name]. Date of Encounter: 05/19/2023  Primary Cardiologist: None Electrophysiologist: New to Dr. Kennyth  Interval Summary   The patient is doing well today.  At this time, the patient denies chest pain, shortness of breath, or any new concerns.  Vital Signs    Vitals:   05/18/23 1947 05/19/23 0023 05/19/23 0416 05/19/23 0730  BP: 139/82 136/89 121/75 133/88  Pulse: 68 63 (!) 59 62  Resp: 18 16 14 18   Temp: 98.2 F (36.8 C) 97.7 F (36.5 C) 98 F (36.7 C) 97.6 F (36.4 C)  TempSrc: Oral Oral Oral Oral  SpO2: 95% 97% 97% 96%  Weight:      Height:        Intake/Output Summary (Last 24 hours) at 05/19/2023 0905 Last data filed at 05/18/2023 1856 Gross per 24 hour  Intake --  Output 1450 ml  Net -1450 ml   Filed Weights   05/15/23 1040 05/15/23 1040 05/16/23 0000  Weight: 75.8 kg 75.8 kg 78.9 kg    Physical Exam    GEN- The patient is well appearing, alert and oriented x 3 today.   Lungs- Clear to ausculation bilaterally, normal work of breathing Cardiac- Regular rate and rhythm, no murmurs, rubs or gallops GI- soft, NT, ND, + BS Extremities- no clubbing or cyanosis. No edema  Telemetry    NSR/sinus bradycardia  (personally reviewed)  Hospital Course    Ibraham Levi. is a 83 y.o. male admitted for acute stroke. 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   EP asked to see for recommendations regarding loop recorder and cardiac monitoring.   Assessment & Plan    Cryptogenic Stroke The patient presents with cryptogenic stroke.  The patient does not have a TEE. We have spoken spoke at length with the patient about monitoring for afib with an implantable loop recorder.   Risks, benefits, and alternatives to implantable loop recorder were discussed with the patient today.   The pt is agreeable to proceed with loop implant, so will likely plan today after  confirming with MD and confirming discharge disposition.   For questions or updates, please contact [facility] Please consult [email or web address] for contact info under Cardiology/STEMI.  Signed, Ozell Prentice Passey, PA-C  05/19/2023, 9:05 AM   I have seen, examined the patient, and reviewed the above assessment and plan.    Interval: NAOE. Doing well. Wants to pursue loop implant. SR with rare PVCs on telemetry.  GEN: No acute distress.   Cardiac: Normal rate, regular rhythm. 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    1. Cryptogenic Stroke The patient presents with cryptogenic stroke.  The patient does not have a TEE planned for this AM.  I spoke at length with the patient about monitoring for afib with an implantable loop recorder.  Risks, benefits, and alteratives to implantable loop recorder were discussed with the patient today and patient would like to proceed with loop implant this afternoon.      Fonda Kennyth, MD 05/19/2023 1:49 PM

## 2023-05-19 NOTE — Discharge Instructions (Signed)
Care After Your Loop Recorder  You have a Armed forces logistics/support/administrative officer your cardiac device site for redness, swelling, and drainage. Call the device clinic at [phone] if you experience these symptoms or fever/chills.  If you notice bleeding from your site, hold firm, but gently pressure with two fingers for 5 minutes. Dried blood on the steri-strips when removing the outer bandage is normal.   Keep the large square bandage on your site for 24 hours and then you may remove it yourself. Keep the steri-strips underneath in place.   You may shower after 72 hours / 3 days from your procedure with the steri-strips in place. They will usually fall off on their own, or may be removed after 10 days. Pat dry.   Avoid lotions, ointments, or perfumes over your incision until it is well-healed.  Please do not submerge in water until your site is completely healed.   Your device is MRI compatible.   Remote monitoring is used to monitor your cardiac device from home. This monitoring is scheduled every month by our office. It allows [place] to keep an eye on the function of your device to ensure it is working properly.

## 2023-05-19 NOTE — Progress Notes (Signed)
 Inpatient Rehab Admissions Coordinator:   I spoke with Pt.'s wife regarding potential CIR admit. She is interested and states that she is able to provide 24/7 support following CIR. I will open case with insurance and pursue for admission.  Leita Kleine, MS, CCC-SLP Rehab Admissions Coordinator  [phone] (celll) [phone] (office)

## 2023-05-19 NOTE — Plan of Care (Signed)

## 2023-05-19 NOTE — Progress Notes (Signed)
 Physical Therapy Treatment Patient Details Name: Jared [name]. MRN: [id number] DOB: June 07, 1940 Today's Date: 05/19/2023   History of Present Illness 83 y.o. male who presented to the ED for evaluation of abnormal speech and increased confusion. MRI brain without contrast showed small acute or early subacute infarcts in the left parietal and right frontal and occipital lobes; anticipate embolic etiology. PMHx: HTN, cognitive impairment, remote colon cancer with prior right hemicolectomy    PT Comments  Pt resting in bed on arrival an agreeable to session with continued progress towards acute goals. Pt able to progress gait distance with grossly min A to steady with focus on increased step amplitude bilaterally as well as weight shifting to R and improved posture. Pt continues to require consistent cues for step height and RLE advancement as pt with tendency to leave RLE behind but continue to advance RW. Pt performing large amplitude stepping exercise and standing marching for improved coordination LE strength. Current plan remains appropriate to address deficits and maximize functional independence and decrease caregiver burden. Pt continues to benefit from skilled PT services to progress toward functional mobility goals.     If plan is discharge home, recommend the following: A lot of help with walking and/or transfers;A little help with bathing/dressing/bathroom;Assistance with cooking/housework;Direct supervision/assist for medications management;Direct supervision/assist for financial management;Assist for transportation;Help with stairs or ramp for entrance;Supervision due to cognitive status   Can travel by private vehicle        Equipment Recommendations  None recommended by PT    Recommendations for Other Services       Precautions / Restrictions Precautions Precautions: Fall Restrictions Weight Bearing Restrictions Per Provider Order: No     Mobility  Bed Mobility Overal  bed mobility: Needs Assistance Bed Mobility: Supine to Sit, Sit to Supine     Supine to sit: Contact guard Sit to supine: Min assist   General bed mobility comments: light min A to return LEs to bed    Transfers Overall transfer level: Needs assistance Equipment used: Rolling walker (2 wheels) Transfers: Sit to/from Stand Sit to Stand: Contact guard assist           General transfer comment: cues for hand placement    Ambulation/Gait Ambulation/Gait assistance: Min assist Gait Distance (Feet): 80 Feet Assistive device: Rolling walker (2 wheels) Gait Pattern/deviations: Step-to pattern, Decreased stance time - right, Decreased step length - right, Decreased dorsiflexion - right, Decreased weight shift to right Gait velocity: variable     General Gait Details: minA for ambulation due to decreased coordination, pt able to show large amplitude hip and knee flexion with marching in place however with ambulation pt requires constant cuing for picking up his R foot and moving it forward, cues for upright trunk and forward gaze   Stairs             Wheelchair Mobility     Tilt Bed    Modified Rankin (Stroke Patients Only)       Balance Overall balance assessment: Needs assistance Sitting-balance support: Feet supported Sitting balance-Leahy Scale: Good     Standing balance support: Single extremity supported, During functional activity Standing balance-Leahy Scale: Fair                              Cognition Arousal: Alert Behavior During Therapy: WFL for tasks assessed/performed Overall Cognitive Status: Impaired/Different from baseline  General Comments: follows all simple 1 step commands        Exercises Other Exercises Other Exercises: large amplitude stepping forward/back with RLE, standing marching x20    General Comments General comments (skin integrity, edema, etc.): VSS on RA, spouse  present and supportive      Pertinent Vitals/Pain Pain Assessment Pain Assessment: Faces Faces Pain Scale: No hurt Pain Intervention(s): Monitored during session    Home Living                          Prior Function            PT Goals (current goals can now be found in the care plan section) Acute Rehab PT Goals PT Goal Formulation: With patient Time For Goal Achievement: 05/30/23 Progress towards PT goals: Progressing toward goals    Frequency    Min 1X/week      PT Plan      Co-evaluation              AM-PAC PT 6 Clicks Mobility   Outcome Measure  Help needed turning from your back to your side while in a flat bed without using bedrails?: A Little Help needed moving from lying on your back to sitting on the side of a flat bed without using bedrails?: A Little Help needed moving to and from a bed to a chair (including a wheelchair)?: A Little Help needed standing up from a chair using your arms (e.g., wheelchair or bedside chair)?: A Little Help needed to walk in hospital room?: A Little Help needed climbing 3-5 steps with a railing? : A Lot 6 Click Score: 17    End of Session Equipment Utilized During Treatment: Gait belt Activity Tolerance: Patient tolerated treatment well Patient left: in bed;with call bell/phone within reach;with family/visitor present Nurse Communication: Mobility status PT Visit Diagnosis: Unsteadiness on feet (R26.81);Other abnormalities of gait and mobility (R26.89);Repeated falls (R29.6);Muscle weakness (generalized) (M62.81);Difficulty in walking, not elsewhere classified (R26.2);Hemiplegia and hemiparesis Hemiplegia - Right/Left: Right Hemiplegia - caused by: Cerebral infarction     Time: 1557-1620 PT Time Calculation (min) (ACUTE ONLY): 23 min  Charges:    $Gait Training: 8-22 mins $Therapeutic Exercise: 8-22 mins PT General Charges $$ ACUTE PT VISIT: 1 Visit                     Kolby Myung R. PTA Acute  Rehabilitation Services Office: [phone]   Therisa CHRISTELLA Boor 05/19/2023, 4:31 PM

## 2023-05-19 NOTE — Plan of Care (Signed)
 Has to be reminded not to attempt to get out of bed without assist.  He is very unsteady on his feet but he thinks he can get up unassisted. [place] he is just trying to be helpful.  Is reminded that if he tries to get up unassisted, he could fall.    Problem: Education: Goal: Knowledge of secondary prevention will improve (MUST DOCUMENT ALL) Outcome: Progressing   Problem: Education: Goal: Knowledge of patient specific risk factors will improve Alonso N/A or DELETE if not current risk factor) Outcome: Progressing   Problem: Coping: Goal: Will identify appropriate support needs Outcome: Progressing   Problem: Self-Care: Goal: Ability to participate in self-care as condition permits will improve Outcome: Progressing   Problem: Nutrition: Goal: Risk of aspiration will decrease Outcome: Progressing   Problem: Nutrition: Goal: Dietary intake will improve Outcome: Progressing   Problem: Education: Goal: Knowledge of General Education information will improve Description: Including pain rating scale, medication(s)/side effects and non-pharmacologic comfort measures Outcome: Progressing   Problem: Activity: Goal: Risk for activity intolerance will decrease Outcome: Progressing   Problem: Coping: Goal: Level of anxiety will decrease Outcome: Progressing

## 2023-05-19 NOTE — Progress Notes (Addendum)
 Inpatient Rehab Admissions Coordinator:  Following pt for colleague, Leita. Insurance is offering a audiological scientist. Dr. Judeth is aware. Will continue to follow.  1320: Received insurance authorization. Awaiting bed availability.   Tinnie Yvone Cohens, MS, CCC-SLP Admissions Coordinator [phone]

## 2023-05-19 NOTE — Progress Notes (Signed)
 Occupational Therapy Treatment Patient Details Name: Jared [name]. MRN: [id number] DOB: 1940/06/30 Today's Date: 05/19/2023   History of present illness 83 y.o. male who presented to the ED for evaluation of abnormal speech and increased confusion. MRI brain without contrast showed small acute or early subacute infarcts in the left parietal and right frontal and occipital lobes; anticipate embolic etiology. PMHx: HTN, cognitive impairment, remote colon cancer with prior right hemicolectomy   OT comments  Pt is making fair progress towards their acute OT goals. Pt needed CGA to get to EOB and stand with RW, he ambulated to the sink with RW and min A for balance and cues. He continues to be limited by RLE incoordination, unsteady balance and shuffling gait with poor carryover of body mechanic cues. He tolerated unsupported standing at the sink during grooming with CGA and sequenced through grooming tasks WFL. Noted mild R inattention during hallway mobility with RW. OT to continue to follow acutely to facilitate progress towards established goals. Pt will continue to benefit from intensive inpatient follow up therapy, >3 hours/day after discharge.       If plan is discharge home, recommend the following:  A little help with walking and/or transfers;A lot of help with bathing/dressing/bathroom;Direct supervision/assist for medications management;Direct supervision/assist for financial management;Assist for transportation;Help with stairs or ramp for entrance;Supervision due to cognitive status;Assistance with cooking/housework   Equipment Recommendations  None recommended by OT    Recommendations for Other Services Rehab consult    Precautions / Restrictions Precautions Precautions: Fall Restrictions Weight Bearing Restrictions Per Provider Order: No       Mobility Bed Mobility Overal bed mobility: Needs Assistance Bed Mobility: Supine to Sit, Sit to Supine     Supine to sit:  Contact guard Sit to supine: Min assist        Transfers Overall transfer level: Needs assistance Equipment used: Rolling walker (2 wheels) Transfers: Sit to/from Stand Sit to Stand: Contact guard assist                 Balance Overall balance assessment: Needs assistance Sitting-balance support: Feet supported Sitting balance-Leahy Scale: Good     Standing balance support: Single extremity supported, During functional activity Standing balance-Leahy Scale: Fair                             ADL either performed or assessed with clinical judgement   ADL Overall ADL's : Needs assistance/impaired     Grooming: Contact guard assist;Standing;Oral care                   Toilet Transfer: Minimal assistance;Rolling walker (2 wheels) Toilet Transfer Details (indicate cue type and reason): min A for cues         Functional mobility during ADLs: Minimal assistance;Rolling walker (2 wheels) General ADL Comments: limited by RLE incoordination, shufflign gait, unsteady balance and poor carryover    Extremity/Trunk Assessment Upper Extremity Assessment Upper Extremity Assessment: Generalized weakness   Lower Extremity Assessment Lower Extremity Assessment: Defer to PT evaluation        Vision   Vision Assessment?: No apparent visual deficits   Perception Perception Perception: Not tested   Praxis Praxis Praxis: Not tested    Cognition Arousal: Alert Behavior During Therapy: [facility] for tasks assessed/performed Overall Cognitive Status: Impaired/Different from baseline  General Comments: follows all simple 1 step commands, limited carry over noted.        Exercises      Shoulder Instructions       General Comments VSS on RA    Pertinent Vitals/ Pain       Pain Assessment Pain Assessment: Faces Faces Pain Scale: No hurt Pain Intervention(s): Monitored during session  Home Living                                           Prior Functioning/Environment              Frequency  Min 1X/week        Progress Toward Goals  OT Goals(current goals can now be found in the care plan section)  Progress towards OT goals: Progressing toward goals  Acute Rehab OT Goals Patient Stated Goal: to get better OT Goal Formulation: With patient Time For Goal Achievement: 05/30/23 Potential to Achieve Goals: Good ADL Goals Pt Will Perform Grooming: with modified independence;standing Pt Will Perform Lower Body Dressing: with modified independence;sit to/from stand Pt Will Transfer to Toilet: with modified independence;ambulating Additional ADL Goal #1: Pt will navigate hospital environment with RW and mod I to demonstrate reduced risk for falls at discharge.  Plan      Co-evaluation                 AM-PAC OT 6 Clicks Daily Activity     Outcome Measure   Help from another person eating meals?: None Help from another person taking care of personal grooming?: A Little Help from another person toileting, which includes using toliet, bedpan, or urinal?: A Little Help from another person bathing (including washing, rinsing, drying)?: A Lot Help from another person to put on and taking off regular upper body clothing?: A Little Help from another person to put on and taking off regular lower body clothing?: A Lot 6 Click Score: 17    End of Session Equipment Utilized During Treatment: Rolling walker (2 wheels);Gait belt  OT Visit Diagnosis: Unsteadiness on feet (R26.81);Other abnormalities of gait and mobility (R26.89);Muscle weakness (generalized) (M62.81)   Activity Tolerance Patient tolerated treatment well   Patient Left in chair;with call bell/phone within reach;with chair alarm set   Nurse Communication Mobility status        Time: 8948-8887 OT Time Calculation (min): 21 min  Charges: OT General Charges $OT Visit: 1 Visit OT  Treatments $Self Care/Home Management : 8-22 mins  Lucie Kendall, OTR/L Acute Rehabilitation Services Office [phone] Secure Chat Communication Preferred   Lucie JONETTA Kendall 05/19/2023, 1:04 PM

## 2023-05-20 ENCOUNTER — Encounter (HOSPITAL_COMMUNITY): Payer: Self-pay | Admitting: Student

## 2023-05-20 DIAGNOSIS — I639 Cerebral infarction, unspecified: Secondary | ICD-10-CM | POA: Diagnosis not present

## 2023-05-20 MED ORDER — SENNOSIDES-DOCUSATE SODIUM 8.6-50 MG PO TABS
1.0000 | ORAL_TABLET | Freq: Two times a day (BID) | ORAL | Status: DC
  Administered 2023-05-20 – 2023-05-21 (×2): 1 via ORAL
  Filled 2023-05-20 (×2): qty 1

## 2023-05-20 MED ORDER — POLYETHYLENE GLYCOL 3350 17 G PO PACK
17.0000 g | PACK | Freq: Every day | ORAL | Status: DC
  Administered 2023-05-20 – 2023-05-21 (×2): 17 g via ORAL
  Filled 2023-05-20 (×2): qty 1

## 2023-05-20 MED ORDER — BISACODYL 5 MG PO TBEC: 5.0000 mg | DELAYED_RELEASE_TABLET | Freq: Every day | ORAL | Status: DC | PRN

## 2023-05-20 NOTE — Progress Notes (Signed)
 Physical Therapy Treatment Patient Details Name: Jared [name]. MRN: [id number] DOB: 03/23/1941 Today's Date: 05/20/2023   History of Present Illness 83 y.o. male who presented to the ED for evaluation of abnormal speech and increased confusion. MRI brain without contrast showed small acute or early subacute infarcts in the left parietal and right frontal and occipital lobes; anticipate embolic etiology. PMHx: HTN, cognitive impairment, remote colon cancer with prior right hemicolectomy    PT Comments  Pt resting in bed on arrival and agreeable to session with continued progress towards acute goals. Pt limited this session by increased fatigue needing increased cues and time for sequencing all mobility. Pt requiring min A to complete bed mobility and min A throughout gait for balance and shifting weight at hips over to LLE for RLE clearance during swing phase. Pt continues to need continues cues and/or hands on assist during gait to clear RLE and not leave it behind. Pt complete transfers to sit<>stand x2 during session with CGA for safety. Current plan remains appropriate to address deficits and maximize functional independence and decrease caregiver burden. Pt continues to benefit from skilled PT services to progress toward functional mobility goals.     If plan is discharge home, recommend the following: A lot of help with walking and/or transfers;A little help with bathing/dressing/bathroom;Assistance with cooking/housework;Direct supervision/assist for medications management;Direct supervision/assist for financial management;Assist for transportation;Help with stairs or ramp for entrance;Supervision due to cognitive status   Can travel by private vehicle        Equipment Recommendations  None recommended by PT    Recommendations for Other Services       Precautions / Restrictions Precautions Precautions: Fall Restrictions Weight Bearing Restrictions Per Provider Order: No      Mobility  Bed Mobility Overal bed mobility: Needs Assistance Bed Mobility: Supine to Sit, Sit to Supine     Supine to sit: Min assist Sit to supine: Min assist   General bed mobility comments: min A to scoot out to EOB with pt needing increased time to sequence, min A to return LEs to bed    Transfers Overall transfer level: Needs assistance Equipment used: Rolling walker (2 wheels) Transfers: Sit to/from Stand Sit to Stand: Contact guard assist           General transfer comment: cues for hand placement, good rise from low commode    Ambulation/Gait Ambulation/Gait assistance: Min assist Gait Distance (Feet): 85 Feet (+ 15') Assistive device: Rolling walker (2 wheels) Gait Pattern/deviations: Step-to pattern, Decreased stance time - right, Decreased step length - right, Decreased dorsiflexion - right, Decreased weight shift to right Gait velocity: variable     General Gait Details: minA for ambulation due to decreased coordination, asssit needed to shift weight to L for RLE clearance, pt contineus to leave RLE behind and needing PRN physcial asssit to to lift and swing RLE through, pt contineus to need consistent cues for coordination   Stairs             Wheelchair Mobility     Tilt Bed    Modified Rankin (Stroke Patients Only) Modified Rankin (Stroke Patients Only) Pre-Morbid Rankin Score: Moderately severe disability Modified Rankin: Moderately severe disability     Balance Overall balance assessment: Needs assistance Sitting-balance support: Feet supported Sitting balance-Leahy Scale: Good     Standing balance support: Single extremity supported, During functional activity Standing balance-Leahy Scale: Fair Standing balance comment: statically, dynamically he needs BUE supported for safety and balance  Cognition Arousal: Alert Behavior During Therapy: WFL for tasks assessed/performed Overall Cognitive  Status: Impaired/Different from baseline                                 General Comments: follows all simple 1 step commands with increased time, some increased time needed for processing        Exercises Other Exercises Other Exercises: standing marching x5 with RLE pre gait    General Comments General comments (skin integrity, edema, etc.): VSS on RA      Pertinent Vitals/Pain Pain Assessment Pain Assessment: Faces Faces Pain Scale: No hurt    Home Living                          Prior Function            PT Goals (current goals can now be found in the care plan section) Acute Rehab PT Goals PT Goal Formulation: With patient Time For Goal Achievement: 05/30/23 Progress towards PT goals: Progressing toward goals    Frequency    Min 1X/week      PT Plan      Co-evaluation              AM-PAC PT 6 Clicks Mobility   Outcome Measure  Help needed turning from your back to your side while in a flat bed without using bedrails?: A Little Help needed moving from lying on your back to sitting on the side of a flat bed without using bedrails?: A Little Help needed moving to and from a bed to a chair (including a wheelchair)?: A Little Help needed standing up from a chair using your arms (e.g., wheelchair or bedside chair)?: A Little Help needed to walk in hospital room?: A Little Help needed climbing 3-5 steps with a railing? : A Lot 6 Click Score: 17    End of Session Equipment Utilized During Treatment: Gait belt Activity Tolerance: Patient tolerated treatment well;Patient limited by fatigue Patient left: in bed;with call bell/phone within reach;with bed alarm set Nurse Communication: Mobility status PT Visit Diagnosis: Unsteadiness on feet (R26.81);Other abnormalities of gait and mobility (R26.89);Repeated falls (R29.6);Muscle weakness (generalized) (M62.81);Difficulty in walking, not elsewhere classified (R26.2);Hemiplegia and  hemiparesis Hemiplegia - Right/Left: Right Hemiplegia - caused by: Cerebral infarction     Time: 1740-1806 PT Time Calculation (min) (ACUTE ONLY): 26 min  Charges:    $Gait Training: 8-22 mins $Therapeutic Activity: 8-22 mins PT General Charges $$ ACUTE PT VISIT: 1 Visit                     Klaryssa Fauth R. PTA Acute Rehabilitation Services Office: [phone]   Therisa CHRISTELLA Boor 05/20/2023, 6:15 PM

## 2023-05-20 NOTE — Progress Notes (Signed)
 PROGRESS NOTE   [name]  FMW:[id number]    DOB: [birth year]    DOA: 05/15/2023  PCP: Yolande Toribio MATSU, MD   I have briefly reviewed patients previous medical records in [facility].  Chief Complaint  Patient presents with   Altered Mental Status    Brief Hospital Course:  83 y.o. married male, chronic ambulatory dysfunction, uses a motorized scooter to transport in the house, with medical history significant for HTN, cognitive impairment, remote colon cancer with prior right hemicolectomy who presented to the ED for evaluation of abnormal speech (garbled speech and difficulty saying what he wanted to say) and increased confusion from baseline.  LKW 2100 on 05/14/2023.  Admitted for acute embolic strokes.  Neurology consulted.  They recommended ILR.  Neurology signed off 1/10.  Cardiology consulted for ILR.  Awaiting physiatry consultation for admission to CIR.  No new events or acute findings over the weekend.  Patient had loop recorder placed by EP Cardiology on 1/13 for monitoring for A-fib and cryptogenic stroke.  As per inpatient rehab coordinator, initially indicated that a peer to peer review was requested by insurance company with attending but it appears that patient now has insurance authorization for CIR.  Remains medically stable for DC to CIR pending bed availability.  Assessment & Plan:  Principal Problem:   Acute CVA (cerebrovascular accident) [facility]) Active Problems:   Hypertension   Acute ischemic stroke Suspect embolic etiology given bilaterality of stroke involvement. CT head without contrast 1/9: No acute intracranial abnormality.  Stable advanced atrophy and chronic microvascular ischemic changes MRI brain: Small acute or early subacute infarcts in the left parietal and right frontal and occipital lobes.  Advanced chronic microvascular ischemic change and cerebral atrophy. CTA head and neck: No intracranial LVO.  60% stenosis of the distal left CCA secondary  to noncalcified plaque, with an area of focal plaque ulceration.  Severe stenosis at takeoff of left vertebral artery from the aorta. A1c 6 and LDL 124 2D echo: LVEF 70-75% grade 1 diastolic dysfunction B/L LEV dopplers> no DVT PT OT and SLP recommend AIR, consult placed 1/10, input pending.  Delays. Neurology consulted and recommend aspirin  81 Mg daily + Plavix  75 Mg daily x 3 weeks and then aspirin  81 Mg alone thereafter.  Continue rosuvastatin  Stroke MD follow-up appreciated.  Suspect cardioembolic etiology for stroke.  Loop recorder placed by EP Cardiology on 1/13, to rule out A-fib.  Will need outpatient follow-up with EP Cardiology at discharge Outpatient follow-up with Dr. Chalice at [facility] in about 4 weeks. Stable.  Hyperlipidemia LDL 124, goal <70 Continue rosuvastatin  20 mg daily Follow-up LFTs periodically as outpatient  Essential hypertension Completed window for permissive hypertension.  Blood pressure is controlled off of meds.  Cognitive impairment/suspected dementia Per neurology, outpatient follow-up with neurology for possible NPH workup B12, folate, TSH, ESR, RPR normal.  UA unremarkable. As per stroke MD, [id number] NPH found per Dr. Chalice Continue follow-up with Dr. Chalice and [facility] Neurologist as outpatient. Apart from Colace and as needed Naprosyn, was not on any prescription meds as outpatient.  Left CCA stenosis: CT head and neck 60% stenosis in the distal left CCA secondary to plaque with ulceration Continue antiplatelets as above and statins. Needs outpatient follow-up with vascular surgery  Ambulatory dysfunction AIR recommended by therapies, being evaluated for same.  Chest x-ray abnormality Mild left basilar infiltrate versus atelectasis reported.  Follow-up chest x-ray in 4 to 6 weeks to ensure resolution.  This can be  done outpatient via PCP. No clinical concern for pneumonia.  Aortic root dilatation measuring 38 mm Noted on echo  1/10. Outpatient follow-up.  Body mass index is 24.96 kg/m.   DVT prophylaxis: enoxaparin  (LOVENOX ) injection 40 mg Start: 05/15/23 1800     Code Status: Full Code:  Family Communication: Spouse via phone. Disposition:  Medically stable for DC to CIR, awaiting CIR bed.     Consultants:   Neurology EP Cardiology  Procedures:   Loop recorder placement by EP Cardiology 1/13  Antimicrobials:      Subjective:  I am perfectly healthy.  When advised him that he will be discharged as soon as the CIR bed is available, he mentioned that he had difficulty getting up off the bed by himself for walking.  Advised him that he would not be going home but going to CIR to get his strength back.  At baseline, pleasantly confused but not agitated.  Objective:   Vitals:   05/19/23 2352 05/20/23 0331 05/20/23 0736 05/20/23 1110  BP: (!) 120/95 (!) 142/85 130/80 114/80  Pulse: 66 70 60 64  Resp: 17 18 17 17   Temp: 97.8 F (36.6 C) 97.8 F (36.6 C) 97.7 F (36.5 C) (!) 97.4 F (36.3 C)  TempSrc: Oral  Oral Oral  SpO2: 97% 93% 95% 94%  Weight:      Height:        General exam: Elderly male, moderately built and nourished lying comfortably propped up in bed. Respiratory system: Clear to auscultation. Respiratory effort normal.  Left upper anterior chest ILR site without acute findings. Cardiovascular system: S1 & S2 heard, RRR. No JVD, murmurs, rubs, gallops or clicks. No pedal edema.  Telemetry personally reviewed: Sinus rhythm.  Has had no arrhythmias for the duration of his hospital stay.  Discontinued telemetry. Gastrointestinal system: Abdomen is nondistended, soft and nontender. No organomegaly or masses felt. Normal bowel sounds heard. Central nervous system: Alert and oriented x 2. No focal neurological deficits.  Stable without change. Extremities: Symmetric 5 x 5 power in all limbs. Skin: No rashes, lesions or ulcers Psychiatry: Judgement and insight impaired. Mood & affect  pleasantly confused.     Data Reviewed:   I have personally reviewed following labs and imaging studies   CBC: Recent Labs  Lab 05/15/23 1117 05/16/23 0658  WBC 7.8 5.9  NEUTROABS 5.4  --   HGB 14.9 14.2  HCT 44.4 41.8  MCV 91.5 88.0  PLT 281 276    Basic Metabolic Panel: Recent Labs  Lab 05/15/23 1117 05/16/23 0658  NA 140 140  K 4.0 3.5  CL 109 106  CO2 24 25  GLUCOSE 121* 102*  BUN 13 11  CREATININE 0.73 0.72  CALCIUM  8.9 9.2  MG 2.1  --     Liver Function Tests: Recent Labs  Lab 05/15/23 1117  AST 16  ALT 13  ALKPHOS 84  BILITOT 0.8  PROT 6.8  ALBUMIN 3.5    CBG: Recent Labs  Lab 05/16/23 0046  GLUCAP 106*    Microbiology Studies:  No results found for this or any previous visit (from the past 240 hours).  Radiology Studies:  EP PPM/ICD IMPLANT Result Date: 05/19/2023 CONCLUSIONS:  1. Successful implantation of a implantable loop recorder for a history of cryptogenic stroke  2. No early apparent complications. Ozell Prentice Passey, PA-C Cardiac Electrophysiology    Scheduled Meds:    aspirin  EC  81 mg Oral Daily   clopidogrel   75 mg Oral Daily  enoxaparin  (LOVENOX ) injection  40 mg Subcutaneous Q24H   rosuvastatin   20 mg Oral Daily    Continuous Infusions:     LOS: 0 days     Trenda Mar, MD,  FACP, [facility], [facility], [facility]   [facility] [place]      To contact the attending provider between 7A-7P or the covering provider during after hours 7P-7A, please log into the web site [email or web address] and access using universal [place] password for that web site. If you do not have the password, please call the hospital operator.  05/20/2023, 11:52 AM  /13

## 2023-05-20 NOTE — Progress Notes (Signed)
 Speech Language Pathology Treatment: Cognitive-Linquistic  Patient Details Name: Jared [name]. MRN: [id number] DOB: 03-03-1941 Today's Date: 05/20/2023 Time: 1206-1221 SLP Time Calculation (min) (ACUTE ONLY): 15 min  Assessment / Plan / Recommendation Clinical Impression  Pt seen for cognitive intervention without family at bedside. During initial assessment family described a year long  decline in cognition that is worse since the stroke. MD's note states per neurology, outpatient follow-up with neurology for possible NPH workup. Pt appears somewhat less confused today. His awareness of memory/cognitive impairments is decreased when asked about his memory. He states he would write information on the calendar although his wife is responsible for his appointments. SLP printed strategies to use with pt's with mild cognitive impairments and pt read and therapist provided examples. Sheets left on window ledge with note for family to review.  He was independently able to remember he had a stroke and problem solved using the call button for the nurse. He reported getting out of bed and alarm went off so now uses call button for assistance. He also demonstrated how to use volume/channel/mute buttons on tv without assistance. Pt used password to unlock his phone, showed therapist where he has been texting his wife to bring him various items. Pt also recalled that he may discharge from hospital and had difficulty initially recalling rehab but when asked to describe the place he was able to give a description and stated rehab. Improvements from initial assessment and may be close to functional for acute venue but will continue to see. Recommend inpatient rehab and agree with neurology work up for possible dementia.     HPI HPI: 83 y.o. male who presented to the ED for evaluation of abnormal speech and increased confusion. MRI brain without contrast showed small acute or early subacute infarcts in the left  parietal and right frontal and occipital lobes; anticipate embolic etiology. PMHx: HTN, cognitive impairment, remote colon cancer with prior right hemicolectomy      SLP Plan  Continue with current plan of care      Recommendations for follow up therapy are one component of a multi-disciplinary discharge planning process, led by the attending physician.  Recommendations may be updated based on patient status, additional functional criteria and insurance authorization.    Recommendations                   Rehab consult Oral care BID     Cognitive communication deficit (M58.158)     Continue with current plan of care     Dustin Olam Bull  05/20/2023, 12:38 PM

## 2023-05-20 NOTE — Progress Notes (Signed)
 Inpatient Rehab Admissions Coordinator:  Awaiting bed availability. Will continue to follow.   Wolfgang Phoenix, MS, CCC-SLP Admissions Coordinator [phone]

## 2023-05-20 NOTE — Plan of Care (Signed)
  Problem: Education: Goal: Knowledge of disease or condition will improve Outcome: Progressing   Problem: Education: Goal: Knowledge of secondary prevention will improve (MUST DOCUMENT ALL) Outcome: Progressing   Problem: Education: Goal: Knowledge of patient specific risk factors will improve Jared [name] N/A or DELETE if not current risk factor) Outcome: Progressing   Problem: Ischemic Stroke/TIA Tissue Perfusion: Goal: Complications of ischemic stroke/TIA will be minimized Outcome: Progressing   Problem: Health Behavior/Discharge Planning: Goal: Ability to manage health-related needs will improve Outcome: Progressing   Problem: Self-Care: Goal: Ability to participate in self-care as condition permits will improve Outcome: Progressing   Problem: Education: Goal: Knowledge of General Education information will improve Description: Including pain rating scale, medication(s)/side effects and non-pharmacologic comfort measures Outcome: Progressing   Problem: Nutrition: Goal: Adequate nutrition will be maintained Outcome: Progressing   Problem: Activity: Goal: Risk for activity intolerance will decrease Outcome: Progressing   Problem: Coping: Goal: Level of anxiety will decrease Outcome: Progressing   Problem: Safety: Goal: Ability to remain free from injury will improve Outcome: Progressing

## 2023-05-20 NOTE — Plan of Care (Signed)
 Cooperative an calm. No complaint noted. Assisted in bedside commode.   Problem: Education: Goal: Knowledge of disease or condition will improve Outcome: Progressing   Problem: Ischemic Stroke/TIA Tissue Perfusion: Goal: Complications of ischemic stroke/TIA will be minimized Outcome: Progressing   Problem: Safety: Goal: Ability to remain free from injury will improve Outcome: Progressing   Problem: Skin Integrity: Goal: Risk for impaired skin integrity will decrease Outcome: Progressing   Problem: Clinical Measurements: Goal: Cardiovascular complication will be avoided Outcome: Progressing

## 2023-05-21 ENCOUNTER — Inpatient Hospital Stay (HOSPITAL_COMMUNITY)
Admission: AD | Admit: 2023-05-21 | Discharge: 2023-06-03 | DRG: 057 | Disposition: A | Discharge status: Home Health | Payer: Medicare PPO | Source: Intra-hospital | Attending: Physical Medicine & Rehabilitation | Admitting: Physical Medicine & Rehabilitation

## 2023-05-21 ENCOUNTER — Encounter (HOSPITAL_COMMUNITY): Payer: Self-pay | Admitting: Physical Medicine & Rehabilitation

## 2023-05-21 ENCOUNTER — Other Ambulatory Visit: Payer: Self-pay

## 2023-05-21 DIAGNOSIS — G912 (Idiopathic) normal pressure hydrocephalus: Secondary | ICD-10-CM | POA: Diagnosis not present

## 2023-05-21 DIAGNOSIS — I69319 Unspecified symptoms and signs involving cognitive functions following cerebral infarction: Principal | ICD-10-CM

## 2023-05-21 DIAGNOSIS — G620 Drug-induced polyneuropathy: Secondary | ICD-10-CM | POA: Diagnosis present

## 2023-05-21 DIAGNOSIS — I771 Stricture of artery: Secondary | ICD-10-CM | POA: Diagnosis not present

## 2023-05-21 DIAGNOSIS — N3000 Acute cystitis without hematuria: Secondary | ICD-10-CM | POA: Diagnosis not present

## 2023-05-21 DIAGNOSIS — I6522 Occlusion and stenosis of left carotid artery: Secondary | ICD-10-CM

## 2023-05-21 DIAGNOSIS — I10 Essential (primary) hypertension: Secondary | ICD-10-CM | POA: Diagnosis present

## 2023-05-21 DIAGNOSIS — R410 Disorientation, unspecified: Secondary | ICD-10-CM | POA: Diagnosis present

## 2023-05-21 DIAGNOSIS — T451X5A Adverse effect of antineoplastic and immunosuppressive drugs, initial encounter: Secondary | ICD-10-CM | POA: Diagnosis present

## 2023-05-21 DIAGNOSIS — Z8249 Family history of ischemic heart disease and other diseases of the circulatory system: Secondary | ICD-10-CM

## 2023-05-21 DIAGNOSIS — Z87442 Personal history of urinary calculi: Secondary | ICD-10-CM | POA: Diagnosis not present

## 2023-05-21 DIAGNOSIS — I639 Cerebral infarction, unspecified: Principal | ICD-10-CM | POA: Diagnosis present

## 2023-05-21 DIAGNOSIS — Z9841 Cataract extraction status, right eye: Secondary | ICD-10-CM | POA: Diagnosis not present

## 2023-05-21 DIAGNOSIS — D72823 Leukemoid reaction: Secondary | ICD-10-CM | POA: Diagnosis not present

## 2023-05-21 DIAGNOSIS — I69393 Ataxia following cerebral infarction: Secondary | ICD-10-CM

## 2023-05-21 DIAGNOSIS — E785 Hyperlipidemia, unspecified: Secondary | ICD-10-CM | POA: Diagnosis not present

## 2023-05-21 DIAGNOSIS — Z993 Dependence on wheelchair: Secondary | ICD-10-CM

## 2023-05-21 DIAGNOSIS — R509 Fever, unspecified: Secondary | ICD-10-CM | POA: Diagnosis not present

## 2023-05-21 DIAGNOSIS — R4701 Aphasia: Secondary | ICD-10-CM | POA: Diagnosis present

## 2023-05-21 DIAGNOSIS — B9620 Unspecified Escherichia coli [E. coli] as the cause of diseases classified elsewhere: Secondary | ICD-10-CM | POA: Diagnosis not present

## 2023-05-21 DIAGNOSIS — Z96612 Presence of left artificial shoulder joint: Secondary | ICD-10-CM | POA: Diagnosis present

## 2023-05-21 DIAGNOSIS — I77810 Thoracic aortic ectasia: Secondary | ICD-10-CM | POA: Diagnosis present

## 2023-05-21 DIAGNOSIS — Z79899 Other long term (current) drug therapy: Secondary | ICD-10-CM | POA: Diagnosis not present

## 2023-05-21 DIAGNOSIS — I69354 Hemiplegia and hemiparesis following cerebral infarction affecting left non-dominant side: Secondary | ICD-10-CM

## 2023-05-21 DIAGNOSIS — R7303 Prediabetes: Secondary | ICD-10-CM | POA: Diagnosis present

## 2023-05-21 DIAGNOSIS — I69320 Aphasia following cerebral infarction: Secondary | ICD-10-CM

## 2023-05-21 DIAGNOSIS — E440 Moderate protein-calorie malnutrition: Secondary | ICD-10-CM | POA: Diagnosis not present

## 2023-05-21 DIAGNOSIS — N390 Urinary tract infection, site not specified: Secondary | ICD-10-CM | POA: Diagnosis not present

## 2023-05-21 DIAGNOSIS — R262 Difficulty in walking, not elsewhere classified: Secondary | ICD-10-CM | POA: Diagnosis present

## 2023-05-21 DIAGNOSIS — Z9842 Cataract extraction status, left eye: Secondary | ICD-10-CM | POA: Diagnosis not present

## 2023-05-21 DIAGNOSIS — R4189 Other symptoms and signs involving cognitive functions and awareness: Secondary | ICD-10-CM | POA: Diagnosis present

## 2023-05-21 DIAGNOSIS — Z85038 Personal history of other malignant neoplasm of large intestine: Secondary | ICD-10-CM

## 2023-05-21 DIAGNOSIS — Z807 Family history of other malignant neoplasms of lymphoid, hematopoietic and related tissues: Secondary | ICD-10-CM | POA: Diagnosis not present

## 2023-05-21 DIAGNOSIS — F0390 Unspecified dementia without behavioral disturbance: Secondary | ICD-10-CM | POA: Diagnosis present

## 2023-05-21 DIAGNOSIS — J90 Pleural effusion, not elsewhere classified: Secondary | ICD-10-CM | POA: Diagnosis present

## 2023-05-21 DIAGNOSIS — Z9049 Acquired absence of other specified parts of digestive tract: Secondary | ICD-10-CM | POA: Diagnosis not present

## 2023-05-21 DIAGNOSIS — I6340 Cerebral infarction due to embolism of unspecified cerebral artery: Secondary | ICD-10-CM | POA: Diagnosis present

## 2023-05-21 DIAGNOSIS — J9811 Atelectasis: Secondary | ICD-10-CM | POA: Diagnosis present

## 2023-05-21 MED ORDER — CLOPIDOGREL BISULFATE 75 MG PO TABS: 75.0000 mg | ORAL_TABLET | Freq: Every day | ORAL | 0 refills | Status: DC

## 2023-05-21 MED ORDER — ENOXAPARIN SODIUM 40 MG/0.4ML IJ SOSY: 40.0000 mg | PREFILLED_SYRINGE | INTRAMUSCULAR | Status: DC

## 2023-05-21 MED ORDER — CLOPIDOGREL BISULFATE 75 MG PO TABS
75.0000 mg | ORAL_TABLET | Freq: Every day | ORAL | Status: DC
  Administered 2023-05-22 – 2023-06-03 (×13): 75 mg via ORAL
  Filled 2023-05-21 (×13): qty 1

## 2023-05-21 MED ORDER — SENNOSIDES-DOCUSATE SODIUM 8.6-50 MG PO TABS
1.0000 | ORAL_TABLET | Freq: Two times a day (BID) | ORAL | Status: DC
  Administered 2023-05-21 – 2023-06-03 (×25): 1 via ORAL
  Filled 2023-05-21 (×24): qty 1

## 2023-05-21 MED ORDER — ACETAMINOPHEN 160 MG/5ML PO SOLN: 650.0000 mg | ORAL | Status: DC | PRN

## 2023-05-21 MED ORDER — ASPIRIN 81 MG PO TBEC
81.0000 mg | DELAYED_RELEASE_TABLET | Freq: Every day | ORAL | Status: DC
  Administered 2023-05-22 – 2023-06-03 (×13): 81 mg via ORAL
  Filled 2023-05-21 (×13): qty 1

## 2023-05-21 MED ORDER — ACETAMINOPHEN 650 MG RE SUPP: 650.0000 mg | RECTAL | Status: DC | PRN

## 2023-05-21 MED ORDER — ROSUVASTATIN CALCIUM 20 MG PO TABS
20.0000 mg | ORAL_TABLET | Freq: Every day | ORAL | Status: DC
  Administered 2023-05-22 – 2023-06-03 (×13): 20 mg via ORAL
  Filled 2023-05-21 (×13): qty 1

## 2023-05-21 MED ORDER — ENOXAPARIN SODIUM 40 MG/0.4ML IJ SOSY
40.0000 mg | PREFILLED_SYRINGE | INTRAMUSCULAR | Status: DC
  Administered 2023-05-21 – 2023-06-02 (×13): 40 mg via SUBCUTANEOUS
  Filled 2023-05-21 (×13): qty 0.4

## 2023-05-21 MED ORDER — BISACODYL 5 MG PO TBEC: 5.0000 mg | DELAYED_RELEASE_TABLET | Freq: Every day | ORAL | Status: DC | PRN

## 2023-05-21 MED ORDER — ASPIRIN 81 MG PO TBEC: 81.0000 mg | DELAYED_RELEASE_TABLET | Freq: Every day | ORAL | 0 refills | Status: AC

## 2023-05-21 MED ORDER — POLYETHYLENE GLYCOL 3350 17 G PO PACK
17.0000 g | PACK | Freq: Every day | ORAL | Status: DC
  Administered 2023-05-22 – 2023-06-03 (×13): 17 g via ORAL
  Filled 2023-05-21 (×13): qty 1

## 2023-05-21 MED ORDER — ROSUVASTATIN CALCIUM 20 MG PO TABS: 20.0000 mg | ORAL_TABLET | Freq: Every day | ORAL | 0 refills | Status: DC

## 2023-05-21 MED ORDER — ACETAMINOPHEN 325 MG PO TABS
650.0000 mg | ORAL_TABLET | ORAL | Status: DC | PRN
  Administered 2023-05-22 – 2023-05-24 (×6): 650 mg via ORAL
  Filled 2023-05-21 (×6): qty 2

## 2023-05-21 NOTE — Progress Notes (Signed)
 Inpatient Rehab Admissions Coordinator:    I have a CIR bed for this Pt. RN may call report to [phone].   Pt. To admit to CIR for estimated 7-10 days with the goal of reaching supervision level and returning home with his wife.   Wandalee Gust, MS, CCC-SLP Rehab Admissions Coordinator  [phone] (celll) [phone] (office)

## 2023-05-21 NOTE — Plan of Care (Signed)
  Problem: Education: Goal: Knowledge of disease or condition will improve Outcome: Progressing   Problem: Ischemic Stroke/TIA Tissue Perfusion: Goal: Complications of ischemic stroke/TIA will be minimized Outcome: Progressing   Problem: Health Behavior/Discharge Planning: Goal: Ability to manage health-related needs will improve Outcome: Progressing   Problem: Self-Care: Goal: Ability to participate in self-care as condition permits will improve Outcome: Progressing   Problem: Education: Goal: Knowledge of General Education information will improve Description: Including pain rating scale, medication(s)/side effects and non-pharmacologic comfort measures Outcome: Progressing   Problem: Nutrition: Goal: Adequate nutrition will be maintained Outcome: Progressing   Problem: Coping: Goal: Level of anxiety will decrease Outcome: Progressing   Problem: Elimination: Goal: Will not experience complications related to bowel motility Outcome: Progressing Goal: Will not experience complications related to urinary retention Outcome: Progressing   Problem: Pain Management: Goal: General experience of comfort will improve Outcome: Progressing   Problem: Skin Integrity: Goal: Risk for impaired skin integrity will decrease Outcome: Progressing   Problem: Safety: Goal: Ability to remain free from injury will improve Outcome: Progressing

## 2023-05-21 NOTE — H&P (Signed)
 Physical Medicine and Rehabilitation Admission H&P    Chief Complaint  Patient presents with   Altered Mental Status  : HPI: Jared [name]. 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  Per chart review patient lives with spouse.  Multi level home 4 steps to entry.  Bed and bath on main level.  Uses an electric wheelchair for mobility in the home, furniture service and bathroom.  Rollator to get into the car.  Has a home health aide 2 times a week.  Presented 05/15/2023 with increasing confusion with garbled speech.  CT/MRI showed small acute/early subacute infarcts in the left parietal and right frontal occipital lobes.  CTA with no intracranial large vessel occlusion or significant stenosis.  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  Patient recently had an outpatient MRI at the [facility] (04/14/2019 24th) for ataxia and gait abnormality that showed no acute abnormality at that time concern for possible NPH with prior lacunar infarct.  Underwent an LP to further evaluate for NPH.  Opening pressure was not in chart review, labs unremarkable.  Patient had a neurology visit June 2024 with Dr. Albertina Hugger for evaluation of gait difficulty.  Appointment notes states that neuropathy started while on chemotherapy in 2019 walking difficulties in 2019 began with some falls in 2021.  Admission chemistries on this latest admission unremarkable.  Echocardiogram with ejection fraction of 70 to 75%.  Grade 1 diastolic dysfunction.  Neurology follow-up currently maintained on aspirin  81 mg daily and Plavix  75 mg daily for CVA prophylaxis x 3 weeks then aspirin  alone.  Lovenox  added for DVT prophylaxis.  CVAs felt to be likely cardioembolic. Patient did undergo loop recorder  placement 1/13 for monitoring for A-fib per Dr. Ardeen Kohler.  In regards to left CCA stenosis noted on CTA during workup of CVA patient would receive outpatient follow-up with vascular surgery.  Tolerating a regular consistency diet.  Therapy evaluations completed due to patient decreased functional mobility was admitted for a comprehensive rehab program.  Reports he lives in 3 story home but can stay on 1st floor. 4-5 steps to ender. Wife can assist 24/7 after discharge.   Review of Systems  Constitutional:  Negative for chills and fever.  HENT:  Negative for congestion and hearing loss.   Eyes:  Negative for blurred vision and double vision.  Respiratory:  Negative for cough, shortness of breath and wheezing.   Cardiovascular:  Negative for chest pain, palpitations and leg swelling.  Gastrointestinal:  Negative for abdominal pain, constipation, heartburn, nausea and vomiting.  Genitourinary:  Positive for urgency. Negative for dysuria, flank pain and hematuria.  Musculoskeletal:  Positive for falls, joint pain and myalgias.  Skin:  Negative for rash.  Neurological:  Positive for speech change and weakness. Negative for sensory change and headaches.  All other systems reviewed and are negative.  Past Medical History:  Diagnosis Date   colon cancer    History of kidney stones    Hypertension    Patient denies   Neuropathy due to chemotherapeutic drug [facility])    Neurologist says doesnt have it   Pre-diabetes    Past Surgical History:  Procedure Laterality Date   CATARACT EXTRACTION, BILATERAL     COLON SURGERY  2004   R hemicolectomy - removed due to cancer   COLONOSCOPY  HEMICOLECTOMY     HERNIA REPAIR  2005   LOOP RECORDER INSERTION N/A 05/19/2023   Procedure: LOOP RECORDER INSERTION;  Surgeon: Tylene Galla, PA-C;  Location: [facility] INVASIVE CV LAB;  Service: Cardiovascular;  Laterality: N/A;   REVERSE SHOULDER ARTHROPLASTY Left 11/29/2020   Procedure: REVERSE SHOULDER  ARTHROPLASTY;  Surgeon: Micheline Ahr, MD;  Location: [facility] ORS;  Service: Orthopedics;  Laterality: Left;   Family History  Problem Relation Age of Onset   Other Mother        old age   Hodgkin's lymphoma Mother    Heart failure Father    Social History:  reports that he has never smoked. He has never used smokeless tobacco. He reports current alcohol  use of about 1.0 standard drink of alcohol  per week. He reports that he does not use drugs. Allergies: No Known Allergies Medications Prior to Admission  Medication Sig Dispense Refill   Docusate Calcium  (STOOL SOFTENER PO) Take 1 tablet by mouth daily.     naproxen sodium (ALEVE) 220 MG tablet Take 220 mg by mouth daily as needed (pain).        Home: Home Living Family/patient expects to be discharged to:: Private residence Living Arrangements: Spouse/significant other Available Help at Discharge: Family, Available 24 hours/day Type of Home: House Home Access: Stairs to enter [employer] of Steps: 4 Entrance Stairs-Rails: Left Home Layout: One level, Multi-level, Able to live on main level with bedroom/bathroom Bathroom Shower/Tub: Health visitor: Handicapped height Bathroom Accessibility: Yes Home Equipment: Agricultural consultant (2 wheels), Rollator (4 wheels), Grab bars - tub/shower, Hand held shower head, Shower seat - built in, Art gallery manager  Lives With: Spouse   Functional History: Prior Function Prior Level of Function : Independent/Modified Independent Mobility Comments: uses Art gallery manager for mobility in home, furniture surfs in bathroom, and when not using scooter. Rollator to get to car, wife drives. no falls in several years. ADLs Comments: bathes himself, with setup, HHAide 2x week, dresses himself  Functional Status:  Mobility: Bed Mobility Overal bed mobility: Needs Assistance Bed Mobility: Supine to Sit, Sit to Supine Supine to sit: Min assist Sit to supine: Min assist General bed  mobility comments: min A to scoot out to EOB with pt needing increased time to sequence, min A to return LEs to bed Transfers Overall transfer level: Needs assistance Equipment used: Rolling walker (2 wheels) Transfers: Sit to/from Stand Sit to Stand: Contact guard assist General transfer comment: cues for hand placement, good rise from low commode Ambulation/Gait Ambulation/Gait assistance: Min assist Gait Distance (Feet): 85 Feet (+ 15') Assistive device: Rolling walker (2 wheels) Gait Pattern/deviations: Step-to pattern, Decreased stance time - right, Decreased step length - right, Decreased dorsiflexion - right, Decreased weight shift to right General Gait Details: minA for ambulation due to decreased coordination, asssit needed to shift weight to L for RLE clearance, pt contineus to leave RLE behind and needing PRN physcial asssit to to lift and swing RLE through, pt contineus to need consistent cues for coordination Gait velocity: variable Gait velocity interpretation: <1.8 ft/sec, indicate of risk for recurrent falls Pre-gait activities: marching in place    ADL: ADL Overall ADL's : Needs assistance/impaired Eating/Feeding: Independent Grooming: Contact guard assist, Standing, Oral care Upper Body Bathing: Set up, Supervision/ safety, Sitting Lower Body Bathing: Moderate assistance, Sit to/from stand Upper Body Dressing : Set up, Sitting Lower Body Dressing: Moderate assistance, Sit to/from stand Toilet Transfer: Minimal assistance, Rolling walker (2 wheels) Toilet Transfer Details (indicate  cue type and reason): min A for cues Toileting- Clothing Manipulation and Hygiene: Contact guard assist, Sitting/lateral lean, Sit to/from stand Functional mobility during ADLs: Minimal assistance, Rolling walker (2 wheels) General ADL Comments: limited by RLE incoordination, shufflign gait, unsteady balance and poor carryover  Cognition: Cognition Overall Cognitive Status:  Impaired/Different from baseline Arousal/Alertness: Awake/alert Orientation Level: Disoriented to time, Disoriented to situation Year: 2025 Day of Week: Correct Attention: Sustained Sustained Attention: Appears intact Memory: Impaired Memory Impairment: Retrieval deficit (3/5 word recall) Awareness: Impaired Awareness Impairment: Intellectual impairment, Emergent impairment, Anticipatory impairment Problem Solving: Impaired Problem Solving Impairment: Functional basic Safety/Judgment: Impaired Cognition Arousal: Alert Behavior During Therapy: WFL for tasks assessed/performed Overall Cognitive Status: Impaired/Different from baseline General Comments: follows all simple 1 step commands with increased time, some increased time needed for processing  Physical Exam: Blood pressure (!) 143/86, pulse 86, temperature 98.7 F (37.1 C), temperature source Oral, resp. rate 20, height 5\' 10"  (1.778 m), weight 78.9 kg, SpO2 96%.   General: No apparent distress, eating pasta with his hands, food noted on his chest  HEENT: Head is normocephalic, atraumatic, sclera anicteric, oral mucosa pink and moist Neck: Supple without JVD or lymphadenopathy Heart: Reg rate and rhythm. No murmurs rubs or gallops Chest: CTA bilaterally without wheezes, rales, or rhonchi; no distress Abdomen: Soft, non-tender, non-distended, bowel sounds positive. Extremities: No clubbing, cyanosis, or edema. Pulses are 2+ Psych: Pt's affect is appropriate. Pt is cooperative Skin: Clean and intact without signs of breakdown GU: External catheter draining yellow urine Neuro:    Mental Status: AAOx3, memory fair,  Intermittent delayed speech. Limited historian Speech/Languate: Naming and repetition intact, fluent, follows simple commands- has more difficulty with multistep commands, a little impulsive at times CRANIAL NERVES: II: PERRL III, IV, VI: EOM intact V: normal sensation bilaterally VII: no asymmetry VIII: normal  hearing to speech IX, X: normal palatal elevation XI: 5/5 head turn and 5/5 shoulder shrug bilaterally XII: Tongue midline   MOTOR: RUE: 5/5 Deltoid, 5/5 Biceps, 5/5 Triceps,5/5 Grip LUE: 5/5 Deltoid, 5/5 Biceps, 5/5 Triceps, 5/5 Grip RLE: HF 4+/5, KE 5/5, ADF 5/5, APF 5/5 LLE: HF 4+/5, KE 5/5, ADF 5/5, APF 5/5   REFLEXES:  No clonus  SENSORY: Intact to LT all 4 extremities  Coordination: Normal finger to nose and heel to shin, no tremor, no dysmetria     No results found for this or any previous visit (from the past 48 hours). EP PPM/ICD IMPLANT Result Date: 05/19/2023 CONCLUSIONS:  1. Successful implantation of a implantable loop recorder for a history of cryptogenic stroke  2. No early apparent complications. Tylene Galla, PA-C Cardiac Electrophysiology      Blood pressure (!) 143/86, pulse 86, temperature 98.7 F (37.1 C), temperature source Oral, resp. rate 20, height 5\' 10"  (1.778 m), weight 78.9 kg, SpO2 96%.  Medical Problem List and Plan: 1. 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  Status post loop recorder placement 05/19/2023  -patient may  shower  -ELOS/Goals: 7-10 Sup PT/OT/SLP  -Admit to CIR 2.  Antithrombotics: -DVT/anticoagulation:  Pharmaceutical: Lovenox .  Venous Doppler studies negative  -antiplatelet therapy: Aspirin  81 mg daily and Plavix  75 mg day x 3 weeks then aspirin  alone 3. Pain Management: Tylenol  as needed 4. Mood/Behavior/Sleep: Provide emotional support  -antipsychotic agents: N/A 5. Neuropsych/cognition: This patient may be capable of making decisions on his own behalf. 6. Skin/Wound Care: Routine skin checks 7. Fluids/Electrolytes/Nutrition: Routine  in and outs with follow-up chemistries 8.  Hyperlipidemia.  Crestor  9.  Previous NPH workup/cognitive impairment.  Followed outpatient by neurology service Dr. Albertina Hugger.  B12, TSH, ESR, RPR  normal. -Continue f/u with Dr. Albertina Hugger and [facility] neurology as outpatient -Uses Wheelchair/Scooter at home 10.  Left CCA stenosis.  CTA head and neck 60% stenosis distal left CCA secondary to plaque with ulceration. -Outpatient f/u with vascular surgery 11.  Hypertension.  Long term goal normotensive. Monitor with increased mobility.  Patient on no antihypertensive medications prior to admission. 12.  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  Follow-up outpatient    Sterling Eisenmenger, PA-C 05/21/2023

## 2023-05-21 NOTE — Progress Notes (Signed)
 PMR Admission Coordinator Pre-Admission Assessment   Patient: Jared [name]. is an 83 y.o., male MRN: [id number] DOB: 1940-06-19 Height: 5\' 10"  (177.8 cm) Weight: 78.9 kg   Insurance Information HMO:     PPO:      PCP:      IPA:      80/20:      OTHER:  PRIMARY:  [employer]      Policy#: [id number]       Subscriber: pt CM Name: Milton Alpers      Phone#: [phone] ext [phone]     Fax#: [phone] Pre-Cert#: [id number]  Received insurance authorization from [place]. Pt approved from 05/19/23-05/26/23. Updates due on 05/26/23.      Employer:  Benefits:  Phone #: online at [email or web address]     Name:  Eff. Date: 05/07/23     Deduct: does not have one      Out of Pocket Max: does not have one      Life Max:  CIR: $395/day co-pay with max co-pay of $1,975/admission      SNF: 100% coverage for days 1-20, $214/day co-pay for days 21-100 Outpatient: $25 co-pay/visit     Co-Pay:  Home Health: 100% coverage      Co-Pay:  DME: 80% coverage     Co-Pay: 20%  co-insurance Providers: in-network SECONDARY: Tricare      Policy#:  [id number]         Phone#:    Financial Counselor:       Phone#:    The "Data Collection Information Summary" for patients in Inpatient Rehabilitation Facilities with attached "Privacy Act Statement-Health Care Records" was provided and verbally reviewed with: Patient   Emergency Contact Information Contact Information       Name Relation Home Work Mobile    Casseus,[name] [phone]   [phone]         Other Contacts       Name Relation Home Work Mobile    Chism,[name]     [phone]           Current Medical History  Patient Admitting Diagnosis: CVA History of Present Illness: Jared [name]. 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  Per chart review patient lives with spouse.  Multi level home 4 steps to entry.  Bed and bath on main level.  Uses an electric wheelchair for mobility in the home, furniture service and bathroom.  Rollator to get into the car.  Has a home health aide 2 times a week.  Presented 05/15/2023 with increasing confusion with garbled speech.  CT/MRI showed small acute/early subacute infarcts in the left parietal and right frontal occipital lobes.  CTA with no intracranial large vessel occlusion or significant stenosis.  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  Patient recently had an outpatient MRI at the [facility] (04/14/2019 24th) for ataxia and gait abnormality that showed no acute abnormality at that time concern for possible NPH with prior lacunar infarct.  Underwent an LP to further evaluate for NPH.  Opening pressure was not in chart review, labs unremarkable.  Patient had a neurology visit June 2024 with Dr. Albertina Hugger for evaluation of gait difficulty.  Appointment notes states that neuropathy started while on chemotherapy in 2019 walking difficulties in 2019 began with some falls in 2021.  Admission chemistries on this latest  admission unremarkable.  Echocardiogram with ejection fraction of 70 to 75%.  Grade 1 diastolic dysfunction.  Neurology follow-up currently maintained on aspirin  81 mg daily and Plavix  75 mg daily for CVA prophylaxis x 3 weeks then aspirin  alone.  Lovenox  added for DVT prophylaxis.  Patient did undergo loop recorder placement 1/13 for monitoring for A-fib per Dr. Ardeen Kohler.  In regards to left CCA stenosis noted on CTA during workup of CVA patient would receive outpatient follow-up with vascular surgery.  Tolerating a regular consistency diet.  Therapy evaluations completed due to patient decreased functional mobility was admitted for a comprehensive rehab program.    Complete NIHSS TOTAL: 1   Patient's medical record from [facility]  has been reviewed by the rehabilitation admission coordinator and physician.   Past Medical History      Past Medical History:  Diagnosis Date   colon cancer     History of kidney stones     Hypertension      Patient denies   Neuropathy due to chemotherapeutic drug [facility])      Neurologist says doesnt have it   Pre-diabetes            Has the patient had major surgery during 100 days prior to admission? No   Family History   family history includes Heart failure in his father; Hodgkin's lymphoma in his mother; Other in his mother.   Current Medications  Current Medications    Current Facility-Administered Medications:    acetaminophen  (TYLENOL ) tablet 650 mg, 650 mg, Oral, Q4H PRN **OR** acetaminophen  (TYLENOL ) 160 MG/5ML solution 650 mg, 650 mg, Per Tube, Q4H PRN **OR** acetaminophen  (TYLENOL ) suppository 650 mg, 650 mg, Rectal, Q4H PRN, Kenny Peals, MD   aspirin  EC tablet 81 mg, 81 mg, Oral, Daily, Amelie Baize, Erin C, NP, 81 mg at 05/17/23 8295   clopidogrel  (PLAVIX ) tablet 75 mg, 75 mg, Oral, Daily, Lehner, Erin C, NP, 75 mg at 05/17/23 0930   enoxaparin  (LOVENOX ) injection 40 mg, 40 mg, Subcutaneous, Q24H, Patel, Vishal R, MD, 40 mg at 05/16/23 1820   hydrALAZINE  (APRESOLINE ) injection 10 mg, 10 mg, Intravenous, Q4H PRN, Lydia Sams, Vishal R, MD   ondansetron  (ZOFRAN ) injection 4 mg, 4 mg, Intravenous, Q6H PRN, Patel, Vishal R, MD   rosuvastatin  (CRESTOR ) tablet 20 mg, 20 mg, Oral, Daily, Consuelo Denmark, MD, 20 mg at 05/17/23 6213   senna-docusate (Senokot-S) tablet 1 tablet, 1 tablet, Oral, QHS PRN, Kenny Peals, MD, 1 tablet at 05/16/23 0931     Patients Current Diet:  Diet Order                  Diet Heart Room service appropriate? Yes; Fluid consistency: Thin  Diet effective now                         Precautions / Restrictions Precautions Precautions: Fall Restrictions Weight Bearing Restrictions Per Provider Order: No    Has the patient  had 2 or more falls or a fall with injury in the past year? Yes   Prior Activity Level Limited Community (1-2x/wk): Pt. went out about once a week   Prior Functional Level Self Care: Did the patient need help bathing, dressing, using the toilet or eating? Independent   Indoor Mobility: Did the patient need assistance with walking from room to room (with or without device)? Independent   Stairs: Did the patient need assistance with internal or external stairs (with  or without device)? Independent   Functional Cognition: Did the patient need help planning regular tasks such as shopping or remembering to take medications? Needed some help   Patient Information Are you of Hispanic, Latino/a,or Spanish origin?: A. No, not of Hispanic, Latino/a, or Spanish origin What is your race?: A. White Do you need or want an interpreter to communicate with a doctor or health care staff?: 0. No   Patient's Response To:  Health Literacy and Transportation Is the patient able to respond to health literacy and transportation needs?: Yes Health Literacy - How often do you need to have someone help you when you read instructions, pamphlets, or other written material from your doctor or pharmacy?: Never In the past 12 months, has lack of transportation kept you from medical appointments or from getting medications?: No In the past 12 months, has lack of transportation kept you from meetings, work, or from getting things needed for daily living?: No   Home Assistive Devices / Equipment Home Equipment: Agricultural consultant (2 wheels), Rollator (4 wheels), Grab bars - tub/shower, Hand held shower head, Shower seat - built in, Art gallery manager   Prior Device Use: Indicate devices/aids used by the patient prior to current illness, exacerbation or injury?  Electric scooter   Current Functional Level Cognition   Arousal/Alertness: Awake/alert Overall Cognitive Status: Impaired/Different from baseline (does have history  of cognitive impairments but family states worse now) Orientation Level: Disoriented to time Attention: Sustained Sustained Attention: Appears intact Memory: Impaired Memory Impairment: Retrieval deficit (3/5 word recall) Awareness: Impaired Awareness Impairment: Intellectual impairment, Emergent impairment, Anticipatory impairment Problem Solving: Impaired Problem Solving Impairment: Functional basic Safety/Judgment: Impaired    Extremity Assessment (includes Sensation/Coordination)   Upper Extremity Assessment: Generalized weakness  Lower Extremity Assessment: RLE deficits/detail, LLE deficits/detail RLE Deficits / Details: AROM WFL, strength grossly 3+/5 RLE Sensation: decreased light touch RLE Coordination: decreased fine motor, decreased gross motor LLE Deficits / Details: AROM WFL, strength grossly 4/5 LLE Sensation: decreased light touch LLE Coordination: decreased fine motor     ADLs   Overall ADL's : Needs assistance/impaired Eating/Feeding: Independent Grooming: Contact guard assist, Standing Upper Body Bathing: Set up, Supervision/ safety, Sitting Lower Body Bathing: Moderate assistance, Sit to/from stand Upper Body Dressing : Set up, Sitting Lower Body Dressing: Moderate assistance, Sit to/from stand Toilet Transfer: Moderate assistance, Rolling walker (2 wheels), Ambulation, Regular Toilet Toilet Transfer Details (indicate cue type and reason): mod A for mgmt of RW and problem solving turns in a smaller space Toileting- Clothing Manipulation and Hygiene: Contact guard assist, Sitting/lateral lean, Sit to/from stand Functional mobility during ADLs: Moderate assistance, Rolling walker (2 wheels) General ADL Comments: limited by generalized weakness, unsteady balance, impaired cognition     Mobility   Overal bed mobility: Needs Assistance Bed Mobility: Supine to Sit Supine to sit: Min assist     Transfers   Overall transfer level: Needs assistance Equipment used:  Rolling walker (2 wheels) Transfers: Sit to/from Stand Sit to Stand: Min assist General transfer comment: cues for hand placement     Ambulation / Gait / Stairs / Wheelchair Mobility   Ambulation/Gait Ambulation/Gait assistance: Min assist, +2 physical assistance Gait Distance (Feet): 20 Feet Assistive device: Rolling walker (2 wheels) Gait Pattern/deviations: Step-to pattern, Decreased stance time - right, Decreased step length - right, Decreased dorsiflexion - right, Decreased weight shift to right General Gait Details: minAx2 for ambulation due to decreased coordination, pt able to show large amplitude hip and knee flexion with marching in  place however with ambulation pt requires constant cuing for picking up his R foot and moving it forward, pt has pauses in his ambulation as though he forgets where he is going and needs cuing to continue. Gait velocity: variable Gait velocity interpretation: <1.8 ft/sec, indicate of risk for recurrent falls Pre-gait activities: marching in place     Posture / Balance Balance Overall balance assessment: Needs assistance Sitting-balance support: Feet supported Sitting balance-Leahy Scale: Good Standing balance support: Single extremity supported, During functional activity Standing balance-Leahy Scale: Fair Standing balance comment: statically, dynamically he needs BUE supported for safety and balance     Special needs/care consideration Skin intact  and Special service needs none    Previous Home Environment (from acute therapy documentation) Living Arrangements: Spouse/significant other  Lives With: Spouse Available Help at Discharge: Family, Available 24 hours/day Type of Home: House Home Layout: One level, Multi-level, Able to live on main level with bedroom/bathroom Home Access: Stairs to enter Entrance Stairs-Rails: Left Entrance Stairs-Number of Steps: 4 Bathroom Shower/Tub: Health visitor: Handicapped height Bathroom  Accessibility: Yes   Discharge Living Setting Plans for Discharge Living Setting: House Type of Home at Discharge: House Discharge Home Layout: One level Discharge Home Access: Stairs to enter Entrance Stairs-Rails: Left Entrance Stairs-Number of Steps: 3 Discharge Bathroom Shower/Tub: Walk-in shower Discharge Bathroom Toilet: Handicapped height Discharge Bathroom Accessibility: Yes How Accessible: Accessible via walker Does the patient have any problems obtaining your medications?: No   Social/Family/Support Systems Patient Roles: Spouse Contact Information: [phone] Anticipated Caregiver: 24/7 Anticipated Caregiver's Contact Information: Stana Ear Ability/Limitations of Caregiver: 24/7 min A Caregiver Availability: 24/7 Discharge Plan Discussed with Primary Caregiver: No Is Caregiver In Agreement with Plan?: Yes Does Caregiver/Family have Issues with Lodging/Transportation while Pt is in Rehab?: No   Goals Patient/Family Goal for Rehab: PT/OT/SLP Supervision level Expected length of stay: 7-10 days Pt/Family Agrees to Admission and willing to participate: Yes Program Orientation Provided & Reviewed with Pt/Caregiver Including Roles  & Responsibilities: Yes   Decrease burden of Care through IP rehab admission: not anticipated   Possible need for SNF placement upon discharge: not anticipated   Patient Condition: I have reviewed medical records from [facility] , spoken with CM, and patient and spouse. I discussed via phone for inpatient rehabilitation assessment.  Patient will benefit from ongoing PT, OT, and SLP, can actively participate in 3 hours of therapy a day 5 days of the week, and can make measurable gains during the admission.  Patient will also benefit from the coordinated team approach during an Inpatient Acute Rehabilitation admission.  The patient will receive intensive therapy as well as Rehabilitation physician, nursing, social worker, and care  management interventions.  Due to safety, skin/wound care, disease management, medication administration, pain management, and patient education the patient requires 24 hour a day rehabilitation nursing.  The patient is currently min A with mobility and basic ADLs.  Discharge setting and therapy post discharge at home with home health is anticipated.  Patient has agreed to participate in the Acute Inpatient Rehabilitation Program and will admit today.   Preadmission Screen Completed By:  Dorena Gander, 05/17/2023 12:29 PM ______________________________________________________________________   Discussed status with Dr. Rayleen Cal on 05/21/23 at 930 and received approval for admission today.   Admission Coordinator:  Dorena Gander, CCC-SLP, time 1013/Date 05/21/23    Assessment/Plan: Diagnosis: CVA Does the need for close, 24 hr/day Medical supervision in concert with the patient's rehab needs make it unreasonable for this  patient to be served in a less intensive setting? Yes Co-Morbidities requiring supervision/potential complications: HLD, Prior NPH, HTN, neuropathy, Left CCA stenosis, Aortic root dilation Due to bladder management, bowel management, safety, skin/wound care, disease management, medication administration, pain management, and patient education, does the patient require 24 hr/day rehab nursing? Yes Does the patient require coordinated care of a physician, rehab nurse, PT, OT, and SLP to address physical and functional deficits in the context of the above medical diagnosis(es)? Yes Addressing deficits in the following areas: balance, endurance, locomotion, strength, transferring, bowel/bladder control, bathing, dressing, feeding, grooming, toileting, cognition, speech, language, and psychosocial support Can the patient actively participate in an intensive therapy program of at least 3 hrs of therapy 5 days a week? Yes The potential for patient to make measurable gains while on  inpatient rehab is excellent Anticipated functional outcomes upon discharge from inpatient rehab: supervision PT, supervision OT, supervision SLP Estimated rehab length of stay to reach the above functional goals is: 7-10             Anticipated discharge destination: Home 10. Overall Rehab/Functional Prognosis: excellent     MD Signature: Lylia Sand

## 2023-05-21 NOTE — H&P (Signed)
 Physical Medicine and Rehabilitation Admission H&P        Chief Complaint  Patient presents with   Altered Mental Status  : HPI: Jared [name]. 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  Per chart review patient lives with spouse.  Multi level home 4 steps to entry.  Bed and bath on main level.  Uses an electric wheelchair for mobility in the home, furniture service and bathroom.  Rollator to get into the car.  Has a home health aide 2 times a week.  Presented 05/15/2023 with increasing confusion with garbled speech.  CT/MRI showed small acute/early subacute infarcts in the left parietal and right frontal occipital lobes.  CTA with no intracranial large vessel occlusion or significant stenosis.  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  Patient recently had an outpatient MRI at the [facility] (04/14/2019 24th) for ataxia and gait abnormality that showed no acute abnormality at that time concern for possible NPH with prior lacunar infarct.  Underwent an LP to further evaluate for NPH.  Opening pressure was not in chart review, labs unremarkable.  Patient had a neurology visit June 2024 with Dr. Albertina Hugger for evaluation of gait difficulty.  Appointment notes states that neuropathy started while on chemotherapy in 2019 walking difficulties in 2019 began with some falls in 2021.  Admission chemistries on this latest admission unremarkable.  Echocardiogram with ejection fraction of 70 to 75%.  Grade 1 diastolic dysfunction.  Neurology follow-up currently maintained on aspirin  81 mg daily and Plavix  75 mg daily for CVA prophylaxis x 3 weeks then aspirin  alone.  Lovenox  added for DVT prophylaxis.  CVAs felt to be likely cardioembolic. Patient did undergo loop  recorder placement 1/13 for monitoring for A-fib per Dr. Ardeen Kohler.  In regards to left CCA stenosis noted on CTA during workup of CVA patient would receive outpatient follow-up with vascular surgery.  Tolerating a regular consistency diet.  Therapy evaluations completed due to patient decreased functional mobility was admitted for a comprehensive rehab program.   Reports he lives in 3 story home but can stay on 1st floor. 4-5 steps to ender. Wife can assist 24/7 after discharge.    Review of Systems  Constitutional:  Negative for chills and fever.  HENT:  Negative for congestion and hearing loss.   Eyes:  Negative for blurred vision and double vision.  Respiratory:  Negative for cough, shortness of breath and wheezing.   Cardiovascular:  Negative for chest pain, palpitations and leg swelling.  Gastrointestinal:  Negative for abdominal pain, constipation, heartburn, nausea and vomiting.  Genitourinary:  Positive for urgency. Negative for dysuria, flank pain and hematuria.  Musculoskeletal:  Positive for falls, joint pain and myalgias.  Skin:  Negative for rash.  Neurological:  Positive for speech change and weakness. Negative for sensory change and headaches.  All other systems reviewed and are negative.       Past Medical History:  Diagnosis Date   colon cancer     History of kidney stones     Hypertension      Patient denies   Neuropathy due to chemotherapeutic drug [facility])      Neurologist says doesnt have it   Pre-diabetes               Past Surgical History:  Procedure  Laterality Date   CATARACT EXTRACTION, BILATERAL       COLON SURGERY   2004    R hemicolectomy - removed due to cancer   COLONOSCOPY       HEMICOLECTOMY       HERNIA REPAIR   2005   LOOP RECORDER INSERTION N/A 05/19/2023    Procedure: LOOP RECORDER INSERTION;  Surgeon: Tylene Galla, PA-C;  Location: [facility] INVASIVE CV LAB;  Service: Cardiovascular;  Laterality: N/A;   REVERSE SHOULDER ARTHROPLASTY  Left 11/29/2020    Procedure: REVERSE SHOULDER ARTHROPLASTY;  Surgeon: Micheline Ahr, MD;  Location: [facility] ORS;  Service: Orthopedics;  Laterality: Left;             Family History  Problem Relation Age of Onset   Other Mother          old age   Hodgkin's lymphoma Mother     Heart failure Father          Social History:  reports that he has never smoked. He has never used smokeless tobacco. He reports current alcohol  use of about 1.0 standard drink of alcohol  per week. He reports that he does not use drugs. Allergies:  Allergies  No Known Allergies         Medications Prior to Admission  Medication Sig Dispense Refill   Docusate Calcium  (STOOL SOFTENER PO) Take 1 tablet by mouth daily.       naproxen sodium (ALEVE) 220 MG tablet Take 220 mg by mouth daily as needed (pain).                  Home: Home Living Family/patient expects to be discharged to:: Private residence Living Arrangements: Spouse/significant other Available Help at Discharge: Family, Available 24 hours/day Type of Home: House Home Access: Stairs to enter [employer] of Steps: 4 Entrance Stairs-Rails: Left Home Layout: One level, Multi-level, Able to live on main level with bedroom/bathroom Bathroom Shower/Tub: Health visitor: Handicapped height Bathroom Accessibility: Yes Home Equipment: Agricultural consultant (2 wheels), Rollator (4 wheels), Grab bars - tub/shower, Hand held shower head, Shower seat - built in, Art gallery manager  Lives With: Spouse   Functional History: Prior Function Prior Level of Function : Independent/Modified Independent Mobility Comments: uses Art gallery manager for mobility in home, furniture surfs in bathroom, and when not using scooter. Rollator to get to car, wife drives. no falls in several years. ADLs Comments: bathes himself, with setup, HHAide 2x week, dresses himself   Functional Status:  Mobility: Bed Mobility Overal bed mobility: Needs  Assistance Bed Mobility: Supine to Sit, Sit to Supine Supine to sit: Min assist Sit to supine: Min assist General bed mobility comments: min A to scoot out to EOB with pt needing increased time to sequence, min A to return LEs to bed Transfers Overall transfer level: Needs assistance Equipment used: Rolling walker (2 wheels) Transfers: Sit to/from Stand Sit to Stand: Contact guard assist General transfer comment: cues for hand placement, good rise from low commode Ambulation/Gait Ambulation/Gait assistance: Min assist Gait Distance (Feet): 85 Feet (+ 15') Assistive device: Rolling walker (2 wheels) Gait Pattern/deviations: Step-to pattern, Decreased stance time - right, Decreased step length - right, Decreased dorsiflexion - right, Decreased weight shift to right General Gait Details: minA for ambulation due to decreased coordination, asssit needed to shift weight to L for RLE clearance, pt contineus to leave RLE behind and needing PRN physcial asssit to to lift and swing RLE through, pt contineus to need  consistent cues for coordination Gait velocity: variable Gait velocity interpretation: <1.8 ft/sec, indicate of risk for recurrent falls Pre-gait activities: marching in place   ADL: ADL Overall ADL's : Needs assistance/impaired Eating/Feeding: Independent Grooming: Contact guard assist, Standing, Oral care Upper Body Bathing: Set up, Supervision/ safety, Sitting Lower Body Bathing: Moderate assistance, Sit to/from stand Upper Body Dressing : Set up, Sitting Lower Body Dressing: Moderate assistance, Sit to/from stand Toilet Transfer: Minimal assistance, Rolling walker (2 wheels) Toilet Transfer Details (indicate cue type and reason): min A for cues Toileting- Clothing Manipulation and Hygiene: Contact guard assist, Sitting/lateral lean, Sit to/from stand Functional mobility during ADLs: Minimal assistance, Rolling walker (2 wheels) General ADL Comments: limited by RLE  incoordination, shufflign gait, unsteady balance and poor carryover   Cognition: Cognition Overall Cognitive Status: Impaired/Different from baseline Arousal/Alertness: Awake/alert Orientation Level: Disoriented to time, Disoriented to situation Year: 2025 Day of Week: Correct Attention: Sustained Sustained Attention: Appears intact Memory: Impaired Memory Impairment: Retrieval deficit (3/5 word recall) Awareness: Impaired Awareness Impairment: Intellectual impairment, Emergent impairment, Anticipatory impairment Problem Solving: Impaired Problem Solving Impairment: Functional basic Safety/Judgment: Impaired Cognition Arousal: Alert Behavior During Therapy: WFL for tasks assessed/performed Overall Cognitive Status: Impaired/Different from baseline General Comments: follows all simple 1 step commands with increased time, some increased time needed for processing   Physical Exam: Blood pressure (!) 143/86, pulse 86, temperature 98.7 F (37.1 C), temperature source Oral, resp. rate 20, height 5\' 10"  (1.778 m), weight 78.9 kg, SpO2 96%.     General: No apparent distress, eating pasta with his hands, food noted on his chest  HEENT: Head is normocephalic, atraumatic, sclera anicteric, oral mucosa pink and moist Neck: Supple without JVD or lymphadenopathy Heart: Reg rate and rhythm. No murmurs rubs or gallops Chest: CTA bilaterally without wheezes, rales, or rhonchi; no distress Abdomen: Soft, non-tender, non-distended, bowel sounds positive. Extremities: No clubbing, cyanosis, or edema. Pulses are 2+ Psych: Pt's affect is appropriate. Pt is cooperative Skin: Clean and intact without signs of breakdown GU: External catheter draining yellow urine Neuro:     Mental Status: AAOx3, memory fair,  Intermittent delayed speech. Limited historian Speech/Languate: Naming and repetition intact, fluent, follows simple commands- has more difficulty with multistep commands, a little impulsive at  times CRANIAL NERVES: II: PERRL III, IV, VI: EOM intact V: normal sensation bilaterally VII: no asymmetry VIII: normal hearing to speech IX, X: normal palatal elevation XI: 5/5 head turn and 5/5 shoulder shrug bilaterally XII: Tongue midline     MOTOR: RUE: 5/5 Deltoid, 5/5 Biceps, 5/5 Triceps,5/5 Grip LUE: 5/5 Deltoid, 5/5 Biceps, 5/5 Triceps, 5/5 Grip RLE: HF 4+/5, KE 5/5, ADF 5/5, APF 5/5 LLE: HF 4+/5, KE 5/5, ADF 5/5, APF 5/5     REFLEXES:  No clonus   SENSORY: Intact to LT all 4 extremities   Coordination: Normal finger to nose and heel to shin, no tremor, no dysmetria         Lab Results Last 48 Hours  No results found for this or any previous visit (from the past 48 hours).    Imaging Results (Last 48 hours)  EP PPM/ICD IMPLANT Result Date: 05/19/2023 CONCLUSIONS:  1. Successful implantation of a implantable loop recorder for a history of cryptogenic stroke  2. No early apparent complications. Tylene Galla, PA-C Cardiac Electrophysiology           Blood pressure (!) 143/86, pulse 86, temperature 98.7 F (37.1 C), temperature source Oral, resp. rate 20, height 5\' 10"  (1.778 m),  weight 78.9 kg, SpO2 96%.   Medical Problem List and Plan: 1. 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  Status post loop recorder placement 05/19/2023             -patient may  shower             -ELOS/Goals: 7-10 Sup PT/OT/SLP             -Admit to CIR 2.  Antithrombotics: -DVT/anticoagulation:  Pharmaceutical: Lovenox .  Venous Doppler studies negative             -antiplatelet therapy: Aspirin  81 mg daily and Plavix  75 mg day x 3 weeks then aspirin  alone 3. Pain Management: Tylenol  as needed 4. Mood/Behavior/Sleep: Provide emotional support             -antipsychotic agents: N/A 5. Neuropsych/cognition: This patient may be capable of making decisions on his own behalf. 6. Skin/Wound  Care: Routine skin checks 7. Fluids/Electrolytes/Nutrition: Routine in and outs with follow-up chemistries 8.  Hyperlipidemia.  Crestor  9.  Previous NPH workup/cognitive impairment.  Followed outpatient by neurology service Dr. Albertina Hugger.  B12, TSH, ESR, RPR normal. -Continue f/u with Dr. Albertina Hugger and [facility] neurology as outpatient -Uses Wheelchair/Scooter at home 10.  Left CCA stenosis.  CTA head and neck 60% stenosis distal left CCA secondary to plaque with ulceration. -Outpatient f/u with vascular surgery 11.  Hypertension.  Long term goal normotensive. Monitor with increased mobility.  Patient on no antihypertensive medications prior to admission. 12.  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  Follow-up outpatient       Sterling Eisenmenger, PA-C 05/21/2023

## 2023-05-21 NOTE — TOC Transition Note (Signed)
 Transition of Care [facility]) - Discharge Note   Patient Details  Name: Jared [name]. MRN: [id number] Date of Birth: May 02, 1941  [name] CM/SW Contact:  Jonathan Neighbor, RN Phone Number: 05/21/2023, 10:19 AM   Clinical Narrative:     Pt is discharging to CIR today. CM signing off.   Final next level of care: IP Rehab Facility Barriers to Discharge: No Barriers Identified   Patient Goals and CMS Choice            Discharge Placement                       Discharge Plan and Services Additional resources added to the After Visit Summary for                                       Social Drivers of Health (SDOH) Interventions SDOH Screenings   Food Insecurity: No Food Insecurity (05/16/2023)  Housing: Low Risk  (05/16/2023)  Transportation Needs: No Transportation Needs (05/16/2023)  Utilities: Not At Risk (05/16/2023)  Social Connections: Moderately Integrated (05/16/2023)  Tobacco Use: Low Risk  (05/15/2023)     Readmission Risk Interventions     No data to display

## 2023-05-21 NOTE — Discharge Summary (Signed)
 Physician Discharge Summary  Jared [name]. RUE:[id number] DOB: 1940/07/21 DOA: 05/15/2023  PCP: Jared [name]  Admit date: 05/15/2023 Discharge date: 05/21/2023    Admitted From: Home Disposition: CIR  Recommendations for Outpatient Follow-up:  Follow up with PCP in 1-2 weeks Please obtain BMP/CBC in one week Follow up with neurology as outpatient in 4 to 6 weeks Please follow up with your PCP on the following pending results: Unresulted Labs (From admission, onward)     Start     Ordered   05/15/23 1638  Rapid urine drug screen (hospital performed)  Add-on,   AD        05/15/23 1638              Home Health: None Equipment/Devices: None  Discharge Condition: Stable CODE STATUS: Full code Diet recommendation: Cardiac  Subjective: Seen and examined.  He has no complaints.  Brief/Interim Summary: 83 y.o. married male, chronic ambulatory dysfunction, uses a motorized scooter to transport in the house, with medical history significant for HTN, cognitive impairment, remote colon cancer with prior right hemicolectomy who presented to the ED for evaluation of abnormal speech (garbled speech and difficulty saying what he wanted to say) and increased confusion from baseline.  LKW 2100 on 05/14/2023.  Admitted for acute embolic strokes.  Neurology consulted.  They recommended ILR.  Neurology signed off 1/10.  Cardiology consulted for ILR.    No new events or acute findings over the weekend.  Patient had loop recorder placed by EP Cardiology on 1/13 for monitoring for A-fib and cryptogenic stroke.  As per inpatient rehab coordinator, initially indicated that a peer to peer review was requested by insurance company with attending but it appears that patient now has insurance authorization for CIR.  Remains medically stable for DC to CIR pending bed availability.  Acute ischemic stroke/hyperlipidemia Suspect embolic etiology given bilaterality of stroke involvement. CT head  without contrast 1/9: No acute intracranial abnormality.  Stable advanced atrophy and chronic microvascular ischemic changes MRI brain: Small acute or early subacute infarcts in the left parietal and right frontal and occipital lobes.  Advanced chronic microvascular ischemic change and cerebral atrophy. CTA head and neck: No intracranial LVO.  60% stenosis of the distal left CCA secondary to noncalcified plaque, with an area of focal plaque ulceration.  Severe stenosis at takeoff of left vertebral artery from the aorta. A1c 6 and LDL 124 2D echo: LVEF 70-75% grade 1 diastolic dysfunction B/L LEV dopplers> no DVT PT OT and SLP recommend AIR,  Neurology consulted and recommend aspirin  81 Mg daily + Plavix  75 Mg daily x 3 weeks and then aspirin  81 Mg alone thereafter.  Continue rosuvastatin  Stroke [name] follow-up appreciated.  Suspect cardioembolic etiology for stroke.  Loop recorder placed by EP Cardiology on 1/13, to rule out A-fib.  Will need outpatient follow-up with EP Cardiology at discharge Outpatient follow-up with Jared [name] at [facility] in about 4 weeks. Stable.  Being discharged to CIR today.   Essential hypertension Completed window for permissive hypertension.  Blood pressure is controlled off of meds.   Cognitive impairment/suspected dementia Per neurology, outpatient follow-up with neurology for possible NPH workup B12, folate, TSH, ESR, RPR normal.  UA unremarkable. As per stroke [name], [phone] NPH found per Jared [name] Continue follow-up with Jared [name] and [facility] Neurologist as outpatient. Apart from Colace and as needed Naprosyn, was not on any prescription meds as outpatient.   Left CCA stenosis: CT head and neck 60% stenosis in  the distal left CCA secondary to plaque with ulceration Continue antiplatelets as above and statins. Needs outpatient follow-up with vascular surgery   Ambulatory dysfunction AIR recommended by therapies, and being discharged to AIR.   Chest x-ray  abnormality Mild left basilar infiltrate versus atelectasis reported.  Follow-up chest x-ray in 4 to 6 weeks to ensure resolution.  This can be done outpatient via PCP. No clinical concern for pneumonia.   Aortic root dilatation measuring 38 mm Noted on echo 1/10. Outpatient follow-up.    Discharge plan was discussed with patient and/or family member and they verbalized understanding and agreed with it.  Discharge Diagnoses:  Principal Problem:   Acute CVA (cerebrovascular accident) [facility]) Active Problems:   Hypertension   Acute ischemic stroke [facility])    Discharge Instructions  Discharge Instructions     Ambulatory referral to Neurology   Complete by: As directed    Follow up with Jared [name] at [facility] in 4-6 weeks. Pt is Dr. Jetta Morrow pt. Thanks.      Allergies as of 05/21/2023   No Known Allergies      Medication List     STOP taking these medications    naproxen sodium 220 MG tablet Commonly known as: ALEVE       TAKE these medications    aspirin  EC 81 MG tablet Take 1 tablet (81 mg total) by mouth daily. Swallow whole. Start taking on: May 22, 2023   clopidogrel  75 MG tablet Commonly known as: PLAVIX  Take 1 tablet (75 mg total) by mouth daily for 15 days. Start taking on: May 22, 2023   rosuvastatin  20 MG tablet Commonly known as: CRESTOR  Take 1 tablet (20 mg total) by mouth daily. Start taking on: May 22, 2023   STOOL SOFTENER PO Take 1 tablet by mouth daily.        Follow-up Information     [name], Jared [name]. Schedule an appointment as soon as possible for a visit in 1 month(s).   Specialty: Neurology Contact information: [address] [place] [address] [phone]         Jared [name] Follow up in 1 week(s).   Specialty: Internal Medicine Contact information: [address] [place] [address] [phone]                No Known Allergies  Consultations: Neurology and EP  cardiology   Procedures/Studies: EP PPM/ICD IMPLANT Result Date: 05/19/2023 CONCLUSIONS:  1. Successful implantation of a implantable loop recorder for a history of cryptogenic stroke  2. No early apparent complications. Tylene Galla, PA-C Cardiac Electrophysiology   VAS US  LOWER EXTREMITY VENOUS (DVT) Result Date: 05/16/2023  Lower Venous DVT Study Patient Name:  Royel Borstad.  Date of Exam:   05/16/2023 Medical Rec #: [id number]         Accession #:    [id number] Date of Birth: [birth year]        Patient Gender: M Patient Age:   83 years Exam Location:  [facility] Procedure:      VAS US  LOWER EXTREMITY VENOUS (DVT) Referring Phys: ANAND HONGALGI --------------------------------------------------------------------------------  Indications: Stroke, and Embolic stroke.  Risk Factors: Cancer colon. Comparison Study: No prior exam. Performing Technologist: Ria Chad  Examination Guidelines: A complete evaluation includes B-mode imaging, spectral Doppler, color Doppler, and power Doppler as needed of all accessible portions of each vessel. Bilateral testing is considered an integral part of a complete examination. Limited examinations for reoccurring indications  may be performed as noted. The reflux portion of the exam is performed with the patient in reverse Trendelenburg.  +---------+---------------+---------+-----------+----------+--------------+ RIGHT    CompressibilityPhasicitySpontaneityPropertiesThrombus Aging +---------+---------------+---------+-----------+----------+--------------+ CFV      Full           Yes      Yes                                 +---------+---------------+---------+-----------+----------+--------------+ SFJ      Full           Yes      Yes                                 +---------+---------------+---------+-----------+----------+--------------+ FV Prox  Full                                                         +---------+---------------+---------+-----------+----------+--------------+ FV Mid   Full                                                        +---------+---------------+---------+-----------+----------+--------------+ FV DistalFull                                                        +---------+---------------+---------+-----------+----------+--------------+ PFV      Full                                                        +---------+---------------+---------+-----------+----------+--------------+ POP      Full           Yes      Yes                                 +---------+---------------+---------+-----------+----------+--------------+ PTV      Full                                                        +---------+---------------+---------+-----------+----------+--------------+ PERO     Full                                                        +---------+---------------+---------+-----------+----------+--------------+   +---------+---------------+---------+-----------+----------+--------------+ LEFT     CompressibilityPhasicitySpontaneityPropertiesThrombus Aging +---------+---------------+---------+-----------+----------+--------------+ CFV      Full           Yes  Yes                                 +---------+---------------+---------+-----------+----------+--------------+ SFJ      Full           Yes      Yes                                 +---------+---------------+---------+-----------+----------+--------------+ FV Prox  Full                                                        +---------+---------------+---------+-----------+----------+--------------+ FV Mid   Full                                                        +---------+---------------+---------+-----------+----------+--------------+ FV DistalFull                                                         +---------+---------------+---------+-----------+----------+--------------+ PFV      Full                                                        +---------+---------------+---------+-----------+----------+--------------+ POP      Full           Yes      Yes                                 +---------+---------------+---------+-----------+----------+--------------+ PTV      Full                                                        +---------+---------------+---------+-----------+----------+--------------+ PERO     Full                                                        +---------+---------------+---------+-----------+----------+--------------+     Summary: BILATERAL: - No evidence of deep vein thrombosis seen in the lower extremities, bilaterally. -No evidence of popliteal cyst, bilaterally.   *See table(s) above for measurements and observations. Electronically signed by Angela Kell [name] on 05/16/2023 at 7:17:54 PM.    Final    ECHOCARDIOGRAM COMPLETE Result Date: 05/16/2023    ECHOCARDIOGRAM REPORT   Patient Name:   Jared [name]. Date of Exam: 05/16/2023 Medical Rec #:  [id number]  Height:       70.0 in Accession #:    [id number]       Weight:       173.9 lb Date of Birth:  17-Oct-1940       BSA:          1.967 m Patient Age:    82 years         BP:           126/86 mmHg Patient Gender: M                HR:           78 bpm. Exam Location:  Inpatient Procedure: 2D Echo, Color Doppler and Cardiac Doppler Indications:   Stroke I63.9  History:       Patient has no prior history of Echocardiogram examinations.                Stroke.  Sonographer:   Hersey Lorenzo RDCS Referring      [phone] ANAND D HONGALGI Phys: IMPRESSIONS  1. Left ventricular ejection fraction, by estimation, is 70 to 75%. The left ventricle has hyperdynamic function. The left ventricle has no regional wall motion abnormalities. Left ventricular diastolic parameters are consistent with Grade I diastolic  dysfunction (impaired relaxation).  2. Right ventricular systolic function is normal. The right ventricular size is normal.  3. The mitral valve is normal in structure. No evidence of mitral valve regurgitation. No evidence of mitral stenosis.  4. The aortic valve is tricuspid. There is moderate calcification of the aortic valve. Aortic valve regurgitation is not visualized. Aortic valve sclerosis/calcification is present, without any evidence of aortic stenosis.  5. Aortic dilatation noted. There is borderline dilatation of the aortic root, measuring 38 mm.  6. The inferior vena cava is normal in size with greater than 50% respiratory variability, suggesting right atrial pressure of 3 mmHg. FINDINGS  Left Ventricle: Left ventricular ejection fraction, by estimation, is 70 to 75%. The left ventricle has hyperdynamic function. The left ventricle has no regional wall motion abnormalities. The left ventricular internal cavity size was normal in size. There is no left ventricular hypertrophy. Left ventricular diastolic parameters are consistent with Grade I diastolic dysfunction (impaired relaxation). Right Ventricle: The right ventricular size is normal. No increase in right ventricular wall thickness. Right ventricular systolic function is normal. Left Atrium: Left atrial size was normal in size. Right Atrium: Right atrial size was normal in size. Pericardium: There is no evidence of pericardial effusion. Mitral Valve: The mitral valve is normal in structure. No evidence of mitral valve regurgitation. No evidence of mitral valve stenosis. Tricuspid Valve: The tricuspid valve is normal in structure. Tricuspid valve regurgitation is trivial. No evidence of tricuspid stenosis. Aortic Valve: The aortic valve is tricuspid. There is moderate calcification of the aortic valve. Aortic valve regurgitation is not visualized. Aortic valve sclerosis/calcification is present, without any evidence of aortic stenosis. Pulmonic  Valve: The pulmonic valve was normal in structure. Pulmonic valve regurgitation is not visualized. No evidence of pulmonic stenosis. Aorta: Aortic dilatation noted. There is borderline dilatation of the aortic root, measuring 38 mm. Venous: The inferior vena cava is normal in size with greater than 50% respiratory variability, suggesting right atrial pressure of 3 mmHg. IAS/Shunts: No atrial level shunt detected by color flow Doppler.  LEFT VENTRICLE PLAX 2D LVIDd:         4.50 cm   Diastology LVIDs:         3.00  cm   LV e' medial:    6.42 cm/s LV PW:         1.00 cm   LV E/e' medial:  7.5 LV IVS:        1.10 cm   LV e' lateral:   4.46 cm/s LVOT diam:     2.30 cm   LV E/e' lateral: 10.8 LV SV:         66 LV SV Index:   33 LVOT Area:     4.15 cm  RIGHT VENTRICLE             IVC RV S prime:     20.60 cm/s  IVC diam: 1.80 cm TAPSE (M-mode): 2.3 cm LEFT ATRIUM           Index LA diam:      3.50 cm 1.78 cm/m LA Vol (A4C): 41.1 ml 20.89 ml/m  AORTIC VALVE LVOT Vmax:   82.90 cm/s LVOT Vmean:  60.400 cm/s LVOT VTI:    0.158 m  AORTA Ao Root diam: 3.80 cm Ao Asc diam:  3.60 cm MITRAL VALVE MV Area (PHT): 2.97 cm    SHUNTS MV E velocity: 48.00 cm/s  Systemic VTI:  0.16 m MV A velocity: 78.00 cm/s  Systemic Diam: 2.30 cm MV E/A ratio:  0.62 Jules Oar [name] Electronically signed by Jules Oar [name] Signature Date/Time: 05/16/2023/11:02:12 AM    Final    CT ANGIO HEAD NECK W WO CM Result Date: 05/15/2023 CLINICAL DATA:  Left parietal, right frontal, and right occipital infarcts on same-day MRI, determine embolic source EXAM: CT ANGIOGRAPHY HEAD AND NECK WITH AND WITHOUT CONTRAST TECHNIQUE: Multidetector CT imaging of the head and neck was performed using the standard protocol during bolus administration of intravenous contrast. Multiplanar CT image reconstructions and MIPs were obtained to evaluate the vascular anatomy. Carotid stenosis measurements (when applicable) are obtained utilizing NASCET criteria, using the  distal internal carotid diameter as the denominator. RADIATION DOSE REDUCTION: This exam was performed according to the departmental dose-optimization program which includes automated exposure control, adjustment of the mA and/or kV according to patient size and/or use of iterative reconstruction technique. CONTRAST:  75mL OMNIPAQUE  IOHEXOL  350 MG/ML SOLN COMPARISON:  05/15/2023 CT head, 05/15/2023 MRI head, no prior CTA available FINDINGS: CT HEAD FINDINGS For noncontrast findings, please see same day CT head. CTA NECK FINDINGS Aortic arch: Variant anatomy, with a common origin of the brachiocephalic and left common carotid artery, and the left vertebral artery originating from the aorta. Imaged portion shows no evidence of aneurysm or dissection. Severe stenosis at the origin of the left vertebral artery. Right carotid system: No evidence of dissection, occlusion, or hemodynamically significant stenosis (greater than 50%). Atherosclerotic disease at the bifurcation and in the proximal ICA is not hemodynamically significant. Left carotid system: Approximately 60% stenosis in the distal left CCA, secondary to noncalcified plaque primarily (series 5, image 215 and series 8, image 118), with an area of focal plaque ulceration (series 5, image 216). No hemodynamically significant stenosis in the left ICA or ECA. No evidence of dissection. Vertebral arteries: Severe stenosis at the takeoff of the left vertebral artery from the aorta. The left vertebral artery is otherwise patent to the skull base, without significant stenosis. Right dominant system. The right vertebral artery is patent from its origin to the skull base, without significant stenosis. No evidence of dissection. Skeleton: No acute osseous abnormality. Degenerative changes in the cervical spine. Other neck: No acute finding. Upper chest: No  focal pulmonary opacity or pleural effusion. Review of the MIP images confirms the above findings CTA HEAD FINDINGS  Anterior circulation: Both internal carotid arteries are patent to the termini, with calcifications but without significant stenosis. A1 segments patent. Normal anterior communicating artery. Anterior cerebral arteries are patent to their distal aspects without significant stenosis. No M1 stenosis or occlusion. MCA branches perfused to their distal aspects without significant stenosis. Posterior circulation: Vertebral arteries patent to the vertebrobasilar junction without significant stenosis. Posterior inferior cerebellar arteries patent proximally. Basilar patent to its distal aspect without significant stenosis. Superior cerebellar arteries patent proximally. Patent P1 segments. PCAs perfused to their distal aspects without significant stenosis. The bilateral posterior communicating arteries are not visualized. Venous sinuses: As permitted by contrast timing, patent. Anatomic variants: None significant. No evidence of aneurysm or vascular malformation. Review of the MIP images confirms the above findings IMPRESSION: 1. No intracranial large vessel occlusion or significant stenosis. 2. Approximately 60% stenosis in the distal left CCA, secondary to noncalcified plaque, with an area of focal plaque ulceration. 3. Severe stenosis at the takeoff of the left vertebral artery from the aorta. Electronically Signed   By: Zoila Hines M.D.   On: 05/15/2023 17:51   MR BRAIN WO CONTRAST Result Date: 05/15/2023 CLINICAL DATA:  Mental status change, unknown cause EXAM: MRI HEAD WITHOUT CONTRAST TECHNIQUE: Multiplanar, multiecho pulse sequences of the brain and surrounding structures were obtained without intravenous contrast. COMPARISON:  Same day CT head. FINDINGS: Brain: Small acute or early subacute infarcts in the left parietal and right frontal and occipital lobes. No substantial mass effect. Advanced T2/FLAIR hyperintensities in the white matter compatible with chronic microvascular ischemic change. Cerebral  atrophy. No evidence of acute hemorrhage, mass lesion, midline shift or hydrocephalus. Vascular: Major arterial flow voids are maintained at the skull base. Skull and upper cervical spine: Normal marrow signal. Sinuses/Orbits: Mild-to-moderate paranasal sinus mucosal thickening. No acute orbital findings. Other: Small right mastoid effusion. IMPRESSION: 1. Small acute or early subacute infarcts in the left parietal and right frontal and occipital lobes. Given involvement of multiple vascular territories, consider an embolic etiology. 2. Advanced chronic microvascular ischemic change and cerebral atrophy (ICD10-G31.9). Electronically Signed   By: Stevenson Elbe M.D.   On: 05/15/2023 15:25   CT Head Wo Contrast Result Date: 05/15/2023 CLINICAL DATA:  Altered mental status. EXAM: CT HEAD WITHOUT CONTRAST TECHNIQUE: Contiguous axial images were obtained from the base of the skull through the vertex without intravenous contrast. RADIATION DOSE REDUCTION: This exam was performed according to the departmental dose-optimization program which includes automated exposure control, adjustment of the mA and/or kV according to patient size and/or use of iterative reconstruction technique. COMPARISON:  MRI brain dated March 08, 2021. FINDINGS: Brain: No evidence of acute infarction, hemorrhage, hydrocephalus, extra-axial collection or mass lesion/mass effect. Stable advanced atrophy and chronic microvascular ischemic changes. Vascular: Calcified atherosclerosis at the skull base. No hyperdense vessel. Skull: Normal. Negative for fracture or focal lesion. Sinuses/Orbits: No acute finding. Other: None. IMPRESSION: 1. No acute intracranial abnormality. 2. Stable advanced atrophy and chronic microvascular ischemic changes. Electronically Signed   By: Aleta Anda M.D.   On: 05/15/2023 11:43   DG Chest Port 1 View Result Date: 05/15/2023 CLINICAL DATA:  Altered mental status. EXAM: PORTABLE CHEST 1 VIEW COMPARISON:  None  Available. FINDINGS: The heart size and mediastinal contours are within normal limits. Status post left total shoulder arthroplasty. Right lung is clear. Mild left basilar atelectasis or infiltrate is noted with small  left pleural effusion. IMPRESSION: Mild left basilar subsegmental atelectasis or infiltrate is noted with small left pleural effusion. Followup PA and lateral chest X-ray is recommended in 3-4 weeks following trial of antibiotic therapy to ensure resolution and exclude underlying malignancy. Electronically Signed   By: Rosalene Colon M.D.   On: 05/15/2023 11:30     Discharge Exam: Vitals:   05/21/23 0434 05/21/23 0805  BP: (!) 143/86 128/78  Pulse: 86 82  Resp: 20 17  Temp: 98.7 F (37.1 C) 98.1 F (36.7 C)  SpO2: 96% 95%   Vitals:   05/20/23 2001 05/21/23 0016 05/21/23 0434 05/21/23 0805  BP: (!) 144/85 131/64 (!) 143/86 128/78  Pulse: 80 85 86 82  Resp: (!) 21 20 20 17   Temp: 99.5 F (37.5 C) 99.1 F (37.3 C) 98.7 F (37.1 C) 98.1 F (36.7 C)  TempSrc: Oral Oral Oral Oral  SpO2: 96% 96% 96% 95%  Weight:      Height:        General: Pt is alert, awake, not in acute distress Cardiovascular: RRR, S1/S2 +, no rubs, no gallops Respiratory: CTA bilaterally, no wheezing, no rhonchi Abdominal: Soft, NT, ND, bowel sounds + Extremities: no edema, no cyanosis    The results of significant diagnostics from this hospitalization (including imaging, microbiology, ancillary and laboratory) are listed below for reference.     Microbiology: No results found for this or any previous visit (from the past 240 hours).   Labs: BNP (last 3 results) No results for input(s): "BNP" in the last 8760 hours. Basic Metabolic Panel: Recent Labs  Lab 05/15/23 1117 05/16/23 0658  NA 140 140  K 4.0 3.5  CL 109 106  CO2 24 25  GLUCOSE 121* 102*  BUN 13 11  CREATININE 0.73 0.72  CALCIUM  8.9 9.2  MG 2.1  --    Liver Function Tests: Recent Labs  Lab 05/15/23 1117  AST 16   ALT 13  ALKPHOS 84  BILITOT 0.8  PROT 6.8  ALBUMIN 3.5   No results for input(s): "LIPASE", "AMYLASE" in the last 168 hours. No results for input(s): "AMMONIA" in the last 168 hours. CBC: Recent Labs  Lab 05/15/23 1117 05/16/23 0658  WBC 7.8 5.9  NEUTROABS 5.4  --   HGB 14.9 14.2  HCT 44.4 41.8  MCV 91.5 88.0  PLT 281 276   Cardiac Enzymes: No results for input(s): "CKTOTAL", "CKMB", "CKMBINDEX", "TROPONINI" in the last 168 hours. BNP: Invalid input(s): "POCBNP" CBG: Recent Labs  Lab 05/16/23 0046  GLUCAP 106*   D-Dimer No results for input(s): "DDIMER" in the last 72 hours. Hgb A1c No results for input(s): "HGBA1C" in the last 72 hours. Lipid Profile No results for input(s): "CHOL", "HDL", "LDLCALC", "TRIG", "CHOLHDL", "LDLDIRECT" in the last 72 hours. Thyroid function studies No results for input(s): "TSH", "T4TOTAL", "T3FREE", "THYROIDAB" in the last 72 hours.  Invalid input(s): "FREET3" Anemia work up No results for input(s): "VITAMINB12", "FOLATE", "FERRITIN", "TIBC", "IRON", "RETICCTPCT" in the last 72 hours. Urinalysis    Component Value Date/Time   COLORURINE YELLOW 05/15/2023 1227   APPEARANCEUR CLEAR 05/15/2023 1227   LABSPEC 1.018 05/15/2023 1227   PHURINE 6.0 05/15/2023 1227   GLUCOSEU NEGATIVE 05/15/2023 1227   HGBUR NEGATIVE 05/15/2023 1227   BILIRUBINUR NEGATIVE 05/15/2023 1227   KETONESUR NEGATIVE 05/15/2023 1227   PROTEINUR NEGATIVE 05/15/2023 1227   UROBILINOGEN 0.2 03/02/2014 1107   NITRITE NEGATIVE 05/15/2023 1227   LEUKOCYTESUR NEGATIVE 05/15/2023 1227   Sepsis Labs  Recent Labs  Lab 05/15/23 1117 05/16/23 0658  WBC 7.8 5.9   Microbiology No results found for this or any previous visit (from the past 240 hours).  FURTHER DISCHARGE INSTRUCTIONS:   Get Medicines reviewed and adjusted: Please take all your medications with you for your next visit with your Primary [name]   Laboratory/radiological data: Please request your  Primary [name] to go over all hospital tests and procedure/radiological results at the follow up, please ask your Primary [name] to get all Hospital records sent to his/her office.   In some cases, they will be blood work, cultures and biopsy results pending at the time of your discharge. Please request that your primary care M.D. goes through all the records of your hospital data and follows up on these results.   Also Note the following: If you experience worsening of your admission symptoms, develop shortness of breath, life threatening emergency, suicidal or homicidal thoughts you must seek medical attention immediately by calling 911 or calling your [name] immediately  if symptoms less severe.   You must read complete instructions/literature along with all the possible adverse reactions/side effects for all the Medicines you take and that have been prescribed to you. Take any new Medicines after you have completely understood and accpet all the possible adverse reactions/side effects.    Do not drive when taking Pain medications or sleeping medications (Benzodaizepines)   Do not take more than prescribed Pain, Sleep and Anxiety Medications. It is not advisable to combine anxiety,sleep and pain medications without talking with your primary care practitioner   Special Instructions: If you have smoked or chewed Tobacco  in the last 2 yrs please stop smoking, stop any regular Alcohol   and or any Recreational drug use.   Wear Seat belts while driving.   Please note: You were cared for by a hospitalist during your hospital stay. Once you are discharged, your primary care physician will handle any further medical issues. Please note that NO REFILLS for any discharge medications will be authorized once you are discharged, as it is imperative that you return to your primary care physician (or establish a relationship with a primary care physician if you do not have one) for your post hospital discharge needs so  that they can reassess your need for medications and monitor your lab values  Time coordinating discharge: Over 30 minutes  SIGNED:   Modena Andes, [name]  [facility] 05/21/2023, 9:46 AM *Please note that this is a verbal dictation therefore any spelling or grammatical errors are due to the "Dragon Medical One" system interpretation. If 7PM-7AM, please contact night-coverage [email or web address]

## 2023-05-21 NOTE — Progress Notes (Signed)
 Notified wife Stana Ear that pt will be discharged and going to room 4W 10.  She thanked me for the call.

## 2023-05-22 ENCOUNTER — Inpatient Hospital Stay (HOSPITAL_COMMUNITY): Payer: Medicare PPO

## 2023-05-22 DIAGNOSIS — E440 Moderate protein-calorie malnutrition: Secondary | ICD-10-CM

## 2023-05-22 DIAGNOSIS — I10 Essential (primary) hypertension: Secondary | ICD-10-CM | POA: Diagnosis not present

## 2023-05-22 DIAGNOSIS — D72823 Leukemoid reaction: Secondary | ICD-10-CM

## 2023-05-22 DIAGNOSIS — I639 Cerebral infarction, unspecified: Secondary | ICD-10-CM | POA: Diagnosis not present

## 2023-05-22 LAB — CBC WITH DIFFERENTIAL/PLATELET
Abs Immature Granulocytes: 0.09 10*3/uL — ABNORMAL HIGH (ref 0.00–0.07)
Basophils Absolute: 0.1 10*3/uL (ref 0.0–0.1)
Basophils Relative: 0 %
Eosinophils Absolute: 0.1 10*3/uL (ref 0.0–0.5)
Eosinophils Relative: 0 %
HCT: 40.8 % (ref 39.0–52.0)
Hemoglobin: 13.8 g/dL (ref 13.0–17.0)
Immature Granulocytes: 1 %
Lymphocytes Relative: 8 %
Lymphs Abs: 1.5 10*3/uL (ref 0.7–4.0)
MCH: 30.5 pg (ref 26.0–34.0)
MCHC: 33.8 g/dL (ref 30.0–36.0)
MCV: 90.3 fL (ref 80.0–100.0)
Monocytes Absolute: 1.7 10*3/uL — ABNORMAL HIGH (ref 0.1–1.0)
Monocytes Relative: 9 %
Neutro Abs: 16 10*3/uL — ABNORMAL HIGH (ref 1.7–7.7)
Neutrophils Relative %: 82 %
Platelets: 257 10*3/uL (ref 150–400)
RBC: 4.52 MIL/uL (ref 4.22–5.81)
RDW: 12.2 % (ref 11.5–15.5)
WBC: 19.3 10*3/uL — ABNORMAL HIGH (ref 4.0–10.5)
nRBC: 0 % (ref 0.0–0.2)

## 2023-05-22 LAB — COMPREHENSIVE METABOLIC PANEL
ALT: 20 U/L (ref 0–44)
AST: 19 U/L (ref 15–41)
Albumin: 3 g/dL — ABNORMAL LOW (ref 3.5–5.0)
Alkaline Phosphatase: 89 U/L (ref 38–126)
Anion gap: 9 (ref 5–15)
BUN: 15 mg/dL (ref 8–23)
CO2: 24 mmol/L (ref 22–32)
Calcium: 8.8 mg/dL — ABNORMAL LOW (ref 8.9–10.3)
Chloride: 102 mmol/L (ref 98–111)
Creatinine, Ser: 1.17 mg/dL (ref 0.61–1.24)
GFR, Estimated: >60 mL/min (ref >60)
Glucose, Bld: 148 mg/dL — ABNORMAL HIGH (ref 70–99)
Potassium: 4.2 mmol/L (ref 3.5–5.1)
Sodium: 135 mmol/L (ref 135–145)
Total Bilirubin: 0.8 mg/dL (ref 0.0–1.2)
Total Protein: 6.5 g/dL (ref 6.5–8.1)

## 2023-05-22 NOTE — Discharge Instructions (Addendum)
Inpatient Rehab Discharge Instructions  Jared [name]. Discharge date and time: No discharge date for patient encounter.   Activities/Precautions/ Functional Status: Activity: activity as tolerated Diet: regular diet Wound Care: Routine skin checks Functional status:  ___ No restrictions     ___ Walk up steps independently ___ 24/7 supervision/assistance   ___ Walk up steps with assistance ___ Intermittent supervision/assistance  ___ Bathe/dress independently ___ Walk with walker     _x__ Bathe/dress with assistance ___ Walk Independently    ___ Shower independently ___ Walk with assistance    ___ Shower with assistance ___ No alcohol     ___ Return to work/school ________  Special Instructions: No driving smoking or alcoholSTROKE/TIA DISCHARGE INSTRUCTIONS SMOKING Cigarette smoking nearly doubles your risk of having a stroke & is the single most alterable risk factor  If you smoke or have smoked in the last 12 months, you are advised to quit smoking for your health. Most of the excess cardiovascular risk related to smoking disappears within a year of stopping. Ask you doctor about anti-smoking medications [place] Quit Line: 1-800-QUIT NOW Free Smoking Cessation Classes [phone]  CHOLESTEROL Know your levels; limit fat & cholesterol in your diet  Lipid Panel     Component Value Date/Time   CHOL 190 05/15/2023 1644   TRIG 90 05/15/2023 1644   HDL 48 05/15/2023 1644   CHOLHDL 4.0 05/15/2023 1644   VLDL 18 05/15/2023 1644   LDLCALC 124 (H) 05/15/2023 1644     Many patients benefit from treatment even if their cholesterol is at goal. Goal: Total Cholesterol (CHOL) less than 160 Goal:  Triglycerides (TRIG) less than 150 Goal:  HDL greater than 40 Goal:  LDL (LDLCALC) less than 100   BLOOD PRESSURE American Stroke Association blood pressure target is less that 120/80 mm/Hg  Your discharge blood pressure is:  BP: 133/81 Monitor your blood pressure Limit your salt and alcohol  intake Many individuals will require more than one medication for high blood pressure  DIABETES (A1c is a blood sugar average for last 3 months) Goal HGBA1c is under 7% (HBGA1c is blood sugar average for last 3 months)  Diabetes: No known diagnosis of diabetes    Lab Results  Component Value Date   HGBA1C 6.0 (H) 05/15/2023    Your HGBA1c can be lowered with medications, healthy diet, and exercise. Check your blood sugar as directed by your physician Call your physician if you experience unexplained or low blood sugars.  PHYSICAL ACTIVITY/REHABILITATION Goal is 30 minutes at least 4 days per week  Activity: Increase activity slowly, Therapies: Physical Therapy: Home Health Return to work:  Activity decreases your risk of heart attack and stroke and makes your heart stronger.  It helps control your weight and blood pressure; helps you relax and can improve your mood. Participate in a regular exercise program. Talk with your doctor about the best form of exercise for you (dancing, walking, swimming, cycling).  DIET/WEIGHT Goal is to maintain a healthy weight  Your discharge diet is:  Diet Order             Diet Heart Room service appropriate? Yes; Fluid consistency: Thin  Diet effective now                   liquids Your height is:  Height: 5\' 10"  (177.8 cm) Your current weight is: Weight: 75.9 kg Your Body Mass Index (BMI) is:  BMI (Calculated): 24.01 Following the type of diet specifically designed  for you will help prevent another stroke. Your goal weight range is:   Your goal Body Mass Index (BMI) is 19-24. Healthy food habits can help reduce 3 risk factors for stroke:  High cholesterol, hypertension, and excess weight.  RESOURCES Stroke/Support Group:  Call [phone]   STROKE EDUCATION PROVIDED/REVIEWED AND GIVEN TO PATIENT Stroke warning signs and symptoms How to activate emergency medical system (call 911). Medications prescribed at discharge. Need for follow-up  after discharge. Personal risk factors for stroke. Pneumonia vaccine given:  Flu vaccine given: No My questions have been answered, the writing is legible, and I understand these instructions.  I will adhere to these goals & educational materials that have been provided to me after my discharge from the hospital.    COMMUNITY REFERRALS UPON DISCHARGE:    Home Health:   PT      OT      ST     RN     SNA                    Agency:[facility]     Phone:[phone]  *Please expect follow-up within 2-3 business days for discharge to schedule your home visit. If you have not received follow-up, be sure to contact the site directly.*    Medical Equipment/Items Ordered:3in1 bedside commode and shower chair with back                                                 Agency[facility]Supplier:*[facility] to [facility] to [facility]*    My questions have been answered and I understand these instructions. I will adhere to these goals and the provided educational materials after my discharge from the hospital.  Patient/Caregiver Signature _______________________________ Date __________  Clinician Signature _______________________________________ Date __________  Please bring this form and your medication list with you to all your follow-up doctor's appointments.

## 2023-05-22 NOTE — Progress Notes (Signed)
Inpatient Rehabilitation Admission Medication Review by a Pharmacist  A complete drug regimen review was completed for this patient to identify any potential clinically significant medication issues.  High Risk Drug Classes Is patient taking? Indication by Medication  Antipsychotic No   Anticoagulant Yes Lovenox - DVT px  Antibiotic No   Opioid No   Antiplatelet Yes Asa/plavix x3 wks then 1/30 asa - CVA  Hypoglycemics/insulin No   Vasoactive Medication No   Chemotherapy No   Other Yes Crestor - HLD     Type of Medication Issue Identified Description of Issue Recommendation(s)  Drug Interaction(s) (clinically significant)     Duplicate Therapy     Allergy     No Medication Administration End Date     Incorrect Dose     Additional Drug Therapy Needed     Significant med changes from prior encounter (inform family/care partners about these prior to discharge).    Other       Clinically significant medication issues were identified that warrant physician communication and completion of prescribed/recommended actions by midnight of the next day:  No  Name of provider notified for urgent issues identified:   Provider Method of Notification:     Pharmacist comments:   Time spent performing this drug regimen review (minutes):  20   Ulyses Southward, PharmD, [place], AAHIVP, CPP Infectious Disease Pharmacist 05/21/2023 12:07 PM

## 2023-05-22 NOTE — Progress Notes (Signed)
Inpatient Rehabilitation  Patient information reviewed and entered into eRehab system by Demarrio Menges Treg Diemer, OTR/L, Rehab Quality Coordinator.   Information including medical coding, functional ability and quality indicators will be reviewed and updated through discharge.   

## 2023-05-22 NOTE — Plan of Care (Signed)
  Problem: Consults Goal: RH STROKE PATIENT EDUCATION Description: See Patient Education module for education specifics  Outcome: Progressing   Problem: RH BOWEL ELIMINATION Goal: RH STG MANAGE BOWEL WITH ASSISTANCE Description: STG Manage Bowel with medications  Outcome: Progressing   Problem: RH BLADDER ELIMINATION Goal: RH STG MANAGE BLADDER WITH ASSISTANCE Description: STG Manage Bladder With  toileting  Outcome: Progressing   Problem: RH SAFETY Goal: RH STG ADHERE TO SAFETY PRECAUTIONS W/ASSISTANCE/DEVICE Description: STG Adhere to Safety Precautions With supervision Outcome: Progressing   Problem: RH PAIN MANAGEMENT Goal: RH STG PAIN MANAGED AT OR BELOW PT'S PAIN GOAL Outcome: Progressing   Problem: RH KNOWLEDGE DEFICIT Goal: RH STG INCREASE KNOWLEDGE OF DIABETES Description: Wife will be able to manage DM using educational materials on medications and dietary modifications independently Outcome: Progressing Goal: RH STG INCREASE KNOWLEDGE OF HYPERTENSION Description: Wife will be able to manage HTN using educational materials on medications and dietary modifications independently Outcome: Progressing Goal: RH STG INCREASE KNOWLEGDE OF HYPERLIPIDEMIA Description: Wife will be able to manage HLD using educational materials on medications and dietary modifications independently Outcome: Progressing   Problem: RH KNOWLEDGE DEFICIT Goal: RH STG INCREASE KNOWLEDGE OF STROKE PROPHYLAXIS Description: Wife will be able to manage secondary risks using educational materials on medications and dietary modifications independently Outcome: Progressing

## 2023-05-22 NOTE — Evaluation (Addendum)
Speech Language Pathology Assessment and Plan  Patient Details  Name: Jared [name]. MRN: [id number] Date of Birth: December 09, 1940  SLP Diagnosis: Cognitive Impairments  Rehab Potential: Good ELOS: 7-10    Today's Date: 05/22/2023 SLP Individual Time: 0900-1000 SLP Individual Time Calculation (min): 60 min   Hospital Problem: Principal Problem:   Cardioembolic stroke [facility])  Past Medical History:  Past Medical History:  Diagnosis Date   colon cancer    History of kidney stones    Hypertension    Patient denies   Neuropathy due to chemotherapeutic drug [facility])    Neurologist says doesnt have it   Pre-diabetes    Past Surgical History:  Past Surgical History:  Procedure Laterality Date   CATARACT EXTRACTION, BILATERAL     COLON SURGERY  2004   R hemicolectomy - removed due to cancer   COLONOSCOPY     HEMICOLECTOMY     HERNIA REPAIR  2005   LOOP RECORDER INSERTION N/A 05/19/2023   Procedure: LOOP RECORDER INSERTION;  Surgeon: Graciella Freer, PA-C;  Location: [facility] INVASIVE CV LAB;  Service: Cardiovascular;  Laterality: N/A;   REVERSE SHOULDER ARTHROPLASTY Left 11/29/2020   Procedure: REVERSE SHOULDER ARTHROPLASTY;  Surgeon: Bjorn Pippin, MD;  Location: [facility] ORS;  Service: Orthopedics;  Laterality: Left;    Assessment / Plan / Recommendation Clinical Impression HPI: Jared [name]. 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 Per chart review patient lives with spouse. Multi level home 4 steps to entry. Bed and bath on main level. Uses an electric wheelchair for mobility in the home, furniture service and bathroom. Rollator to get into the car. Has a home health aide 2 times a week. Presented 05/15/2023 with increasing confusion with garbled speech. CT/MRI showed small  acute/early subacute infarcts in the left parietal and right frontal occipital lobes. CTA with no intracranial large vessel occlusion or significant stenosis. 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 Patient recently had an outpatient MRI at the [facility] (04/14/2019 24th) for ataxia and gait abnormality that showed no acute abnormality at that time concern for possible NPH with prior lacunar infarct. Underwent an LP to further evaluate for NPH. Opening pressure was not in chart review, labs unremarkable. Patient had a neurology visit June 2024 with Dr. Vickey Huger for evaluation of gait difficulty. Appointment notes states that neuropathy started while on chemotherapy in 2019 walking difficulties in 2019 began with some falls in 2021. Admission chemistries on this latest admission unremarkable. Echocardiogram with ejection fraction of 70 to 75%. Grade 1 diastolic dysfunction. Neurology follow-up currently maintained on aspirin 81 mg daily and Plavix 75 mg daily for CVA prophylaxis x 3 weeks then aspirin alone. Lovenox added for DVT prophylaxis. Patient did undergo loop recorder placement 1/13 for monitoring for A-fib per Dr. Nobie Putnam. In regards to left CCA stenosis noted on CTA during workup of CVA patient would receive outpatient follow-up with vascular surgery. Tolerating a regular consistency diet. Therapy evaluations completed due to patient decreased functional mobility was admitted for a comprehensive rehab program.   Clinical Impression:  Bedside Swallow Evaluation: A bedside swallow evaluation was completed to assess for s/sx of oropharyngeal dysphagia. Oral mechanism exam WFL. POs administered included thin liquids via cup and straw, purees and solids. Patient with timely swallow initiation, timely mastication and adequate oral clearance. No s/sx  of aspiration present. Recommend continuation of current diet with intermittent supervision due to cognitive deficits.  Medications may be offered whole in thin liquid. Encourage standardized swallowing precautions. SLP will sign off on swallowing.  Communication: Receptive and expressive language WFL.  Cognition: Patient was evaluated via the Cognistat to assess cognitive-linguistic functioning. According to evaluation, patient with severe deficits in memory (LTM & STM) and moderate deficits in orientation. Patient oriented to name, situation and location, however recalled incorrect age and time. Through informal evaluation, SLP observed deficits in basic problem solving, awareness of cognitive/physical deficits and safety/judgement. During evaluation, patient attempting to get out of bed without asking for help and noted impulsivity during transfer to bathroom. Recommend targeting basic problem solving, short and long term memory, intellectual awareness and safety/judgement.  Dysarthria: Patient 100% intelligible at the conversational level. Pt would benefit from skilled ST services to maximize cognition in order to maximize functional independence at d/c. Anticipate patient will require 24 hour supervision at d/c and f/u SLP services.    Skilled Therapeutic Interventions          Patient evaluated using a non-standardized cognitive linguistic assessment and bedside swallow evaluation to assess current cognitive, communicative and swallowing function. See above for details.    SLP Assessment  Patient will need skilled Speech Lanaguage Pathology Services during CIR admission    Recommendations  SLP Diet Recommendations: Age appropriate regular solids;Thin Liquid Administration via: Cup Medication Administration: Whole meds with liquid Supervision: Patient able to self feed;Intermittent supervision to cue for compensatory strategies Compensations: Minimize environmental distractions;Slow rate;Small sips/bites Postural Changes and/or Swallow Maneuvers: Seated upright 90 degrees Oral Care Recommendations: Oral care  BID Patient destination: Home Follow up Recommendations: Home Health SLP;Outpatient SLP;24 hour supervision/assistance Equipment Recommended: None recommended by SLP    SLP Frequency 3 to 5 out of 7 days   SLP Duration  SLP Intensity  SLP Treatment/Interventions 7-10  Minumum of 1-2 x/day, 30 to 90 minutes  Cognitive remediation/compensation;Cueing hierarchy;Environmental controls;Functional tasks;Internal/external aids;Patient/family education;Therapeutic Activities;Therapeutic Exercise    Pain None reported   SLP Evaluation Cognition Overall Cognitive Status: Impaired/Different from baseline Arousal/Alertness: Awake/alert Orientation Level: Oriented to person;Oriented to place;Oriented to situation;Disoriented to time Year: Other (Comment) (4098) Month: January Day of Week: Incorrect Sustained Attention: Appears intact Memory: Impaired Memory Impairment: Retrieval deficit;Decreased long term memory;Decreased short term memory Decreased Long Term Memory: Verbal basic;Functional basic Decreased Short Term Memory: Verbal basic;Functional basic Awareness: Impaired Awareness Impairment: Intellectual impairment;Emergent impairment;Anticipatory impairment Problem Solving: Impaired Problem Solving Impairment: Verbal basic;Functional basic Behaviors: Impulsive Safety/Judgment: Impaired  Comprehension Auditory Comprehension Overall Auditory Comprehension: Appears within functional limits for tasks assessed Expression Expression Primary Mode of Expression: Verbal Verbal Expression Overall Verbal Expression: Appears within functional limits for tasks assessed Written Expression Dominant Hand: Right Oral Motor Oral Motor/Sensory Function Overall Oral Motor/Sensory Function: Within functional limits Motor Speech Overall Motor Speech: Appears within functional limits for tasks assessed  Care Tool Care Tool Cognition Ability to hear (with hearing aid or hearing appliances if  normally used Ability to hear (with hearing aid or hearing appliances if normally used): 0. Adequate - no difficulty in normal conservation, social interaction, listening to TV (Simultaneous filing. User may not have seen previous data.)   Expression of Ideas and Wants Expression of Ideas and Wants: 3. Some difficulty - exhibits some difficulty with expressing needs and ideas (e.g, some words or finishing thoughts) or speech is not clear (Simultaneous filing. User may not have seen previous data.)   Understanding Verbal and  Non-Verbal Content Understanding Verbal and Non-Verbal Content: 3. Usually understands - understands most conversations, but misses some part/intent of message. Requires cues at times to understand (Simultaneous filing. User may not have seen previous data.)  Memory/Recall Ability Memory/Recall Ability : That he or she is in a hospital/hospital unit (Simultaneous filing. User may not have seen previous data.)   Bedside Swallowing Assessment General Previous Swallow Assessment: none Diet Prior to this Study: Regular;Thin liquids (Level 0) Respiratory Status: Room air Behavior/Cognition: Alert;Cooperative;Pleasant mood;Requires cueing Oral Cavity - Dentition: Adequate natural dentition Self-Feeding Abilities: Able to feed self Patient Positioning: Upright in bed Baseline Vocal Quality: Normal Volitional Cough: Strong Volitional Swallow: Able to elicit  Ice Chips Ice chips: Not tested Thin Liquid Thin Liquid: Within functional limits Presentation: Cup;Straw;Self Fed Nectar Thick Nectar Thick Liquid: Not tested Honey Thick Honey Thick Liquid: Not tested Puree Puree: Within functional limits Solid Solid: Within functional limits BSE Assessment Risk for Aspiration Impact on safety and function: Mild aspiration risk Other Related Risk Factors: Cognitive impairment;Other (comment) (safety awareness and impulsivity)  Short Term Goals: Week 1: SLP Short Term Goal 1  (Week 1): Patient will demonstrate orientation to time, place, location and self with mod multimodal A SLP Short Term Goal 2 (Week 1): Patient will demonstrate basic problem solving abilities in functional daily situations given mod multimodal A SLP Short Term Goal 3 (Week 1): Patient will recall biographical information with mod multimodal A SLP Short Term Goal 4 (Week 1): Patient will demonstrate awareness of cognitive and physical changes with max multimodal A  Refer to Care Plan for Long Term Goals  Recommendations for other services: None   Discharge Criteria: Patient will be discharged from SLP if patient refuses treatment 3 consecutive times without medical reason, if treatment goals not met, if there is a change in medical status, if patient makes no progress towards goals or if patient is discharged from hospital.  The above assessment, treatment plan, treatment alternatives and goals were discussed and mutually agreed upon: by patient  Shandrea Lusk M.A., CF-SLP 05/22/2023, 11:55 AM

## 2023-05-22 NOTE — Plan of Care (Signed)
  Problem: RH Cognition - SLP Goal: RH LTG Patient will demonstrate orientation with cues Description:  LTG:  Patient will demonstrate orientation to person/place/time/situation with cues (SLP)   Flowsheets (Taken 05/22/2023 1154) LTG Patient will demonstrate orientation to:  Time  Situation  Place  Person LTG: Patient will demonstrate orientation using cueing (SLP): Minimal Assistance - Patient > 75%   Problem: RH Problem Solving Goal: LTG Patient will demonstrate problem solving for (SLP) Description: LTG:  Patient will demonstrate problem solving for basic/complex daily situations with cues  (SLP) Flowsheets (Taken 05/22/2023 1154) LTG: Patient will demonstrate problem solving for (SLP): Basic daily situations LTG Patient will demonstrate problem solving for: Minimal Assistance - Patient > 75%   Problem: RH Memory Goal: LTG Patient will demonstrate ability for day to day (SLP) Description: LTG:   Patient will demonstrate ability for day to day recall/carryover during cognitive/linguistic activities with assist  (SLP) Flowsheets (Taken 05/22/2023 1154) LTG: Patient will demonstrate ability for day to day recall: (day to day information and PT/OT skills) Biographical information LTG: Patient will demonstrate ability for day to day recall/carryover during cognitive/linguistic activities with assist (SLP): Moderate Assistance - Patient 50 - 74%   Problem: RH Awareness Goal: LTG: Patient will demonstrate awareness during functional activites type of (SLP) Description: LTG: Patient will demonstrate awareness during functional activites type of (SLP) Flowsheets (Taken 05/22/2023 1154) Patient will demonstrate during cognitive/linguistic activities awareness type of: Intellectual LTG: Patient will demonstrate awareness during cognitive/linguistic activities with assistance of (SLP): Moderate Assistance - Patient 50 - 74%

## 2023-05-22 NOTE — Evaluation (Signed)
Physical Therapy Assessment and Plan  Patient Details  Name: Jared [name]. MRN: [id number] Date of Birth: 1941-04-30  PT Diagnosis: Abnormal posture, Abnormality of gait, Cognitive deficits, Coordination disorder, Difficulty walking, Impaired cognition, and Impaired sensation Rehab Potential: Fair ELOS: ~ 2 weeks   Today's Date: 05/22/2023 PT Individual Time: 1305-1420 PT Individual Time Calculation (min): 75 min    Hospital Problem: Principal Problem:   Cardioembolic stroke [facility])   Past Medical History:  Past Medical History:  Diagnosis Date   colon cancer    History of kidney stones    Hypertension    Patient denies   Neuropathy due to chemotherapeutic drug [facility])    Neurologist says doesnt have it   Pre-diabetes    Past Surgical History:  Past Surgical History:  Procedure Laterality Date   CATARACT EXTRACTION, BILATERAL     COLON SURGERY  2004   R hemicolectomy - removed due to cancer   COLONOSCOPY     HEMICOLECTOMY     HERNIA REPAIR  2005   LOOP RECORDER INSERTION N/A 05/19/2023   Procedure: LOOP RECORDER INSERTION;  Surgeon: Graciella Freer, PA-C;  Location: [facility] INVASIVE CV LAB;  Service: Cardiovascular;  Laterality: N/A;   REVERSE SHOULDER ARTHROPLASTY Left 11/29/2020   Procedure: REVERSE SHOULDER ARTHROPLASTY;  Surgeon: Bjorn Griff Badley, MD;  Location: [facility] ORS;  Service: Orthopedics;  Laterality: Left;    Assessment & Plan Clinical Impression: Patient is a 83 y.o. right-handed male with history significant for hypertension, cognitive impairment, remote colon cancer with prior right hemicolectomy, question neuropathy related to chemotherapy, prediabetes, left reverse shoulder arthroplasty 11/29/2020. Per chart review patient lives with spouse. Multi level home 4 steps to entry. Bed and bath on main level. Uses an electric wheelchair for mobility in the home, furniture service and bathroom. Rollator to get into the car. Has a home health aide 2 times a week.  Presented 05/15/2023 with increasing confusion with garbled speech. CT/MRI showed small acute/early subacute infarcts in the left parietal and right frontal occipital lobes. CTA with no intracranial large vessel occlusion or significant stenosis. 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 Patient recently had an outpatient MRI at the [facility] (04/14/2019 24th) for ataxia and gait abnormality that showed no acute abnormality at that time concern for possible NPH with prior lacunar infarct. Underwent an LP to further evaluate for NPH. Opening pressure was not in chart review, labs unremarkable. Patient had a neurology visit June 2024 with Dr. Vickey Huger for evaluation of gait difficulty. Appointment notes states that neuropathy started while on chemotherapy in 2019 walking difficulties in 2019 began with some falls in 2021. Admission chemistries on this latest admission unremarkable. Echocardiogram with ejection fraction of 70 to 75%. Grade 1 diastolic dysfunction. Neurology follow-up currently maintained on aspirin 81 mg daily and Plavix 75 mg daily for CVA prophylaxis x 3 weeks then aspirin alone. Lovenox added for DVT prophylaxis. CVAs felt to be likely cardioembolic. Patient did undergo loop recorder placement 1/13 for monitoring for A-fib per Dr. Nobie Putnam. In regards to left CCA stenosis noted on CTA during workup of CVA patient would receive outpatient follow-up with vascular surgery. Tolerating a regular consistency diet. Therapy evaluations completed due to patient decreased functional mobility was admitted for a comprehensive rehab program.  Patient transferred to CIR on 05/21/2023 .   Patient currently requires mod with mobility secondary to   , decreased cardiorespiratoy endurance, impaired timing and sequencing, unbalanced muscle activation, and decreased motor  planning, decreased midline orientation, decreased initiation, decreased attention, decreased awareness,  decreased problem solving, decreased safety awareness, decreased memory, and delayed processing, and decreased sitting balance, decreased standing balance, decreased postural control, and decreased balance strategies.  Prior to hospitalization, patient was modified independent  with mobility and lived with Spouse (wife, [name]) in a House home.  Home access is 4-5Stairs to enter.  Patient will benefit from skilled PT intervention to maximize safe functional mobility, minimize fall risk, and decrease caregiver burden for planned discharge home with 24 hour supervision and intermittent assist. Anticipate patient will benefit from follow up [facility] at discharge.  PT - End of Session Activity Tolerance: Tolerates 30+ min activity with multiple rests Endurance Deficit: Yes Endurance Deficit Description: requires seated rest breaks during mobility PT Assessment Rehab Potential (ACUTE/IP ONLY): Fair PT Barriers to Discharge: Decreased caregiver support;Home environment access/layout;Inaccessible home environment;Lack of/limited family support;Incontinence PT Patient demonstrates impairments in the following area(s): Balance;Safety;Sensory;Edema;Endurance;Motor;Nutrition;Pain;Perception PT Transfers Functional Problem(s): Bed Mobility;Bed to Chair;Car;Furniture PT Locomotion Functional Problem(s): Ambulation;Wheelchair Mobility;Stairs PT Plan PT Intensity: Minimum of 1-2 x/day ,45 to 90 minutes PT Frequency: 5 out of 7 days PT Duration Estimated Length of Stay: ~ 2 weeks PT Treatment/Interventions: Ambulation/gait training;Community reintegration;Neuromuscular re-education;DME/adaptive equipment instruction;Psychosocial support;Stair training;UE/LE Strength taining/ROM;Wheelchair propulsion/positioning;Balance/vestibular training;Discharge planning;Pain management;Skin care/wound management;Therapeutic Activities;UE/LE Coordination activities;Cognitive remediation/compensation;Disease  management/prevention;Functional mobility training;Patient/family education;Therapeutic Exercise;Visual/perceptual remediation/compensation PT Transfers Anticipated Outcome(s): supervision using LRAD PT Locomotion Anticipated Outcome(s): CGA using LRAD PT Recommendation Recommendations for Other Services: None Follow Up Recommendations: Home health PT;24 hour supervision/assistance Patient destination: Home Equipment Recommended: To be determined;Other (comment) Equipment Details: may need custom wheelchair referral   PT Evaluation Precautions/Restrictions Precautions Precautions: Other (comment);Fall Precaution Comments: lack of wt shift w/ shuffled gait and anterior lean; very delayed processing for all tasks Restrictions Weight Bearing Restrictions Per Provider Order: No Pain Pain Assessment Pain Scale: 0-10 Pain Score: 0-No pain Pain Interference Pain Interference Pain Effect on Sleep: 0. Does not apply - I have not had any pain or hurting in the past 5 days Pain Interference with Therapy Activities: 1. Rarely or not at all Pain Interference with Day-to-Day Activities: 1. Rarely or not at all Home Living/Prior Functioning Home Living Available Help at Discharge: Family;Available 24 hours/day;Personal care attendant;Available PRN/intermittently (wife (who is 10years younger than patient); personal care attendant 2x/week (8hrs starting at 8AM usually)) Type of Home: House Home Access: Stairs to enter [employer] of Steps: 4-5 Entrance Stairs-Rails: Left (L HR on back stairs) Home Layout: Multi-level;Able to live on main level with bedroom/bathroom Bathroom Shower/Tub: Health visitor: Handicapped height Bathroom Accessibility: Yes Additional Comments: uses motorized scooter in home per pt and chart review; reports his wife would assist him OOB and into the scooter  Lives With: Spouse (wife, [name]) Prior Function Level of Independence: Requires  assistive device for independence;Needs assistance with tranfers;Needs assistance with gait  Able to Take Stairs?: Yes (per chart review was able to navigate stairs for home entry and used rollator to get to car) Vocation: Retired Gaffer: Radio broadcast assistant Vision/Perception  Vision - History Ability to See in Adequate Light: 0 Adequate Vision - Assessment Additional Comments: Not formally assessed - but nothing noted functionally Perception Perception: Impaired Preception Impairment Details: Spatial orientation;Body Part identification Perception-Other Comments: difficulty differentiating R and L side of his body Praxis Praxis: Impaired Praxis Impairment Details: Motor planning;Organization;Ideomotor;Initiation;Limb apraxia  Cognition Overall Cognitive Status: Impaired/Different from baseline Arousal/Alertness: Awake/alert Orientation Level: Oriented to person;Oriented to place;Oriented to situation;Disoriented to time (knows  month, day, but reports it is 2024) Year: 2024 Month: January Day of Week: Incorrect (1 day off) Attention: Focused;Sustained;Selective Focused Attention: Appears intact Sustained Attention: Appears intact Selective Attention: Impaired Memory: Impaired Memory Impairment: Retrieval deficit;Decreased long term memory;Decreased short term memory Awareness: Impaired Problem Solving: Impaired Problem Solving Impairment: Functional basic Initiating: Impaired Initiating Impairment: Functional basic Self Monitoring: Impaired Self Monitoring Impairment: Functional basic Self Correcting: Impaired Self Correcting Impairment: Functional basic Behaviors: Perseveration;Poor frustration tolerance;Impulsive;Restless Safety/Judgment: Impaired Sensation Sensation Light Touch: Impaired Detail Central sensation comments: pt unable to feel touch in L LE (requires increased time for testing due to delayed processing) Light Touch Impaired Details: Impaired  LLE Hot/Cold: Not tested Proprioception: Impaired by gross assessment Stereognosis: Not tested Coordination Gross Motor Movements are Fluid and Coordinated: No Fine Motor Movements are Fluid and Coordinated: No Coordination and Movement Description: significantly delayed processing, impaired initiation, impaired motor planning, and impaired execution of motor plans Finger Nose Finger Test: Mild dysmetria left >right Motor  Motor Motor: Abnormal postural alignment and control;Motor impersistence Motor - Skilled Clinical Observations: significantly delayed processing, impaired initiation, impaired motor planning, and impaired execution of motor tasks   Trunk/Postural Assessment  Cervical Assessment Cervical Assessment: Exceptions to [facility] (mild forward head) Thoracic Assessment Thoracic Assessment: Exceptions to [facility] (mild thoracic kyphosis) Lumbar Assessment Lumbar Assessment: Exceptions to [facility] (posterior pelvic tilt) Postural Control Postural Control: Deficits on evaluation Protective Responses: significant delay with all mobility tasks and balance strategies Postural Limitations: decreased  Balance Balance Balance Assessed: Yes Static Sitting Balance Static Sitting - Balance Support: Left upper extremity supported;Right upper extremity supported;Feet supported Static Sitting - Level of Assistance: 5: Stand by assistance Dynamic Sitting Balance Dynamic Sitting - Balance Support: Feet supported;Right upper extremity supported;Left upper extremity supported Dynamic Sitting - Level of Assistance: 4: Min assist Static Standing Balance Static Standing - Balance Support: During functional activity;Bilateral upper extremity supported Static Standing - Level of Assistance: 4: Min assist Dynamic Standing Balance Dynamic Standing - Balance Support: During functional activity;Bilateral upper extremity supported Dynamic Standing - Level of Assistance: 3: Mod assist;4: Min assist Extremity  Assessment  RLE Assessment RLE Assessment: Exceptions to [facility] Active Range of Motion (AROM) Comments: WFL/WNL General Strength Comments: assessed sitting in w/c RLE Strength Right Hip Flexion: 5/5 Right Knee Flexion: 5/5 Right Knee Extension: 5/5 Right Ankle Dorsiflexion: 4+/5 Right Ankle Plantar Flexion: 4+/5 LLE Assessment LLE Assessment: Exceptions to [facility] Active Range of Motion (AROM) Comments: WFL/WNL General Strength Comments: assessed sitting in w/c LLE Strength Left Hip Flexion: 4+/5 Left Knee Flexion: 5/5 Left Knee Extension: 5/5 Left Ankle Dorsiflexion: 4+/5 Left Ankle Plantar Flexion: 4+/5  Care Tool Care Tool Bed Mobility Roll left and right activity   Roll left and right assist level: Minimal Assistance - Patient > 75%    Sit to lying activity   Sit to lying assist level: Moderate Assistance - Patient 50 - 74%    Lying to sitting on side of bed activity   Lying to sitting on side of bed assist level: the ability to move from lying on the back to sitting on the side of the bed with no back support.: Moderate Assistance - Patient 50 - 74%     Care Tool Transfers Sit to stand transfer   Sit to stand assist level: Minimal Assistance - Patient > 75%    Chair/bed transfer   Chair/bed transfer assist level: Moderate Assistance - Patient 50 - 74%    Car transfer   Car transfer assist level:  Moderate Assistance - Patient 50 - 74%      Care Tool Locomotion Ambulation   Assist level: Moderate Assistance - Patient 50 - 74% Assistive device: Walker-rolling Max distance: 27ft  Walk 10 feet activity   Assist level: Moderate Assistance - Patient - 50 - 74% Assistive device: Walker-rolling   Walk 50 feet with 2 turns activity Walk 50 feet with 2 turns activity did not occur: Safety/medical concerns      Walk 150 feet activity Walk 150 feet activity did not occur: Safety/medical concerns      Walk 10 feet on uneven surfaces activity Walk 10 feet on uneven surfaces  activity did not occur: Safety/medical concerns      Stairs   Assist level: Moderate Assistance - Patient - 50 - 74% Stairs assistive device: 1 hand rail Max number of stairs: 4  Walk up/down 1 step activity   Walk up/down 1 step (curb) assist level: Moderate Assistance - Patient - 50 - 74% Walk up/down 1 step or curb assistive device: 1 hand rail  Walk up/down 4 steps activity   Walk up/down 4 steps assist level: Moderate Assistance - Patient - 50 - 74% Walk up/down 4 steps assistive device: 1 hand rail  Walk up/down 12 steps activity Walk up/down 12 steps activity did not occur: Safety/medical concerns      Pick up small objects from floor   Pick up small object from the floor assist level: Total Assistance - Patient < 25% Pick up small object from the floor assistive device: using RW  Wheelchair Is the patient using a wheelchair?: Yes (for transport at this time) Type of Wheelchair: Manual (used scooter at baseline)   Wheelchair assist level: Dependent - Patient 0%    Wheel 50 feet with 2 turns activity   Assist Level: Dependent - Patient 0%  Wheel 150 feet activity   Assist Level: Dependent - Patient 0%    Refer to Care Plan for Long Term Goals  SHORT TERM GOAL WEEK 1 PT Short Term Goal 1 (Week 1): Pt will perform supine<>sit with min A PT Short Term Goal 2 (Week 1): Pt will perform sit<>stands using LRAD with CGA PT Short Term Goal 3 (Week 1): Pt will consistently perform bed<>chair transfers using LRAD with min A PT Short Term Goal 4 (Week 1): Pt will ambulate at least 13ft using LRAD with min A PT Short Term Goal 5 (Week 1): Pt will participate in assessment of the need for a custom wheelchair to allow him increased independence with functional mobility in the home  Recommendations for other services: None   Skilled Therapeutic Intervention Pt received sitting on BSC over toilet with NT present and pt agreeable to therapy session so therapist assumed care of patient.  Evaluation completed (see details above) with patient education regarding purpose of PT evaluation, PT POC and goals, therapy schedule, weekly team meetings, and other CIR information including safety plan and fall risk safety. Pt performed the below functional mobility tasks with the specified levels of skilled cuing and assistance.  Throughout session, pt demos significant processing delay requiring increased time to verbally and physically respond to therapist's questions and cuing. Pt with overall very slow motor movements and impaired ability to weight shift to allow him to take a step during transfers and during gait training.   Pt's wife arrived towards end of session to observe patient's gait, stairs, and transfer and reports pt is much more delayed now than his baseline.   Pt  at baseline performs side stepping stair navigation technique using B UE support on L HR - educated on potential need for ramp or wheelchair lift to allow more independent and safe home access to patient depending on his progress.   During gait training therapist having to provide consistent and significant wt shifting facilitation to allow him to initiate taking a step bilaterally (requires more facilitation to wt shift onto L stance to allow R swing advancement) - pt's wife reports pt had harder time stepping R LE at baseline also.   Pt reports urgency to void again - pt left seated on BSC over toilet with nurse present to assume care of patient.   Mobility  Bed Mobility Bed Mobility: Sit to Supine;Supine to Sit Rolling Right: Minimal Assistance - Patient > 75% Rolling Left: Minimal Assistance - Patient > 75% Supine to Sit: Moderate Assistance - Patient 50-74% Sit to Supine: Moderate Assistance - Patient 50-74% Transfers Transfers: Sit to Stand;Stand to Sit;Stand Pivot Transfers Sit to Stand: Minimal Assistance - Patient > 75% Stand to Sit: Minimal Assistance - Patient > 75% Stand Pivot Transfers: Moderate  Assistance - Patient 50 - 74% Stand Pivot Transfer Details: Verbal cues for technique;Tactile cues for weight shifting;Tactile cues for sequencing;Tactile cues for initiation;Manual facilitation for weight shifting Transfer (Assistive device): Rolling walker Locomotion  Gait Ambulation: Yes Gait Assistance: Moderate Assistance - Patient 50-74% Gait Distance (Feet): 20 Feet Assistive device: Rolling walker Gait Assistance Details: Manual facilitation for weight shifting Gait Gait: Yes Gait Pattern: Impaired Gait Pattern: Poor foot clearance - left;Poor foot clearance - right;Shuffle;Step-through pattern;Decreased step length - right;Decreased step length - left;Decreased stride length;Decreased hip/knee flexion - right;Decreased hip/knee flexion - left (shuffled gait on his toes, requires significant amount of manual facilitaiton for weight shifting onto stance limb (more assist to wt shift L than R)) Gait velocity: severely decreased with frequent pauses in stance Stairs / Additional Locomotion Stairs: Yes Stairs Assistance: Minimal Assistance - Patient > 75%;Moderate Assistance - Patient 50 - 74% Stair Management Technique: One rail Left;Step to pattern;Sideways Number of Stairs: 4 Height of Stairs: 6 Wheelchair Mobility Wheelchair Mobility: No   Discharge Criteria: Patient will be discharged from PT if patient refuses treatment 3 consecutive times without medical reason, if treatment goals not met, if there is a change in medical status, if patient makes no progress towards goals or if patient is discharged from hospital.  The above assessment, treatment plan, treatment alternatives and goals were discussed and mutually agreed upon: by patient and by family  Ginny Forth , PT, DPT, NCS, CSRS 05/22/2023, 3:16 PM

## 2023-05-22 NOTE — Plan of Care (Signed)
  Problem: RH Balance Goal: LTG Patient will maintain dynamic sitting balance (PT) Description: LTG:  Patient will maintain dynamic sitting balance with assistance during mobility activities (PT) Flowsheets (Taken 05/22/2023 1526) LTG: Pt will maintain dynamic sitting balance during mobility activities with:: Supervision/Verbal cueing Goal: LTG Patient will maintain dynamic standing balance (PT) Description: LTG:  Patient will maintain dynamic standing balance with assistance during mobility activities (PT) Flowsheets (Taken 05/22/2023 1526) LTG: Pt will maintain dynamic standing balance during mobility activities with:: Contact Guard/Touching assist   Problem: Sit to Stand Goal: LTG:  Patient will perform sit to stand with assistance level (PT) Description: LTG:  Patient will perform sit to stand with assistance level (PT) Flowsheets (Taken 05/22/2023 1526) LTG: PT will perform sit to stand in preparation for functional mobility with assistance level: Supervision/Verbal cueing   Problem: RH Bed Mobility Goal: LTG Patient will perform bed mobility with assist (PT) Description: LTG: Patient will perform bed mobility with assistance, with/without cues (PT). Flowsheets (Taken 05/22/2023 1526) LTG: Pt will perform bed mobility with assistance level of: Supervision/Verbal cueing   Problem: RH Bed to Chair Transfers Goal: LTG Patient will perform bed/chair transfers w/assist (PT) Description: LTG: Patient will perform bed to chair transfers with assistance (PT). Flowsheets (Taken 05/22/2023 1526) LTG: Pt will perform Bed to Chair Transfers with assistance level: Supervision/Verbal cueing   Problem: RH Car Transfers Goal: LTG Patient will perform car transfers with assist (PT) Description: LTG: Patient will perform car transfers with assistance (PT). Flowsheets (Taken 05/22/2023 1526) LTG: Pt will perform car transfers with assist:: Contact Guard/Touching assist   Problem: RH Ambulation Goal:  LTG Patient will ambulate in controlled environment (PT) Description: LTG: Patient will ambulate in a controlled environment, # of feet with assistance (PT). Flowsheets (Taken 05/22/2023 1526) LTG: Pt will ambulate in controlled environ  assist needed:: Contact Guard/Touching assist LTG: Ambulation distance in controlled environment: 167ft using LRAD Goal: LTG Patient will ambulate in home environment (PT) Description: LTG: Patient will ambulate in home environment, # of feet with assistance (PT). Flowsheets (Taken 05/22/2023 1526) LTG: Pt will ambulate in home environ  assist needed:: Contact Guard/Touching assist LTG: Ambulation distance in home environment: 25ft using LRAD   Problem: RH Wheelchair Mobility Goal: LTG Patient will propel w/c in controlled environment (PT) Description: LTG: Patient will propel wheelchair in controlled environment, # of feet with assist (PT) Flowsheets (Taken 05/22/2023 1526) LTG: Pt will propel w/c in controlled environ  assist needed:: Supervision/Verbal cueing LTG: Propel w/c distance in controlled environment: 128ft Goal: LTG Patient will propel w/c in home environment (PT) Description: LTG: Patient will propel wheelchair in home environment, # of feet with assistance (PT). Flowsheets (Taken 05/22/2023 1526) LTG: Pt will propel w/c in home environ  assist needed:: Supervision/Verbal cueing LTG: Propel w/c distance in home environment: 45ft   Problem: RH Stairs Goal: LTG Patient will ambulate up and down stairs w/assist (PT) Description: LTG: Patient will ambulate up and down # of stairs with assistance (PT) Flowsheets (Taken 05/22/2023 1526) LTG: Pt will ambulate up/down stairs assist needed:: Contact Guard/Touching assist LTG: Pt will  ambulate up and down number of stairs: 4 steps using L HR per home set-up

## 2023-05-22 NOTE — Evaluation (Signed)
Occupational Therapy Assessment and Plan  Patient Details  Name: Lauriano Czapla. MRN: [id number] Date of Birth: 19-Nov-1940  OT Diagnosis: abnormal posture, apraxia, cognitive deficits, and muscle weakness (generalized) Rehab Potential: Rehab Potential (ACUTE ONLY): Good ELOS: 7-9 Days   Today's Date: 05/22/2023 OT Individual Time: 1000-1105 OT Individual Time Calculation (min): 65 min     Hospital Problem: Principal Problem:   Cardioembolic stroke [facility])   Past Medical History:  Past Medical History:  Diagnosis Date   colon cancer    History of kidney stones    Hypertension    Patient denies   Neuropathy due to chemotherapeutic drug [facility])    Neurologist says doesnt have it   Pre-diabetes    Past Surgical History:  Past Surgical History:  Procedure Laterality Date   CATARACT EXTRACTION, BILATERAL     COLON SURGERY  2004   R hemicolectomy - removed due to cancer   COLONOSCOPY     HEMICOLECTOMY     HERNIA REPAIR  2005   LOOP RECORDER INSERTION N/A 05/19/2023   Procedure: LOOP RECORDER INSERTION;  Surgeon: Graciella Freer, PA-C;  Location: [facility] INVASIVE CV LAB;  Service: Cardiovascular;  Laterality: N/A;   REVERSE SHOULDER ARTHROPLASTY Left 11/29/2020   Procedure: REVERSE SHOULDER ARTHROPLASTY;  Surgeon: Bjorn Pippin, MD;  Location: [facility] ORS;  Service: Orthopedics;  Laterality: Left;    Assessment & Plan Clinical Impression: Dharius Billy is an 83 year old right-handed male with history significant for hypertension, cognitive impairment, remote colon cancer with prior right hemicolectomy, question neuropathy related to chemotherapy, prediabetes, left reverse shoulder arthroplasty 11/29/2020.  Per chart review patient lives with spouse.  Multi level home 4 steps to entry.  Bed and bath on main level.  Uses an electric wheelchair for mobility in the home.  Rollator to get into the car.  Has a home health aide 2 times a week.  Presented 05/15/2023 with increasing confusion with  garbled speech.  CT/MRI showed small acute/early subacute infarcts in the left parietal and right frontal occipital lobes. Patient recently had an outpatient MRI at the [facility] (04/14/2019 24th) for ataxia and gait abnormality that showed no acute abnormality at that time concern for possible NPH with prior lacunar infarct.  Underwent an LP to further evaluate for NPH.  Opening pressure was not in chart review, labs unremarkable.  Patient had a neurology visit June 2024 with Dr. Vickey Huger for evaluation of gait difficulty.  Appointment notes states that neuropathy started while on chemotherapy in 2019 walking difficulties in 2019 began with some falls in 2021.   Neurology follow-up currently maintained on aspirin 81 mg daily and Plavix 75 mg daily for CVA prophylaxis x 3 weeks then aspirin alone.  Lovenox added for DVT prophylaxis.  CVAs felt to be likely cardioembolic. Patient did undergo loop recorder placement 1/13 for monitoring for A-fib per Dr. Nobie Putnam.  In regards to left CCA stenosis noted on CTA during workup of CVA patient would receive outpatient follow-up with vascular surgery.  Tolerating a regular consistency diet.   Patient transferred to CIR on 05/21/2023 .    Patient currently requires mod with basic self-care skills secondary to decreased cardiorespiratoy endurance, unbalanced muscle activation, motor apraxia, decreased coordination, and decreased motor planning, decreased motor planning and ideational apraxia, decreased initiation, decreased attention, decreased awareness, decreased problem solving, decreased safety awareness, decreased memory, and delayed processing, and decreased sitting balance, decreased standing balance, decreased postural control, decreased balance strategies, and difficulty maintaining precautions.  Prior to hospitalization,  patient could complete BADL with independent .  Patient will benefit from skilled intervention to decrease level of assist with basic self-care  skills and increase independence with basic self-care skills prior to discharge home with care partner.  Anticipate patient will require minimal physical assistance and follow up home health.  OT - End of Session Activity Tolerance: Decreased this session Endurance Deficit: Yes Endurance Deficit Description: Reported fatigue after ~40 min of BADL OT Assessment Rehab Potential (ACUTE ONLY): Good OT Patient demonstrates impairments in the following area(s): Balance;Motor;Sensory;Behavior;Cognition;Perception;Endurance;Safety OT Basic ADL's Functional Problem(s): Bathing;Dressing;Toileting OT Transfers Functional Problem(s): Toilet;Tub/Shower OT Additional Impairment(s): None OT Plan OT Intensity: Minimum of 1-2 x/day, 45 to 90 minutes OT Frequency: 5 out of 7 days OT Duration/Estimated Length of Stay: 7-9 Days OT Treatment/Interventions: Balance/vestibular training;Disease mangement/prevention;Neuromuscular re-education;Patient/family education;Self Care/advanced ADL retraining;Therapeutic Exercise;UE/LE Coordination activities;Cognitive remediation/compensation;Discharge planning;DME/adaptive equipment instruction;Functional mobility training;Therapeutic Activities;UE/LE Strength taining/ROM;Visual/perceptual remediation/compensation OT Self Feeding Anticipated Outcome(s): Independent OT Basic Self-Care Anticipated Outcome(s): Intermittent min assist OT Toileting Anticipated Outcome(s): supervision OT Bathroom Transfers Anticipated Outcome(s): supervision OT Recommendation Patient destination: Home Follow Up Recommendations: Home health OT Equipment Recommended: To be determined   OT Evaluation Precautions/Restrictions  Precautions Precautions: Fall Restrictions Weight Bearing Restrictions Per Provider Order: No   Vital Signs Therapy Vitals Pulse Rate: 94 BP: (!) 141/92 Patient Position (if appropriate): Sitting Oxygen Therapy SpO2: 97 % O2 Device: Room Air Pain Pain  Assessment Pain Scale: 0-10 Pain Score: 0-No pain Home Living/Prior Functioning Home Living Family/patient expects to be discharged to:: Private residence Living Arrangements: Spouse/significant other Available Help at Discharge: Family, Available 24 hours/day Type of Home: House Home Access: Stairs to enter Secretary/administrator of Steps: 4 Entrance Stairs-Rails: Left Home Layout: One level, Multi-level, Able to live on main level with bedroom/bathroom Bathroom Shower/Tub: Health visitor: Handicapped height Bathroom Accessibility: Yes Additional Comments: Uses motorized scooter in home per patient  Lives With: Spouse IADL History Occupation: Retired Prior Education officer, 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 Baseline Vision/History: 0 No visual deficits Ability to See in Adequate Light: 0 Adequate Patient Visual Report: No change from baseline Vision Assessment?: No apparent visual deficits Additional Comments: Not formally assessed - but nothing noted functionally Perception  Perception: Impaired Praxis Praxis: Impaired Praxis Impairment Details: Initiation;Motor planning;Organization;Ideomotor Cognition Cognition Overall Cognitive Status: Impaired/Different from baseline Arousal/Alertness: Awake/alert Orientation Level: Person;Place Memory: Impaired Memory Impairment: Retrieval deficit;Decreased long term memory;Decreased short term memory Decreased Long Term Memory: Verbal basic;Functional basic Decreased Short Term Memory: Verbal basic;Functional basic Attention: Sustained Sustained Attention: Appears intact Awareness: Impaired Awareness Impairment: Intellectual impairment Problem Solving: Impaired Problem Solving Impairment: Functional basic Executive Function: Organizing;Self Monitoring;Self Correcting;Initiating;Sequencing Sequencing: Impaired Sequencing Impairment: Functional basic Organizing: Impaired Organizing Impairment:  Functional basic Initiating: Impaired Initiating Impairment: Functional basic Self Monitoring: Impaired Self Monitoring Impairment: Functional basic Self Correcting: Impaired Self Correcting Impairment: Functional basic Behaviors: Perseveration;Poor frustration tolerance;Impulsive;Restless Safety/Judgment: Impaired Brief Interview for Mental Status (BIMS) Repetition of Three Words (First Attempt): 2 Temporal Orientation: Year: Correct Temporal Orientation: Month: Accurate within 5 days Temporal Orientation: Day: Correct Recall: "Sock": Yes, no cue required Recall: "Blue": Yes, no cue required Recall: "Bed": No, could not recall BIMS Summary Score: 12 Sensation Sensation Light Touch: Appears Intact Hot/Cold: Appears Intact Proprioception: Impaired by gross assessment Stereognosis: Not tested Coordination Gross Motor Movements are Fluid and Coordinated: No Fine Motor Movements are Fluid and Coordinated: No Finger Nose Finger Test: Mild dysmetria left >right Motor  Motor Motor: Abnormal postural alignment and control;Motor impersistence  Trunk/Postural Assessment  Cervical Assessment Cervical Assessment: Exceptions to [facility] (forward head) Thoracic Assessment Thoracic Assessment: Exceptions to [facility] (flexed thoracic spine - mild) Lumbar Assessment Lumbar Assessment: Exceptions to [facility] (posterior rotation of pelvis) Postural Control Postural Control: Deficits on evaluation Protective Responses: delayed in general  Balance Balance Balance Assessed: Yes Static Sitting Balance Static Sitting - Balance Support: Left upper extremity supported;Right upper extremity supported;Feet supported Static Sitting - Level of Assistance: 5: Stand by assistance Dynamic Sitting Balance Dynamic Sitting - Balance Support: Right upper extremity supported;Left upper extremity supported;Feet supported;During functional activity Dynamic Sitting - Level of Assistance: 4: Min assist Static Standing  Balance Static Standing - Balance Support: Right upper extremity supported;Left upper extremity supported;During functional activity Static Standing - Level of Assistance: 4: Min assist Dynamic Standing Balance Dynamic Standing - Balance Support: Right upper extremity supported;Left upper extremity supported;During functional activity Dynamic Standing - Level of Assistance: 4: Min assist Extremity/Trunk Assessment RUE Assessment RUE Assessment: Exceptions to [facility] Active Range of Motion (AROM) Comments: Can raise arm overhead, has full elbow/forearm/hand motion General Strength Comments: 4/5 LUE Assessment LUE Assessment: Exceptions to [facility] Active Range of Motion (AROM) Comments: Old injury to shoulder from fall - can raise arm to ~100*, has full elbow-->hand motion General Strength Comments: 4/5  Care Tool Care Tool Self Care Eating   Eating Assist Level: Minimal Assistance - Patient > 75%    Oral Care    Oral Care Assist Level: Minimal Assistance - Patient > 75%    Bathing   Body parts bathed by patient: Chest;Abdomen;Front perineal area;Buttocks;Right upper leg;Left upper leg;Face Body parts bathed by helper: Right arm;Left arm;Left lower leg;Right lower leg   Assist Level: Moderate Assistance - Patient 50 - 74%    Upper Body Dressing(including orthotics)   What is the patient wearing?: Pull over shirt   Assist Level: Minimal Assistance - Patient > 75%    Lower Body Dressing (excluding footwear)   What is the patient wearing?: Underwear/pull up;Pants Assist for lower body dressing: Minimal Assistance - Patient > 75%    Putting on/Taking off footwear   What is the patient wearing?: Shoes;Ted hose Assist for footwear: Total Assistance - Patient < 25%       Care Tool Toileting Toileting activity   Assist for toileting: Moderate Assistance - Patient 50 - 74%     Care Tool Bed Mobility Roll left and right activity   Roll left and right assist level: Minimal Assistance -  Patient > 75%    Sit to lying activity   Sit to lying assist level: Minimal Assistance - Patient > 75%    Lying to sitting on side of bed activity   Lying to sitting on side of bed assist level: the ability to move from lying on the back to sitting on the side of the bed with no back support.: Minimal Assistance - Patient > 75%     Care Tool Transfers Sit to stand transfer   Sit to stand assist level: Minimal Assistance - Patient > 75%    Chair/bed transfer   Chair/bed transfer assist level: Moderate Assistance - Patient 50 - 74%     Toilet transfer   Assist Level: Moderate Assistance - Patient 50 - 74%     Care Tool Cognition  Expression of Ideas and Wants Expression of Ideas and Wants: 3. Some difficulty - exhibits some difficulty with expressing needs and ideas (e.g, some words or finishing thoughts) or speech is not clear (Simultaneous filing. User may not have  seen previous data.)  Understanding Verbal and Non-Verbal Content Understanding Verbal and Non-Verbal Content: 3. Usually understands - understands most conversations, but misses some part/intent of message. Requires cues at times to understand (Simultaneous filing. User may not have seen previous data.)   Memory/Recall Ability Memory/Recall Ability : That he or she is in a hospital/hospital unit (Simultaneous filing. User may not have seen previous data.)   Refer to Care Plan for Long Term Goals  SHORT TERM GOAL WEEK 1 OT Short Term Goal 1 (Week 1): STG=LTG due to LOS  Recommendations for other services: None    Skilled Therapeutic Intervention: Patient received seated in wheelchair attempting to pull himself toward the bathroom.  Patient able to indicate he was at [facility].  Patient's responses were delayed often, and also often single syllable in nature.  Patient pleasant and easily directed to task.  Agreeable to shower.  Needing cueing for each logical next step of shower- but physically requiring little  help.  Patient moves slowly in stand to sit consistently.  Patient returned to bed at end if session for Chest Xray.  Direct hand off to nurse and transporter.   ADL ADL Eating: Minimal assistance Where Assessed-Eating: Chair Grooming: Minimal assistance Where Assessed-Grooming: Sitting at sink Upper Body Bathing: Minimal assistance Where Assessed-Upper Body Bathing: Shower Lower Body Bathing: Moderate assistance Where Assessed-Lower Body Bathing: Shower Upper Body Dressing: Minimal assistance Where Assessed-Upper Body Dressing: Sitting at sink Lower Body Dressing: Maximal assistance Where Assessed-Lower Body Dressing: Sitting at sink;Standing at sink Toileting: Moderate assistance Where Assessed-Toileting: Teacher, adult education: Curator Method: Surveyor, minerals: Engineer, technical sales: Unable to Engineer, technical sales: Insurance underwriter Method: Warden/ranger: Emergency planning/management officer ADL Comments: Patient maintains one hand at all times in support in sitting and standing Mobility  Bed Mobility Bed Mobility: Sit to Supine;Rolling Right;Rolling Left;Supine to Sit Rolling Right: Minimal Assistance - Patient > 75% Rolling Left: Minimal Assistance - Patient > 75% Supine to Sit: Moderate Assistance - Patient 50-74% Sit to Supine: Moderate Assistance - Patient 50-74% Transfers Sit to Stand: Minimal Assistance - Patient > 75% Stand to Sit: Minimal Assistance - Patient > 75%   Discharge Criteria: Patient will be discharged from OT if patient refuses treatment 3 consecutive times without medical reason, if treatment goals not met, if there is a change in medical status, if patient makes no progress towards goals or if patient is discharged from hospital.  The above assessment, treatment plan, treatment alternatives and goals were discussed and mutually agreed upon: by  patient  Collier Salina 05/22/2023, 12:21 PM

## 2023-05-22 NOTE — Plan of Care (Signed)
  Problem: RH Balance Goal: LTG: Patient will maintain dynamic sitting balance (OT) Description: LTG:  Patient will maintain dynamic sitting balance with assistance during activities of daily living (OT) Flowsheets (Taken 05/22/2023 1232) LTG: Pt will maintain dynamic sitting balance during ADLs with: Independent Goal: LTG Patient will maintain dynamic standing with ADLs (OT) Description: LTG:  Patient will maintain dynamic standing balance with assist during activities of daily living (OT)  Flowsheets (Taken 05/22/2023 1232) LTG: Pt will maintain dynamic standing balance during ADLs with: Supervision/Verbal cueing   Problem: Sit to Stand Goal: LTG:  Patient will perform sit to stand in prep for activites of daily living with assistance level (OT) Description: LTG:  Patient will perform sit to stand in prep for activites of daily living with assistance level (OT) Flowsheets (Taken 05/22/2023 1232) LTG: PT will perform sit to stand in prep for activites of daily living with assistance level: Supervision/Verbal cueing   Problem: RH Dressing Goal: LTG Patient will perform upper body dressing (OT) Description: LTG Patient will perform upper body dressing with assist, with/without cues (OT). Flowsheets (Taken 05/22/2023 1232) LTG: Pt will perform upper body dressing with assistance level of: Set up assist Goal: LTG Patient will perform lower body dressing w/assist (OT) Description: LTG: Patient will perform lower body dressing with assist, with/without cues in positioning using equipment (OT) Flowsheets (Taken 05/22/2023 1232) LTG: Pt will perform lower body dressing with assistance level of: Minimal Assistance - Patient > 75%   Problem: RH Toileting Goal: LTG Patient will perform toileting task (3/3 steps) with assistance level (OT) Description: LTG: Patient will perform toileting task (3/3 steps) with assistance level (OT)  Flowsheets (Taken 05/22/2023 1232) LTG: Pt will perform toileting task  (3/3 steps) with assistance level: Supervision/Verbal cueing   Problem: RH Attention Goal: LTG Patient will demonstrate this level of attention during functional activites (OT) Description: LTG:  Patient will demonstrate this level of attention during functional activites  (OT) Flowsheets (Taken 05/22/2023 1232) Patient will demonstrate this level of attention during functional activites: Selective Patient will demonstrate above attention level in the following environment: Home LTG: Patient will demonstrate this level of attention during functional activites (OT): Minimal Assistance - Patient > 75%   Problem: RH Awareness Goal: LTG: Patient will demonstrate awareness during functional activites type of (OT) Description: LTG: Patient will demonstrate awareness during functional activites type of (OT) Flowsheets (Taken 05/22/2023 1232) Patient will demonstrate awareness during functional activites type of: Emergent LTG: Patient will demonstrate awareness during functional activites type of (OT): Minimal Assistance - Patient > 75%

## 2023-05-22 NOTE — Discharge Summary (Signed)
Physician Discharge Summary  Patient ID: Jared [name]. MRN: [id number] DOB/AGE: 05-Dec-1940 83 y.o.  Admit date: 05/21/2023 Discharge date: 06/03/2023  Discharge Diagnoses:  Principal Problem:   Cardioembolic stroke [facility]) DVT prophylaxis Hyperlipidemia Left CCA stenosis Hypertension Neuropathy related to chemotherapy from remote colon cancer Previous NPH workup/cognitive impairment E Coli UTI   Discharged Condition: Stable  Significant Diagnostic Studies: DG Chest 2 View Result Date: 05/22/2023 CLINICAL DATA:  Fever. EXAM: CHEST - 2 VIEW COMPARISON:  05/15/2023 FINDINGS: Poor inspiration. Normal sized heart. Tortuous aorta. Clear lungs with crowding of the pulmonary vasculature and lung markings. Loop recorder in place. Left shoulder prosthesis. Thoracic spine degenerative changes. IMPRESSION: Poor inspiration. No acute abnormality. Electronically Signed   By: Beckie Salts M.D.   On: 05/22/2023 15:08   EP PPM/ICD IMPLANT Result Date: 05/19/2023 CONCLUSIONS:  1. Successful implantation of a implantable loop recorder for a history of cryptogenic stroke  2. No early apparent complications. Graciella Freer, PA-C Cardiac Electrophysiology   VAS [place] LOWER EXTREMITY VENOUS (DVT) Result Date: 05/16/2023  Lower Venous DVT Study Patient Name:  Jared [name].  Date of Exam:   05/16/2023 Medical Rec #: [id number]         Accession #:    [id number] Date of Birth: 19-Sep-1940        Patient Gender: M Patient Age:   83 years Exam Location:  [facility] Procedure:      VAS [place] LOWER EXTREMITY VENOUS (DVT) Referring Phys: ANAND HONGALGI --------------------------------------------------------------------------------  Indications: Stroke, and Embolic stroke.  Risk Factors: Cancer colon. Comparison Study: No prior exam. Performing Technologist: Fernande Bras  Examination Guidelines: A complete evaluation includes B-mode imaging, spectral Doppler, color Doppler, and power Doppler as  needed of all accessible portions of each vessel. Bilateral testing is considered an integral part of a complete examination. Limited examinations for reoccurring indications may be performed as noted. The reflux portion of the exam is performed with the patient in reverse Trendelenburg.  +---------+---------------+---------+-----------+----------+--------------+ RIGHT    CompressibilityPhasicitySpontaneityPropertiesThrombus Aging +---------+---------------+---------+-----------+----------+--------------+ CFV      Full           Yes      Yes                                 +---------+---------------+---------+-----------+----------+--------------+ SFJ      Full           Yes      Yes                                 +---------+---------------+---------+-----------+----------+--------------+ FV Prox  Full                                                        +---------+---------------+---------+-----------+----------+--------------+ FV Mid   Full                                                        +---------+---------------+---------+-----------+----------+--------------+ FV DistalFull                                                        +---------+---------------+---------+-----------+----------+--------------+  PFV      Full                                                        +---------+---------------+---------+-----------+----------+--------------+ POP      Full           Yes      Yes                                 +---------+---------------+---------+-----------+----------+--------------+ PTV      Full                                                        +---------+---------------+---------+-----------+----------+--------------+ PERO     Full                                                        +---------+---------------+---------+-----------+----------+--------------+    +---------+---------------+---------+-----------+----------+--------------+ LEFT     CompressibilityPhasicitySpontaneityPropertiesThrombus Aging +---------+---------------+---------+-----------+----------+--------------+ CFV      Full           Yes      Yes                                 +---------+---------------+---------+-----------+----------+--------------+ SFJ      Full           Yes      Yes                                 +---------+---------------+---------+-----------+----------+--------------+ FV Prox  Full                                                        +---------+---------------+---------+-----------+----------+--------------+ FV Mid   Full                                                        +---------+---------------+---------+-----------+----------+--------------+ FV DistalFull                                                        +---------+---------------+---------+-----------+----------+--------------+ PFV      Full                                                        +---------+---------------+---------+-----------+----------+--------------+  POP      Full           Yes      Yes                                 +---------+---------------+---------+-----------+----------+--------------+ PTV      Full                                                        +---------+---------------+---------+-----------+----------+--------------+ PERO     Full                                                        +---------+---------------+---------+-----------+----------+--------------+     Summary: BILATERAL: - No evidence of deep vein thrombosis seen in the lower extremities, bilaterally. -No evidence of popliteal cyst, bilaterally.   *See table(s) above for measurements and observations. Electronically signed by Lemar Livings MD on 05/16/2023 at 7:17:54 PM.    Final    ECHOCARDIOGRAM COMPLETE Result Date: 05/16/2023     ECHOCARDIOGRAM REPORT   Patient Name:   Jared [name]. Date of Exam: 05/16/2023 Medical Rec #:  [id number]        Height:       70.0 in Accession #:    [id number]       Weight:       173.9 lb Date of Birth:  09/05/1940       BSA:          1.967 m Patient Age:    83 years         BP:           126/86 mmHg Patient Gender: M                HR:           78 bpm. Exam Location:  Inpatient Procedure: 2D Echo, Color Doppler and Cardiac Doppler Indications:   Stroke I63.9  History:       Patient has no prior history of Echocardiogram examinations.                Stroke.  Sonographer:   Harriette Bouillon RDCS Referring      [id number] ANAND D HONGALGI Phys: IMPRESSIONS  1. Left ventricular ejection fraction, by estimation, is 70 to 75%. The left ventricle has hyperdynamic function. The left ventricle has no regional wall motion abnormalities. Left ventricular diastolic parameters are consistent with Grade I diastolic dysfunction (impaired relaxation).  2. Right ventricular systolic function is normal. The right ventricular size is normal.  3. The mitral valve is normal in structure. No evidence of mitral valve regurgitation. No evidence of mitral stenosis.  4. The aortic valve is tricuspid. There is moderate calcification of the aortic valve. Aortic valve regurgitation is not visualized. Aortic valve sclerosis/calcification is present, without any evidence of aortic stenosis.  5. Aortic dilatation noted. There is borderline dilatation of the aortic root, measuring 38 mm.  6. The inferior vena cava is normal in size with greater than 50% respiratory variability, suggesting right atrial pressure of 3 mmHg. FINDINGS  Left Ventricle:  Left ventricular ejection fraction, by estimation, is 70 to 75%. The left ventricle has hyperdynamic function. The left ventricle has no regional wall motion abnormalities. The left ventricular internal cavity size was normal in size. There is no left ventricular hypertrophy. Left ventricular diastolic  parameters are consistent with Grade I diastolic dysfunction (impaired relaxation). Right Ventricle: The right ventricular size is normal. No increase in right ventricular wall thickness. Right ventricular systolic function is normal. Left Atrium: Left atrial size was normal in size. Right Atrium: Right atrial size was normal in size. Pericardium: There is no evidence of pericardial effusion. Mitral Valve: The mitral valve is normal in structure. No evidence of mitral valve regurgitation. No evidence of mitral valve stenosis. Tricuspid Valve: The tricuspid valve is normal in structure. Tricuspid valve regurgitation is trivial. No evidence of tricuspid stenosis. Aortic Valve: The aortic valve is tricuspid. There is moderate calcification of the aortic valve. Aortic valve regurgitation is not visualized. Aortic valve sclerosis/calcification is present, without any evidence of aortic stenosis. Pulmonic Valve: The pulmonic valve was normal in structure. Pulmonic valve regurgitation is not visualized. No evidence of pulmonic stenosis. Aorta: Aortic dilatation noted. There is borderline dilatation of the aortic root, measuring 38 mm. Venous: The inferior vena cava is normal in size with greater than 50% respiratory variability, suggesting right atrial pressure of 3 mmHg. IAS/Shunts: No atrial level shunt detected by color flow Doppler.  LEFT VENTRICLE PLAX 2D LVIDd:         4.50 cm   Diastology LVIDs:         3.00 cm   LV e' medial:    6.42 cm/s LV PW:         1.00 cm   LV E/e' medial:  7.5 LV IVS:        1.10 cm   LV e' lateral:   4.46 cm/s LVOT diam:     2.30 cm   LV E/e' lateral: 10.8 LV SV:         66 LV SV Index:   33 LVOT Area:     4.15 cm  RIGHT VENTRICLE             IVC RV S prime:     20.60 cm/s  IVC diam: 1.80 cm TAPSE (M-mode): 2.3 cm LEFT ATRIUM           Index LA diam:      3.50 cm 1.78 cm/m LA Vol (A4C): 41.1 ml 20.89 ml/m  AORTIC VALVE LVOT Vmax:   82.90 cm/s LVOT Vmean:  60.400 cm/s LVOT VTI:    0.158 m   AORTA Ao Root diam: 3.80 cm Ao Asc diam:  3.60 cm MITRAL VALVE MV Area (PHT): 2.97 cm    SHUNTS MV E velocity: 48.00 cm/s  Systemic VTI:  0.16 m MV A velocity: 78.00 cm/s  Systemic Diam: 2.30 cm MV E/A ratio:  0.62 Arvilla Meres MD Electronically signed by Arvilla Meres MD Signature Date/Time: 05/16/2023/11:02:12 AM    Final    CT ANGIO HEAD NECK W WO CM Result Date: 05/15/2023 CLINICAL DATA:  Left parietal, right frontal, and right occipital infarcts on same-day MRI, determine embolic source EXAM: CT ANGIOGRAPHY HEAD AND NECK WITH AND WITHOUT CONTRAST TECHNIQUE: Multidetector CT imaging of the head and neck was performed using the standard protocol during bolus administration of intravenous contrast. Multiplanar CT image reconstructions and MIPs were obtained to evaluate the vascular anatomy. Carotid stenosis measurements (when applicable) are obtained utilizing NASCET criteria, using the distal  internal carotid diameter as the denominator. RADIATION DOSE REDUCTION: This exam was performed according to the departmental dose-optimization program which includes automated exposure control, adjustment of the mA and/or kV according to patient size and/or use of iterative reconstruction technique. CONTRAST:  75mL OMNIPAQUE IOHEXOL 350 MG/ML SOLN COMPARISON:  05/15/2023 CT head, 05/15/2023 MRI head, no prior CTA available FINDINGS: CT HEAD FINDINGS For noncontrast findings, please see same day CT head. CTA NECK FINDINGS Aortic arch: Variant anatomy, with a common origin of the brachiocephalic and left common carotid artery, and the left vertebral artery originating from the aorta. Imaged portion shows no evidence of aneurysm or dissection. Severe stenosis at the origin of the left vertebral artery. Right carotid system: No evidence of dissection, occlusion, or hemodynamically significant stenosis (greater than 50%). Atherosclerotic disease at the bifurcation and in the proximal ICA is not hemodynamically  significant. Left carotid system: Approximately 60% stenosis in the distal left CCA, secondary to noncalcified plaque primarily (series 5, image 215 and series 8, image 118), with an area of focal plaque ulceration (series 5, image 216). No hemodynamically significant stenosis in the left ICA or ECA. No evidence of dissection. Vertebral arteries: Severe stenosis at the takeoff of the left vertebral artery from the aorta. The left vertebral artery is otherwise patent to the skull base, without significant stenosis. Right dominant system. The right vertebral artery is patent from its origin to the skull base, without significant stenosis. No evidence of dissection. Skeleton: No acute osseous abnormality. Degenerative changes in the cervical spine. Other neck: No acute finding. Upper chest: No focal pulmonary opacity or pleural effusion. Review of the MIP images confirms the above findings CTA HEAD FINDINGS Anterior circulation: Both internal carotid arteries are patent to the termini, with calcifications but without significant stenosis. A1 segments patent. Normal anterior communicating artery. Anterior cerebral arteries are patent to their distal aspects without significant stenosis. No M1 stenosis or occlusion. MCA branches perfused to their distal aspects without significant stenosis. Posterior circulation: Vertebral arteries patent to the vertebrobasilar junction without significant stenosis. Posterior inferior cerebellar arteries patent proximally. Basilar patent to its distal aspect without significant stenosis. Superior cerebellar arteries patent proximally. Patent P1 segments. PCAs perfused to their distal aspects without significant stenosis. The bilateral posterior communicating arteries are not visualized. Venous sinuses: As permitted by contrast timing, patent. Anatomic variants: None significant. No evidence of aneurysm or vascular malformation. Review of the MIP images confirms the above findings  IMPRESSION: 1. No intracranial large vessel occlusion or significant stenosis. 2. Approximately 60% stenosis in the distal left CCA, secondary to noncalcified plaque, with an area of focal plaque ulceration. 3. Severe stenosis at the takeoff of the left vertebral artery from the aorta. Electronically Signed   By: Wiliam Ke M.D.   On: 05/15/2023 17:51   MR BRAIN WO CONTRAST Result Date: 05/15/2023 CLINICAL DATA:  Mental status change, unknown cause EXAM: MRI HEAD WITHOUT CONTRAST TECHNIQUE: Multiplanar, multiecho pulse sequences of the brain and surrounding structures were obtained without intravenous contrast. COMPARISON:  Same day CT head. FINDINGS: Brain: Small acute or early subacute infarcts in the left parietal and right frontal and occipital lobes. No substantial mass effect. Advanced T2/FLAIR hyperintensities in the white matter compatible with chronic microvascular ischemic change. Cerebral atrophy. No evidence of acute hemorrhage, mass lesion, midline shift or hydrocephalus. Vascular: Major arterial flow voids are maintained at the skull base. Skull and upper cervical spine: Normal marrow signal. Sinuses/Orbits: Mild-to-moderate paranasal sinus mucosal thickening. No acute orbital findings.  Other: Small right mastoid effusion. IMPRESSION: 1. Small acute or early subacute infarcts in the left parietal and right frontal and occipital lobes. Given involvement of multiple vascular territories, consider an embolic etiology. 2. Advanced chronic microvascular ischemic change and cerebral atrophy (ICD10-G31.9). Electronically Signed   By: Feliberto Harts M.D.   On: 05/15/2023 15:25   CT Head Wo Contrast Result Date: 05/15/2023 CLINICAL DATA:  Altered mental status. EXAM: CT HEAD WITHOUT CONTRAST TECHNIQUE: Contiguous axial images were obtained from the base of the skull through the vertex without intravenous contrast. RADIATION DOSE REDUCTION: This exam was performed according to the departmental  dose-optimization program which includes automated exposure control, adjustment of the mA and/or kV according to patient size and/or use of iterative reconstruction technique. COMPARISON:  MRI brain dated March 08, 2021. FINDINGS: Brain: No evidence of acute infarction, hemorrhage, hydrocephalus, extra-axial collection or mass lesion/mass effect. Stable advanced atrophy and chronic microvascular ischemic changes. Vascular: Calcified atherosclerosis at the skull base. No hyperdense vessel. Skull: Normal. Negative for fracture or focal lesion. Sinuses/Orbits: No acute finding. Other: None. IMPRESSION: 1. No acute intracranial abnormality. 2. Stable advanced atrophy and chronic microvascular ischemic changes. Electronically Signed   By: Obie Dredge M.D.   On: 05/15/2023 11:43   DG Chest Port 1 View Result Date: 05/15/2023 CLINICAL DATA:  Altered mental status. EXAM: PORTABLE CHEST 1 VIEW COMPARISON:  None Available. FINDINGS: The heart size and mediastinal contours are within normal limits. Status post left total shoulder arthroplasty. Right lung is clear. Mild left basilar atelectasis or infiltrate is noted with small left pleural effusion. IMPRESSION: Mild left basilar subsegmental atelectasis or infiltrate is noted with small left pleural effusion. Followup PA and lateral chest X-ray is recommended in 3-4 weeks following trial of antibiotic therapy to ensure resolution and exclude underlying malignancy. Electronically Signed   By: Lupita Raider M.D.   On: 05/15/2023 11:30    Labs:  Basic Metabolic Panel: Recent Labs  Lab 05/22/23 0618 05/26/23 0521  NA 135 135  K 4.2 4.3  CL 102 103  CO2 24 25  GLUCOSE 148* 156*  BUN 15 19  CREATININE 1.17 0.96  CALCIUM 8.8* 8.9    CBC: Recent Labs  Lab 05/22/23 0618 05/23/23 0647 05/26/23 0521  WBC 19.3* 10.5 7.2  NEUTROABS 16.0*  --   --   HGB 13.8 13.8 14.0  HCT 40.8 40.6 41.3  MCV 90.3 88.6 89.0  PLT 257 238 316    CBG: No results for  input(s): "GLUCAP" in the last 168 hours.  Family history.  Mother with Hodgkin's lymphoma father with heart failure.  Denies any colon cancer esophageal cancer or rectal cancer  Brief HPI:   Jared [name]. is a 83 y.o. 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  Per chart review lives with spouse.  Multilevel home.  Bed and bath on main level.  Uses electric wheelchair for mobility in the home furniture service and bathroom.  Rollator to get to the car.  He has a home health aide 2 times a week.  Presented 05/15/2023 with increasing confusion and garbled speech.  CT/MRI showed small acute/early subacute infarcts in the left parietal and right frontal occipital lobe.  CTA with no intracranial large vessel occlusion or significant stenosis.  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  Patient had an  outpatient MRI at the [place] [id number] 24 for ataxia and gait abnormality showed no acute abnormality at that time concern for possible NPH with prior lacunar infarct.  Underwent an LP to further evaluate for NPH.  Opening pressure was not in chart review labs unremarkable.  Patient had a neurology visit June 2024 with Dr. Vickey Huger for evaluation of gait difficulty.  Appointment note states that neuropathy started while on chemotherapy in 2019 walking difficulties in 2019 began with some falls in 2021.  Admission chemistries unremarkable.  Echocardiogram with ejection fraction of 70 to 75%.  Grade 1 diastolic dysfunction.  Neurology workup placed on low-dose aspirin 81 mg daily and Plavix 75 mg daily for CVA prophylaxis x 3 weeks then aspirin alone.  Lovenox added for DVT prophylaxis.  CVAs felt to be likely cardioembolic.  Patient did undergo loop recorder placed on 05/19/2023 for monitoring  of atrial fibrillation per Dr. Nobie Putnam.  In regards to left CCA stenosis noted on CTA workup outpatient as needed.  Therapy evaluations completed due to patient decreased functional mobility was admitted for a comprehensive rehab program.   Hospital Course: Jared [name]. was admitted to rehab 05/21/2023 for inpatient therapies to consist of PT, ST and OT at least three hours five days a week. Past admission physiatrist, therapy team and rehab RN have worked together to provide customized collaborative inpatient rehab.  Pertaining to patient's right occipital and right frontal as well as left parietal lobe infarction likely cardioembolic.  Status post loop recorder placement 05/19/2023.  Patient would remain on low-dose aspirin and Plavix x 3 weeks then aspirin alone.  Lovenox for DVT prophylaxis venous Doppler studies negative.  Crestor ongoing for hyperlipidemia.  Previous NPH workup/cognitive impairment follow-up outpatient neurology service Dr. Vickey Huger with B12, TSH, ESR, RPR normal.  Left CCA stenosis again as noted patient would follow-up outpatient consider vascular consult.  Blood pressure controlled long-term goal normotensive monitored with increased mobility follow-up outpatient.  Neuropathy related to chemotherapy from remote colon cancer patient was using an electric wheelchair in the home prior to admission.UTI/E Coli and was on keflex x 10 doses.   Blood pressures were monitored on TID basis and controlled      Rehab course: During patient's stay in rehab weekly team conferences were held to monitor patient's progress, set goals and discuss barriers to discharge. At admission, patient required minimal assist 85 feet rolling walker contact-guard sit to stand  Physical exam.  Blood pressure 143/86 pulse 86 temperature 98.7 respirations 20 oxygen saturation is 96% room air Constitutional.  No acute distress HEENT Head.  Normocephalic and atraumatic Eyes.  Pupils round and reactive  to light no discharge without nystagmus Neck.  Supple nontender no JVD without thyromegaly Cardiac regular rate and rhythm without any extra sounds or murmur heard Abdomen.  Soft nontender positive bowel sounds without rebound Respiratory effort normal no respiratory distress without wheeze Musculoskeletal. Right upper extremity and left upper extremity 5/5 Right lower extremity hip flexors 4+/5 otherwise 5/5 Left lower extremity hip flexors 4+/5 otherwise 5/5 Neurologic.  Alert oriented memory fair intermittent delayed speech limited historian.  He/She  has had improvement in activity tolerance, balance, postural control as well as ability to compensate for deficits. He/She has had improvement in functional use RUE/LUE  and RLE/LLE as well as improvement in awareness.  Ambulating 250 feet using rolling walker contact-guard.  Working with energy conservation techniques.  Patient completed toileting, showering and dressing standing at sink for over 5 minutes with supervision  to complete oral care and hair care.  Will assist for lower body dressing.  Full family teaching completed plan discharge to home       Disposition:  There are no questions and answers to display.         Diet: Regular  Special Instructions: No driving smoking or alcohol  Medications at discharge. 1.  Tylenol as needed 2.  Aspirin 81 mg p.o. daily 3.  Plavix 75 mg p.o. daily until 06/04/2023 and stop 4.  MiraLAX daily hold for loose stools 5.  Crestor 20 mg p.o. daily 6.  Senokot S1 tablet p.o. twice daily 7.  Topamax 25 mg nightly 8.Keflex 500 mg every 12 hours until 06/03/2023  30-35 minutes were spent completing discharge summary and discharge planning  Discharge Instructions     Ambulatory referral to Neurology   Complete by: As directed    An appointment is requested in approximately: 4 weeks cardioembolic CVA   Ambulatory referral to Physical Medicine Rehab   Complete by: As directed    Medical  complexity follow-up 1 to 2 weeks cardioembolic CVA        Follow-up Information     Kirsteins, Victorino Sparrow, MD Follow up.   Specialty: Physical Medicine and Rehabilitation Why: Office to call for appointment Contact information: [address] [place] [address] [phone]         [name], Porfirio Mylar, MD Follow up.   Specialty: Neurology Why: Call for appointment Contact information: [address] [place] [address] [phone]         Nobie Putnam, MD Follow up.   Specialties: Cardiology, Radiology Why: Call for appointment Contact information: [address] [place] [place] [address] [phone]                 Signed: Charlton Amor 05/28/2023, 10:59 AM

## 2023-05-22 NOTE — Progress Notes (Signed)
PROGRESS NOTE   Subjective/Complaints: Pt up in his chair. Just finished with SLP. No complaints. Slept very well.   ROS: Patient denies fever, rash, sore throat, blurred vision, dizziness, nausea, vomiting, diarrhea, cough, shortness of breath or chest pain, joint or back/neck pain, headache, or mood change.    Objective:   No results found. Recent Labs    05/22/23 0618  WBC 19.3*  HGB 13.8  HCT 40.8  PLT 257   Recent Labs    05/22/23 0618  NA 135  K 4.2  CL 102  CO2 24  GLUCOSE 148*  BUN 15  CREATININE 1.17  CALCIUM 8.8*    Intake/Output Summary (Last 24 hours) at 05/22/2023 1024 Last data filed at 05/22/2023 0816 Gross per 24 hour  Intake 940 ml  Output 100 ml  Net 840 ml        Physical Exam: Vital Signs Blood pressure (!) 146/87, pulse 85, temperature 97.9 F (36.6 C), resp. rate 17, height 5\' 10"  (1.778 m), weight 75.9 kg, SpO2 94%.  General: Alert and oriented x 3, No apparent distress, in w/c with wb HEENT: Head is normocephalic, atraumatic, PERRLA, EOMI, sclera anicteric, oral mucosa pink and moist, dentition intact, ext ear canals clear,  Neck: Supple without JVD or lymphadenopathy Heart: Reg rate and rhythm. No murmurs rubs or gallops Chest: CTA bilaterally without wheezes, rales, or rhonchi; no distress Abdomen: Soft, non-tender, non-distended, bowel sounds positive. Extremities: No clubbing, cyanosis, or edema. Pulses are 2+ Psych: Pt's affect is appropriate. Pt is cooperative Skin: Clean and intact without signs of breakdown Neuro:  Alert and oriented x 3. Fair insight and awareness and memory. Normal language. Speech a little delayed. Cranial nerve exam unremarkable. MMT: 5/5 in BUE and 4/5>4+/5 in LE. Sensory exam normal for light touch and pain in all 4 limbs. No limb ataxia or cerebellar signs. No abnormal tone appreciated.  .   Musculoskeletal: Full ROM, No pain with AROM or PROM in the  neck, trunk, or extremities. Posture appropriate     Assessment/Plan: 1. Functional deficits which require 3+ hours per day of interdisciplinary therapy in a comprehensive inpatient rehab setting. Physiatrist is providing close team supervision and 24 hour management of active medical problems listed below. Physiatrist and rehab team continue to assess barriers to discharge/monitor patient progress toward functional and medical goals  Care Tool:  Bathing              Bathing assist       Upper Body Dressing/Undressing Upper body dressing        Upper body assist      Lower Body Dressing/Undressing Lower body dressing            Lower body assist       Toileting Toileting    Toileting assist       Transfers Chair/bed transfer  Transfers assist           Locomotion Ambulation   Ambulation assist              Walk 10 feet activity   Assist           Walk 50 feet activity  Assist           Walk 150 feet activity   Assist           Walk 10 feet on uneven surface  activity   Assist           Wheelchair     Assist               Wheelchair 50 feet with 2 turns activity    Assist            Wheelchair 150 feet activity     Assist          Blood pressure (!) 146/87, pulse 85, temperature 97.9 F (36.6 C), resp. rate 17, height 5\' 10"  (1.778 m), weight 75.9 kg, SpO2 94%.  Medical Problem List and Plan: 1. 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  Status post loop recorder placement 05/19/2023             -patient may  shower             -ELOS/Goals: 7-10 Sup PT/OT/SLP            -Patient is beginning CIR therapies today including PT, OT, and SLP  2.  Antithrombotics: -DVT/anticoagulation:  Pharmaceutical: Lovenox.  Venous Doppler studies negative             -antiplatelet therapy: Aspirin 81 mg  daily and Plavix 75 mg day x 3 weeks then aspirin alone 3. Pain Management: Tylenol as needed 4. Mood/Behavior/Sleep: Provide emotional support             -antipsychotic agents: N/A 5. Neuropsych/cognition: This patient may be capable of making decisions on his own behalf. 6. Skin/Wound Care: Routine skin checks 7. Fluids/Electrolytes/Nutrition: encourage PO  I personally reviewed the patient's labs today.    -protein supp for low albumin 8.  Hyperlipidemia.  Crestor 9.  Previous NPH workup/cognitive impairment.  Followed outpatient by neurology service Dr. Vickey Huger.  B12, TSH, ESR, RPR normal. -Continue f/u with Dr. Vickey Huger and [facility] neurology as outpatient -Uses Wheelchair/Scooter at home 10.  Left CCA stenosis.  CTA head and neck 60% stenosis distal left CCA secondary to plaque with ulceration. -Outpatient f/u with vascular surgery 11.  Hypertension.  Long term goal normotensive. Monitor with increased mobility.  Patient on no antihypertensive medications prior to admission.  -bp borderline today--monitor for patern 12.  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  Follow-up outpatient 13. Leukocytosis: 19k today, no clinical signs of infection except for low grade temp yesterday evening  -check ua and ucx  -most recent cxr with ?effusion--check cxr today  -f/u labwork in am    LOS: 1 days A FACE TO FACE EVALUATION WAS PERFORMED  Ranelle Oyster 05/22/2023, 10:24 AM

## 2023-05-23 DIAGNOSIS — I639 Cerebral infarction, unspecified: Secondary | ICD-10-CM | POA: Diagnosis not present

## 2023-05-23 DIAGNOSIS — E440 Moderate protein-calorie malnutrition: Secondary | ICD-10-CM | POA: Diagnosis not present

## 2023-05-23 DIAGNOSIS — D72823 Leukemoid reaction: Secondary | ICD-10-CM | POA: Diagnosis not present

## 2023-05-23 DIAGNOSIS — I10 Essential (primary) hypertension: Secondary | ICD-10-CM | POA: Diagnosis not present

## 2023-05-23 LAB — CBC
HCT: 40.6 % (ref 39.0–52.0)
Hemoglobin: 13.8 g/dL (ref 13.0–17.0)
MCH: 30.1 pg (ref 26.0–34.0)
MCHC: 34 g/dL (ref 30.0–36.0)
MCV: 88.6 fL (ref 80.0–100.0)
Platelets: 238 10*3/uL (ref 150–400)
RBC: 4.58 MIL/uL (ref 4.22–5.81)
RDW: 12.1 % (ref 11.5–15.5)
WBC: 10.5 10*3/uL (ref 4.0–10.5)
nRBC: 0 % (ref 0.0–0.2)

## 2023-05-23 LAB — URINALYSIS, W/ REFLEX TO CULTURE (INFECTION SUSPECTED)
Bilirubin Urine: NEGATIVE
Glucose, UA: NEGATIVE mg/dL
Ketones, ur: NEGATIVE mg/dL
Nitrite: NEGATIVE
Protein, ur: 30 mg/dL — AB
RBC / HPF: >50 RBC/hpf (ref 0–5)
Specific Gravity, Urine: 1.02 (ref 1.005–1.030)
WBC, UA: >50 WBC/hpf (ref 0–5)
pH: 7 (ref 5.0–8.0)

## 2023-05-23 NOTE — Progress Notes (Signed)
Occupational Therapy Session Note  Patient Details  Name: Jared [name]. MRN: [id number] Date of Birth: 01/11/1941  Today's Date: 05/23/2023 OT Individual Time: 1324-4010 OT Individual Time Calculation (min): 70 min    Short Term Goals: Week 1:  OT Short Term Goal 1 (Week 1): STG=LTG due to LOS  Skilled Therapeutic Interventions/Progress Updates:  Pt greeted in w/c with NT present, pt agreeable to OT intervention.      Transfers/bed mobility/functional mobility: worked on functional ambulation with RW with wife present. Wife reports that his walking pattern is close to baseline. Worked on increasing stride length with visual aid provided on Rw to "kick" theraband during swing. Pts gait pattern did improve with visual aid however pt continued to required MOD verbal cues to carryover technique.   Therapeutic activity: pt completed functional ambulation task with pt instructed to ambulate ~ 6 ft to transport dots to matching cones however pt presents with very short shuffled gait needing to sit after one round of 6 ft.    Worked on targeted stepping and increasing step length with pt instructed to step to dots on floor with BUE support from Rw, pt completed task with CGA.  Graded task up with pt instructed to complete toe taps to yoga block with an emphasis on facilitating improved knee flexion to be carried over during functional gait. Pt completed task with BUE support and CGA for 1 min of alternating stepping.   Pt completed reciprocal stepping up on 3 inch step with BUE support from Rw, pt completed task with MIN A and MIN verbal cues for sequencing of task.    ADLs:   LB dressing: donned new brief in standing with total A for time mgmt   Transfers: pt completed ambulatory transfer into bathroom with RW and MINA, MIN verbal cues needed for stride length and RW mgmt. Pt required MAX cues to back up to toilet d/t impaired motor planning.    Ended session with pt on toilet with NT  present.   Therapy Documentation Precautions:  Precautions Precautions: Other (comment), Fall Precaution Comments: lack of wt shift w/ shuffled gait and anterior lean; very delayed processing for all tasks Restrictions Weight Bearing Restrictions Per Provider Order: No  Pain: No pain    Therapy/Group: Individual Therapy  [name] [facility] 05/23/2023, 3:05 PM

## 2023-05-23 NOTE — Plan of Care (Signed)
  Problem: Consults Goal: RH STROKE PATIENT EDUCATION Description: See Patient Education module for education specifics  Outcome: Progressing   Problem: RH BOWEL ELIMINATION Goal: RH STG MANAGE BOWEL WITH ASSISTANCE Description: STG Manage Bowel with medications  Outcome: Progressing   Problem: RH BLADDER ELIMINATION Goal: RH STG MANAGE BLADDER WITH ASSISTANCE Description: STG Manage Bladder With  toileting  Outcome: Progressing

## 2023-05-23 NOTE — Progress Notes (Signed)
Patient ID: Jared [name]., male   DOB: December 17, 1940, 83 y.o.   MRN: [id number] Met with the patient to review current medical situation, rehab schedule, team conference and plan of care. Reviewed secondary risk management including HTN, HLD (LDL 124/Trig 90 ) on Crestor, DM (A1C 6.0). Reviewed DAPT x 3 mths then ASA solo.  Reports constipation addressed. Reviewed home with wife (sciatica) and pt reports using a scooter at home PTA. Continue to follow along to address educational needs to facilitate preparation for discharge. Pamelia Hoit

## 2023-05-23 NOTE — Progress Notes (Signed)
Inpatient Rehabilitation Care Coordinator Assessment and Plan Patient Details  Name: Jared [name]. MRN: [id number] Date of Birth: 05-11-1940  Today's Date: 05/23/2023  Hospital Problems: Principal Problem:   Cardioembolic stroke [facility])  Past Medical History:  Past Medical History:  Diagnosis Date   colon cancer    History of kidney stones    Hypertension    Patient denies   Neuropathy due to chemotherapeutic drug [facility])    Neurologist says doesnt have it   Pre-diabetes    Past Surgical History:  Past Surgical History:  Procedure Laterality Date   CATARACT EXTRACTION, BILATERAL     COLON SURGERY  2004   R hemicolectomy - removed due to cancer   COLONOSCOPY     HEMICOLECTOMY     HERNIA REPAIR  2005   LOOP RECORDER INSERTION N/A 05/19/2023   Procedure: LOOP RECORDER INSERTION;  Surgeon: Graciella Freer, PA-C;  Location: [facility] INVASIVE CV LAB;  Service: Cardiovascular;  Laterality: N/A;   REVERSE SHOULDER ARTHROPLASTY Left 11/29/2020   Procedure: REVERSE SHOULDER ARTHROPLASTY;  Surgeon: Bjorn Pippin, MD;  Location: [facility] ORS;  Service: Orthopedics;  Laterality: Left;   Social History:  reports that he has never smoked. He has never used smokeless tobacco. He reports current alcohol use of about 1.0 standard drink of alcohol per week. He reports that he does not use drugs.  Family / Support Systems Marital Status: Married How Long?: 30 years Patient Roles: Spouse, Parent Spouse/Significant Other: Jared [name] (wife) Children: one adult son- Jared [name] (lives in [place]) Other Supports: none reported Anticipated Caregiver: Wife Ability/Limitations of Caregiver: Wife to be primary caregiver per EMR. 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 - How often do you need to have  someone help you when you read instructions, pamphlets, or other written material from your doctor or pharmacy?: Never Writes: Yes Employment Status: Retired Date Retired/Disabled/Unemployed: Retired Secretary/administrator Issues: Denies Guardian/Conservator: Product manager- pt reports likely has completed   Abuse/Neglect Abuse/Neglect Assessment Can Be Completed: Yes Physical Abuse: Denies Verbal Abuse: Denies Sexual Abuse: Denies Exploitation of patient/patient's resources: Denies Self-Neglect: Denies  Patient response to: Social Isolation - How often do you feel lonely or isolated from those around you?: Never  Emotional Status Pt's affect, behavior and adjustment status: Pt is in good spirits. pt has poor attention, and requires more time to answer SW questions. He often will not answer SW question or reports unable to remember. Recent Psychosocial Issues: Denies Psychiatric History: Denies Substance Abuse History: Denies  Patient / Family Perceptions, Expectations & Goals Pt/Family understanding of illness & functional limitations: Pt has general understanding of care needs Premorbid pt/family roles/activities: Independent Anticipated changes in roles/activities/participation: Assistance with ADLs/IADLs Pt/family expectations/goals: Pt goal is to work on "cognizant capabilities."  [employer]: None Premorbid Home Care/DME Agencies: None Transportation available at discharge: TBD Is the patient able to respond to transportation needs?: Yes In the past 12 months, has lack of transportation kept you from medical appointments or from getting medications?: No In the past 12 months, has lack of transportation kept you from meetings, work, or from getting things needed for daily living?: No Resource referrals recommended: Neuropsychology  Discharge Planning Living Arrangements: Spouse/significant other Support Systems: Spouse/significant  other, Children Type of Residence: Private residence Insurance Resources: Media planner (  specify) ([employer] Medicare and Tricare (secondary)) Financial Resources: Restaurant manager, fast food Screen Referred: Yes Money Management: Spouse, Patient Does the patient have any problems obtaining your medications?: No Home Management: Pt wife manages all home care needs Patient/Family Preliminary Plans: No changes Care Coordinator Barriers to Discharge: Decreased caregiver support, Lack of/limited family support, Insurance for SNF coverage Care Coordinator Anticipated Follow Up Needs: HH/OP Expected length of stay: 7-14 days  Clinical Impression SW met with pt at bedside while pt eating lunch. SW introduced self, explained role, informed ELOS, and discuss discharge process. DME: RW, 3in1 BSC and w/c. Pt is unable to remember if he has any other DME.   Jared [name] 05/23/2023, 1:54 PM

## 2023-05-23 NOTE — Progress Notes (Signed)
Speech Language Pathology Daily Session Note  Patient Details  Name: Jared [name]. MRN: [id number] Date of Birth: 1940-09-05  Today's Date: 05/23/2023 SLP Individual Time: 4010-2725 SLP Individual Time Calculation (min): 55 min  Short Term Goals: Week 1: SLP Short Term Goal 1 (Week 1): Patient will demonstrate orientation to time, place, location and self with mod multimodal A SLP Short Term Goal 2 (Week 1): Patient will demonstrate basic problem solving abilities in functional daily situations given mod multimodal A SLP Short Term Goal 3 (Week 1): Patient will recall biographical information with mod multimodal A SLP Short Term Goal 4 (Week 1): Patient will demonstrate awareness of cognitive and physical changes with max multimodal A  Skilled Therapeutic Interventions:   Pt seen for skilled SLP session to address functional cognitive goals. Pt demonstrated delayed processing speed and response time throughout session today. He verbalized awareness and concern regarding delayed processing time. Pt completed visual safety awareness task x2 with min-mod cues needed to identify all hazards and explain solutions needed.  Pt completed visual memory task x2 with 50-70% accuracy independently; improved accuracy to 80-90% given min descriptive cues of the scene. Pt left sitting upright in bed with bed alarm activated and call bell in reach. Continue SLP PoC.   Pain Pain Assessment Pain Scale: 0-10 Pain Score: 0-No pain  Therapy/Group: Individual Therapy  Ellery Plunk 05/23/2023, 11:34 AM

## 2023-05-23 NOTE — Progress Notes (Addendum)
PROGRESS NOTE   Subjective/Complaints: No new complaints. Feels fine  ROS: Patient denies fever, rash, sore throat, blurred vision, dizziness, nausea, vomiting, diarrhea, cough, shortness of breath or chest pain, joint or back/neck pain, headache, or mood change.    Objective:   DG Chest 2 View Result Date: 05/22/2023 CLINICAL DATA:  Fever. EXAM: CHEST - 2 VIEW COMPARISON:  05/15/2023 FINDINGS: Poor inspiration. Normal sized heart. Tortuous aorta. Clear lungs with crowding of the pulmonary vasculature and lung markings. Loop recorder in place. Left shoulder prosthesis. Thoracic spine degenerative changes. IMPRESSION: Poor inspiration. No acute abnormality. Electronically Signed   By: Beckie Salts M.D.   On: 05/22/2023 15:08   Recent Labs    05/22/23 0618 05/23/23 0647  WBC 19.3* 10.5  HGB 13.8 13.8  HCT 40.8 40.6  PLT 257 238   Recent Labs    05/22/23 0618  NA 135  K 4.2  CL 102  CO2 24  GLUCOSE 148*  BUN 15  CREATININE 1.17  CALCIUM 8.8*    Intake/Output Summary (Last 24 hours) at 05/23/2023 1031 Last data filed at 05/22/2023 1808 Gross per 24 hour  Intake 358 ml  Output --  Net 358 ml        Physical Exam: Vital Signs Blood pressure 139/83, pulse 73, temperature 98.5 F (36.9 C), resp. rate 17, height 5\' 10"  (1.778 m), weight 75.9 kg, SpO2 96%.  Constitutional: No distress . Vital signs reviewed. HEENT: NCAT, EOMI, oral membranes moist Neck: supple Cardiovascular: RRR without murmur. No JVD    Respiratory/Chest: CTA Bilaterally without wheezes or rales. Normal effort    GI/Abdomen: BS +, non-tender, non-distended Ext: no clubbing, cyanosis, or edema Psych: pleasant and cooperative  Skin: Clean and intact without signs of breakdown Neuro:  Alert and oriented x 3. Fair insight and awareness and memory. Normal language. Speech still is a little delayed. Cranial nerve exam unremarkable. MMT: 5/5 in BUE  and 4/5>4+/5 in LE. Sensory exam normal for light touch and pain in all 4 limbs. No limb ataxia or cerebellar signs. No abnormal tone appreciated.  .   Musculoskeletal: Full ROM, No pain with AROM or PROM in the neck, trunk, or extremities. Posture appropriate     Assessment/Plan: 1. Functional deficits which require 3+ hours per day of interdisciplinary therapy in a comprehensive inpatient rehab setting. Physiatrist is providing close team supervision and 24 hour management of active medical problems listed below. Physiatrist and rehab team continue to assess barriers to discharge/monitor patient progress toward functional and medical goals  Care Tool:  Bathing    Body parts bathed by patient: Chest, Abdomen, Front perineal area, Buttocks, Right upper leg, Left upper leg, Face   Body parts bathed by helper: Right arm, Left arm, Left lower leg, Right lower leg     Bathing assist Assist Level: Moderate Assistance - Patient 50 - 74%     Upper Body Dressing/Undressing Upper body dressing   What is the patient wearing?: Pull over shirt    Upper body assist Assist Level: Minimal Assistance - Patient > 75%    Lower Body Dressing/Undressing Lower body dressing      What is the patient wearing?:  Underwear/pull up, Pants     Lower body assist Assist for lower body dressing: Minimal Assistance - Patient > 75%     Toileting Toileting    Toileting assist Assist for toileting: Moderate Assistance - Patient 50 - 74%     Transfers Chair/bed transfer  Transfers assist  Chair/bed transfer activity did not occur: Safety/medical concerns (unsafe to get up)  Chair/bed transfer assist level: Moderate Assistance - Patient 50 - 74%     Locomotion Ambulation   Ambulation assist      Assist level: Moderate Assistance - Patient 50 - 74% Assistive device: Walker-rolling Max distance: 29ft   Walk 10 feet activity   Assist     Assist level: Moderate Assistance - Patient - 50  - 74% Assistive device: Walker-rolling   Walk 50 feet activity   Assist Walk 50 feet with 2 turns activity did not occur: Safety/medical concerns         Walk 150 feet activity   Assist Walk 150 feet activity did not occur: Safety/medical concerns         Walk 10 feet on uneven surface  activity   Assist Walk 10 feet on uneven surfaces activity did not occur: Safety/medical concerns         Wheelchair     Assist Is the patient using a wheelchair?: Yes (for transport at this time) Type of Wheelchair: Manual (used scooter at baseline)    Wheelchair assist level: Dependent - Patient 0%      Wheelchair 50 feet with 2 turns activity    Assist        Assist Level: Dependent - Patient 0%   Wheelchair 150 feet activity     Assist      Assist Level: Dependent - Patient 0%   Blood pressure 139/83, pulse 73, temperature 98.5 F (36.9 C), resp. rate 17, height 5\' 10"  (1.778 m), weight 75.9 kg, SpO2 96%.  Medical Problem List and Plan: 1. 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  Status post loop recorder placement 05/19/2023             -patient may  shower             -ELOS/Goals: 7-10 Sup PT/OT/SLP            -Continue CIR therapies including PT, OT, and SLP  2.  Antithrombotics: -DVT/anticoagulation:  Pharmaceutical: Lovenox.  Venous Doppler studies negative             -antiplatelet therapy: Aspirin 81 mg daily and Plavix 75 mg day x 3 weeks then aspirin alone 3. Pain Management: Tylenol as needed 4. Mood/Behavior/Sleep: Provide emotional support             -antipsychotic agents: N/A 5. Neuropsych/cognition: This patient may be capable of making decisions on his own behalf. 6. Skin/Wound Care: Routine skin checks 7. Fluids/Electrolytes/Nutrition: encourage PO  -continue protein supp for low albumin 8.  Hyperlipidemia.  Crestor 9.  Previous NPH  workup/cognitive impairment.  Followed outpatient by neurology service Dr. Vickey Huger.  B12, TSH, ESR, RPR normal. -Continue f/u with Dr. Vickey Huger and [facility] neurology as outpatient -Uses Wheelchair/Scooter at home 10.  Left CCA stenosis.  CTA head and neck 60% stenosis distal left CCA secondary to plaque with ulceration. -Outpatient f/u with vascular surgery 11.  Hypertension.  Long term goal normotensive. Monitor with increased mobility.  Patient on no antihypertensive medications prior to admission.  -bp  borderline today--monitor for patern 12.  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  Follow-up outpatient 13. Leukocytosis: miraculously down to 10k although he still has had some low grade temps (99)  -ua/ucx still hasn't been collected  -cxr yesterday with poor inspiration.  -might be a little atelectasis  -IS has been ordered, continue OOB   -f/u labs Monday, monitor clinically for now    LOS: 2 days A FACE TO FACE EVALUATION WAS PERFORMED  Ranelle Oyster 05/23/2023, 10:31 AM

## 2023-05-23 NOTE — Progress Notes (Signed)
Patient ID: Jared Merlin., male   DOB: 08-16-1940, 83 y.o.   MRN: [id number]  [id number] left message for pt wife Jared [name] to introduce self, explain role, discuss discharge process, and inform on ELOS. SW waiting on follow-up to confirm discharge plan.   Cecile Sheerer, MSW, LCSW Office: [phone] Cell: [phone] Fax: [phone]

## 2023-05-23 NOTE — Care Management (Signed)
[facility] Individual Statement of Services  Patient Name:  Jared [name].  Date:  05/23/2023  Welcome to the [facility].  Our goal is to provide you with an individualized program based on your diagnosis and situation, designed to meet your specific needs.  With this comprehensive rehabilitation program, you will be expected to participate in at least 3 hours of rehabilitation therapies Monday-Friday, with modified therapy programming on the weekends.  Your rehabilitation program will include the following services:  Physical Therapy (PT), Occupational Therapy (OT), Speech Therapy (ST), 24 hour per day rehabilitation nursing, Therapeutic Recreaction (TR), Psychology, Neuropsychology, Care Coordinator, Rehabilitation Medicine, Nutrition Services, Pharmacy Services, and Other  Weekly team conferences will be held on Tuesdays to discuss your progress.  Your Inpatient Rehabilitation Care Coordinator will talk with you frequently to get your input and to update you on team discussions.  Team conferences with you and your family in attendance may also be held.  Expected length of stay: 7-14 days  Overall anticipated outcome: Supervision  Depending on your progress and recovery, your program may change. Your Inpatient Rehabilitation Care Coordinator will coordinate services and will keep you informed of any changes. Your Inpatient Rehabilitation Care Coordinator's name and contact numbers are listed  below.  The following services may also be recommended but are not provided by the [facility]:  Driving Evaluations Home Health Rehabiltiation Services Outpatient Rehabilitation Services Vocational Rehabilitation   Arrangements will be made to provide these services after discharge if needed.  Arrangements include referral to agencies that provide these services.  Your insurance has been verified to be:  [employer]  Your primary  doctor is:  Jarome Matin  Pertinent information will be shared with your doctor and your insurance company.  Inpatient Rehabilitation Care Coordinator:  Susie Cassette [phone] or [phone]  Information discussed with and copy given to patient by: Gretchen Short, 05/23/2023, 11:49 AM

## 2023-05-23 NOTE — Progress Notes (Incomplete)
Physical Therapy Session Note  Patient Details  Name: Jared [name]. MRN: [id number] Date of Birth: 1940/06/10  Today's Date: 05/23/2023 PT Individual Time: 0807-0920 PT Individual Time Calculation (min): 73 min   Short Term Goals: Week 1:  PT Short Term Goal 1 (Week 1): Pt will perform supine<>sit with min A PT Short Term Goal 2 (Week 1): Pt will perform sit<>stands using LRAD with CGA PT Short Term Goal 3 (Week 1): Pt will consistently perform bed<>chair transfers using LRAD with min A PT Short Term Goal 4 (Week 1): Pt will ambulate at least 63ft using LRAD with min A PT Short Term Goal 5 (Week 1): Pt will participate in assessment of the need for a custom wheelchair to allow him increased independence with functional mobility in the home  Skilled Therapeutic Interventions/Progress Updates:  Patient supine in bed on entrance to room. Patient alert and agreeable to PT session.   Patient with no pain complaint at start of session.  Noted UI in brief.   Therapeutic Activity: Bed Mobility: Pt performed supine > sit with CGA. VC/ tc required for technique to roll first and then push up with UE. Seated balance good with no LOB during donning of socks/ shoes.  Transfers: Pt performed sit<>stand to RW with light CGA. Stand pivot transfers using RW throughout session with heavy vc for step progression/ sequencing and extra time d/t pt's minimal movements in BLE. Required facilitation of weight shift in order to increase size of step. Toilet transfer performed similarly from w/c with use of safety rail in bathroom. With repeated practice during session, RW use and stepping minimally improves. Intermittently demos initiation of stepping with rise to toes, forward lean, and no other movement.   At toilet, pt intermittently semi-stands with supervision - unclear if this is regular behavior. Pericare performed with supervision and then provided with washcloths to complete with continued supervision.  PT then stands at sink to brush teeth and comb hair at sink. Completes with supervision and requires reposition to sit in w/c halfway through.   When using RW and nearing more stable hand hold, pt tending to reach out with one hand to and leaving one hand on RW with difficulty managing RW.   Neuromuscular Re-ed: NMR facilitated during session with focus on standing balance, proprioception, motor control. Pt guided in high knee stepping to foot touches to color discs placed on floor in order to encourage increased step height and length. With practice pt is able to improve significantly with high knees and easily reaching color discs with blocked practice of each LE x10 then progressed to alternating touches. Requires increased vc to continue with good quality of movement during alternating touches. Progressed to gait training. NMR performed for improvements in motor control and coordination, balance, sequencing, judgement, and self confidence/ efficacy in performing all aspects of mobility at highest level of independence.  Gait Training:  Pt ambulated 40' x1 using RW in hallway from room and requiring vc and CGA/ MinA for increasing step length/ height bilaterally with moderate improvement in step length. Lateral weight shift facilitation required throughout.   Following NMR, 13' x1 using RW with CGA and significant improvement in step length/ height as well as step/ heel-to-toe progression. Provided vc to maintain.  Pt clocked to ambulate 10 ft within 1 min 4 sec with improved gait pattern - reciprocal partial step-thru throughout bout. (0.048 m/s - indicative of a household ambulator)  Patient supine in bed at end of session with brakes  locked, bed alarm set, and all needs within reach.   Therapy Documentation Precautions:  Precautions Precautions: Other (comment), Fall Precaution Comments: lack of wt shift w/ shuffled gait and anterior lean; very delayed processing for all  tasks Restrictions Weight Bearing Restrictions Per Provider Order: No  Pain:  No pain indicated throughout session.   Therapy/Group: Individual Therapy  Loel Dubonnet PT, DPT, CSRS 05/23/2023, 10:23 AM

## 2023-05-24 DIAGNOSIS — I639 Cerebral infarction, unspecified: Secondary | ICD-10-CM | POA: Diagnosis not present

## 2023-05-24 MED ORDER — TOPIRAMATE 25 MG PO TABS
25.0000 mg | ORAL_TABLET | Freq: Every day | ORAL | Status: DC | PRN
  Administered 2023-05-24: 25 mg via ORAL
  Filled 2023-05-24 (×2): qty 1

## 2023-05-24 NOTE — Progress Notes (Signed)
Occupational Therapy Session Note  Patient Details  Name: Jared [name]. MRN: [id number] Date of Birth: 1940/06/14  Today's Date: 05/24/2023 OT Individual Time: 1309-1405 OT Individual Time Calculation (min): 56 min    Short Term Goals: Week 1:  OT Short Term Goal 1 (Week 1): STG=LTG due to LOS  Skilled Therapeutic Interventions/Progress Updates: Pt greeted supine in bed, pt agreeable to OT intervention.      Transfers/bed mobility/functional mobility: pt completed supine>sit with light MIN A to scoot hips to EOB, pt requires MIN step by step cues to sequence task and + time to transition. Pt completed stand pivot from EOB>w/c with Rw and CGA with MOD verbal/tactile cues to sequence pivotal steps to w/c.   Pt completed functional ambulation greater than a household distance with RW and CGA- MINA, pt required step by step cues to increase stride length and problem solve Rw mgmt. Pt did improve with visual cue of theraband wrapped around walker to remind pt to take bigger steps.   Therapeutic activity: pt completed various therapeutic activities focused on dynamic balance, BUE/BLE coordination, standing tolerance and improving overall strength/endurance:  - pt instructed to complete targeted steps to cones with an emphasis on improving GM coordination in BLEs with BUE support from railing. Pt completed task with CGA with MOD verbal cues for technique and upright posture.  - pt completed Alternating stepping task to 4 inch step with pt instructed to step up on step with BUE support from RW, reach dynamically to retrieve plastic discs and then reach across midline to match disc to cone. Pt completed task with MINA- CGA, MOD verbal cues needed for sequencing and body mechanics with pt needing + time for transitions.     ADLs:  Footwear: donned shoes with set- up assist.   Ended session with pt supine in bed with all needs within reach and bed alarm activated.                    Therapy  Documentation Precautions:  Precautions Precautions: Other (comment), Fall Precaution Comments: lack of wt shift w/ shuffled gait and anterior lean; very delayed processing for all tasks Restrictions Weight Bearing Restrictions Per Provider Order: No  Pain: Unrated pain reported from HA, pain meds provided and rest breaks provided as needed.    Therapy/Group: Individual Therapy  [name] [facility] 05/24/2023, 3:07 PM

## 2023-05-24 NOTE — Progress Notes (Signed)
Speech Language Pathology Daily Session Note  Patient Details  Name: Jared [name]. MRN: [id number] Date of Birth: 31-Dec-1940  Today's Date: 05/24/2023 SLP Individual Time: 1100-1140 SLP Individual Time Calculation (min): 40 min  Short Term Goals: Week 1: SLP Short Term Goal 1 (Week 1): Patient will demonstrate orientation to time, place, location and self with mod multimodal A SLP Short Term Goal 2 (Week 1): Patient will demonstrate basic problem solving abilities in functional daily situations given mod multimodal A SLP Short Term Goal 3 (Week 1): Patient will recall biographical information with mod multimodal A SLP Short Term Goal 4 (Week 1): Patient will demonstrate awareness of cognitive and physical changes with max multimodal A  Skilled Therapeutic Interventions: Skilled therapy session focused on cognitive goals. Upon entrance, Patient able to independently recall time (month, year), however as the session progressed required modA for orientation. SLP provided calendar in room for external aid. Due to noted deficits in memory, SLP initiated use of memory book. Patient unable to write, therefore, SLP completed written portion. Patient independently recalled inability to participate in PT this AM due to headache. SLP continued to address cognitive skills through verbal problem solving task. Patient answered problem solving questions and provided functional solutions with min-modA. At end of session, patient reporting headache and requesting nurse. 20 minutes of session missed due to pain. SLP will attempt to return as patient/therapist schedule allows. Patient left in bed with alarm set and call bell in reach. Continue POC.  Pain Headache, nursing aware  Therapy/Group: Individual Therapy  Kayce Chismar M.A., CF-SLP 05/24/2023, 7:39 AM

## 2023-05-24 NOTE — Progress Notes (Signed)
PROGRESS NOTE   Subjective/Complaints: C/o headache, prn daily topamax ordered  ROS: Patient denies fever, rash, sore throat, blurred vision, dizziness, nausea, vomiting, diarrhea, cough, shortness of breath or chest pain, joint or back/neck pain, headache, or mood change. +headache   Objective:   No results found.  Recent Labs    05/22/23 0618 05/23/23 0647  WBC 19.3* 10.5  HGB 13.8 13.8  HCT 40.8 40.6  PLT 257 238   Recent Labs    05/22/23 0618  NA 135  K 4.2  CL 102  CO2 24  GLUCOSE 148*  BUN 15  CREATININE 1.17  CALCIUM 8.8*    Intake/Output Summary (Last 24 hours) at 05/24/2023 1600 Last data filed at 05/24/2023 0957 Gross per 24 hour  Intake 816 ml  Output --  Net 816 ml        Physical Exam: Vital Signs Blood pressure 120/80, pulse 90, temperature 98.7 F (37.1 C), resp. rate 17, height 5\' 10"  (1.778 m), weight 75.9 kg, SpO2 95%.  Constitutional: No distress . Vital signs reviewed. HEENT: NCAT, EOMI, oral membranes moist Neck: supple Cardiovascular: RRR without murmur. No JVD    Respiratory/Chest: CTA Bilaterally without wheezes or rales. Normal effort    GI/Abdomen: BS +, non-tender, non-distended Ext: no clubbing, cyanosis, or edema Psych: pleasant and cooperative  Skin: Clean and intact without signs of breakdown Neuro:  Alert and oriented x 3. Fair insight and awareness and memory. Normal language. Speech still is a little delayed. Cranial nerve exam unremarkable. MMT: 5/5 in BUE and 4/5>4+/5 in LE. Sensory exam normal for light touch and pain in all 4 limbs. No limb ataxia or cerebellar signs. No abnormal tone appreciated.  Stable 1/18 Musculoskeletal: Full ROM, No pain with AROM or PROM in the neck, trunk, or extremities. Posture appropriate     Assessment/Plan: 1. Functional deficits which require 3+ hours per day of interdisciplinary therapy in a comprehensive inpatient rehab  setting. Physiatrist is providing close team supervision and 24 hour management of active medical problems listed below. Physiatrist and rehab team continue to assess barriers to discharge/monitor patient progress toward functional and medical goals  Care Tool:  Bathing    Body parts bathed by patient: Chest, Abdomen, Front perineal area, Buttocks, Right upper leg, Left upper leg, Face   Body parts bathed by helper: Right arm, Left arm, Left lower leg, Right lower leg     Bathing assist Assist Level: Moderate Assistance - Patient 50 - 74%     Upper Body Dressing/Undressing Upper body dressing   What is the patient wearing?: Pull over shirt    Upper body assist Assist Level: Minimal Assistance - Patient > 75%    Lower Body Dressing/Undressing Lower body dressing      What is the patient wearing?: Underwear/pull up, Pants     Lower body assist Assist for lower body dressing: Minimal Assistance - Patient > 75%     Toileting Toileting    Toileting assist Assist for toileting: Moderate Assistance - Patient 50 - 74%     Transfers Chair/bed transfer  Transfers assist  Chair/bed transfer activity did not occur: Safety/medical concerns (unsafe to get up)  Chair/bed  transfer assist level: Moderate Assistance - Patient 50 - 74%     Locomotion Ambulation   Ambulation assist      Assist level: Moderate Assistance - Patient 50 - 74% Assistive device: Walker-rolling Max distance: 70ft   Walk 10 feet activity   Assist     Assist level: Moderate Assistance - Patient - 50 - 74% Assistive device: Walker-rolling   Walk 50 feet activity   Assist Walk 50 feet with 2 turns activity did not occur: Safety/medical concerns         Walk 150 feet activity   Assist Walk 150 feet activity did not occur: Safety/medical concerns         Walk 10 feet on uneven surface  activity   Assist Walk 10 feet on uneven surfaces activity did not occur: Safety/medical  concerns         Wheelchair     Assist Is the patient using a wheelchair?: Yes (for transport at this time) Type of Wheelchair: Manual (used scooter at baseline)    Wheelchair assist level: Dependent - Patient 0%      Wheelchair 50 feet with 2 turns activity    Assist        Assist Level: Dependent - Patient 0%   Wheelchair 150 feet activity     Assist      Assist Level: Dependent - Patient 0%   Blood pressure 120/80, pulse 90, temperature 98.7 F (37.1 C), resp. rate 17, height 5\' 10"  (1.778 m), weight 75.9 kg, SpO2 95%.  Medical Problem List and Plan: 1. 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  Status post loop recorder placement 05/19/2023             -patient may  shower             -ELOS/Goals: 7-10 Sup PT/OT/SLP            -Continue CIR therapies including PT, OT, and SLP  2.  Antithrombotics: -DVT/anticoagulation:  Pharmaceutical: Lovenox.  Venous Doppler studies negative             -antiplatelet therapy: Aspirin 81 mg daily and Plavix 75 mg day x 3 weeks then aspirin alone 3. Pain Management: Tylenol as needed 4. Mood/Behavior/Sleep: Provide emotional support             -antipsychotic agents: N/A 5. Neuropsych/cognition: This patient may be capable of making decisions on his own behalf. 6. Skin/Wound Care: Routine skin checks 7. Fluids/Electrolytes/Nutrition: encourage PO  -continue protein supp for low albumin 8.  Hyperlipidemia.  Continue Crestor  9.  Previous NPH workup/cognitive impairment.  Followed outpatient by neurology service Dr. Vickey Huger.  B12, TSH, ESR, RPR normal. -Continue f/u with Dr. Vickey Huger and [facility] neurology as outpatient -Uses Wheelchair/Scooter at home  10.  Left CCA stenosis.  CTA head and neck 60% stenosis distal left CCA secondary to plaque with ulceration. -Outpatient f/u with vascular surgery  11.  Hypertension.  Long term goal  normotensive. Monitor with increased mobility.  Patient on no antihypertensive medications prior to admission.  BP reviewed and normal  12.  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  Follow-up outpatient. Consider Qutenza  13. Leukocytosis: miraculously down to 10k although he still has had some low grade temps (99)  -ua/ucx still hasn't been collected  -cxr yesterday with poor inspiration.  -might be a little atelectasis  -IS has been ordered, continue OOB   -  f/u labs Monday, monitor clinically for now    LOS: 3 days A FACE TO FACE EVALUATION WAS PERFORMED  Alfred Eckley P Gagan Dillion 05/24/2023, 4:00 PM

## 2023-05-24 NOTE — Progress Notes (Signed)
Physical Therapy Session Note  Patient Details  Name: Clem Marken. MRN: [id number] Date of Birth: May 09, 1940  Today's Date: 05/24/2023 PT Individual Time: 1324-4010 PT Individual Time Calculation (min): 30 min   Short Term Goals: Week 1:  PT Short Term Goal 1 (Week 1): Pt will perform supine<>sit with min A PT Short Term Goal 2 (Week 1): Pt will perform sit<>stands using LRAD with CGA PT Short Term Goal 3 (Week 1): Pt will consistently perform bed<>chair transfers using LRAD with min A PT Short Term Goal 4 (Week 1): Pt will ambulate at least 50ft using LRAD with min A PT Short Term Goal 5 (Week 1): Pt will participate in assessment of the need for a custom wheelchair to allow him increased independence with functional mobility in the home   Skilled Therapeutic Interventions/Progress Updates:  Patient returning from toileting with RN and NT on entrance to room. Assist in stand pivot with minA +2 and vc for increased step height/ length. Returns to supine with supervision/ CGA for BLE and UB positioning. Patient alert and complaining of headache pain at start of session. Holds R side of head throughout session with palm of R hand.   Pt has already been premedicated with Tylenol.   Pt provided with soapy washcloth to wash hands, then wet washcloth to rinse. Is able to adequately perform hand hygiene following toileting. Cool washcloth applied to pt's forehead. Asked pt if he would like to try cold pack to area of headache and pt agreeable. Cold pack retrieved and HOB lowered to position pt for improved hold of cold pack in place.   Patient supine at end of session with brakes locked, bed alarm set, and all needs within reach. NTs arriving to bathe pt.    Therapy Documentation Precautions:  Precautions Precautions: Other (comment), Fall Precaution Comments: lack of wt shift w/ shuffled gait and anterior lean; very delayed processing for all tasks Restrictions Weight Bearing  Restrictions Per Provider Order: No General: PT Amount of Missed Time (min): 15 Minutes PT Missed Treatment Reason: Patient ill (Comment);Pain (headache pain) Vital Signs:   Pain: Headache pain this session. Already pre-medicated with Tylenol. Provided with cryotherapy.   Therapy/Group: Individual Therapy  Loel Dubonnet PT, DPT, CSRS 05/24/2023, 11:02 AM

## 2023-05-24 NOTE — IPOC Note (Signed)
Overall Plan of Care (IPOC) Patient Details Name: Jared [name]. MRN: [id number] DOB: 1940-09-09  Admitting Diagnosis: Cardioembolic stroke [facility])  Hospital Problems: Principal Problem:   Cardioembolic stroke [facility])     Functional Problem List: Nursing Bladder, Bowel, Endurance, Medication Management, Safety  PT Balance, Safety, Sensory, Edema, Endurance, Motor, Nutrition, Pain, Perception  OT Balance, Motor, Sensory, Behavior, Cognition, Perception, Endurance, Safety  SLP Cognition  TR         Basic ADL's: OT Bathing, Dressing, Toileting     Advanced  ADL's: OT       Transfers: PT Bed Mobility, Bed to Chair, Car, Occupational psychologist, Research scientist (life sciences): PT Ambulation, Psychologist, prison and probation services, Stairs     Additional Impairments: OT None  SLP Social Cognition   Problem Solving, Memory, Awareness  TR      Anticipated Outcomes Item Anticipated Outcome  Self Feeding Independent  Swallowing      Basic self-care  Intermittent min assist  Toileting  supervision   Bathroom Transfers supervision  Bowel/Bladder  manage bladder with toileting and bowel with medications  Transfers  supervision using LRAD  Locomotion  CGA using LRAD  Communication     Cognition  minA  Pain  <than 4 with prn medications  Safety/Judgment  manage safety with supervision and cues   Therapy Plan: PT Intensity: Minimum of 1-2 x/day ,45 to 90 minutes PT Frequency: 5 out of 7 days PT Duration Estimated Length of Stay: ~ 2 weeks OT Intensity: Minimum of 1-2 x/day, 45 to 90 minutes OT Frequency: 5 out of 7 days OT Duration/Estimated Length of Stay: 7-9 Days SLP Intensity: Minumum of 1-2 x/day, 30 to 90 minutes SLP Frequency: 3 to 5 out of 7 days SLP Duration/Estimated Length of Stay: 7-10   Team Interventions: Nursing Interventions Bladder Management, Bowel Management, Disease Management/Prevention, Discharge Planning, Cognitive Remediation/Compensation, Medication  Management, Patient/Family Education  PT interventions Ambulation/gait training, Community reintegration, Neuromuscular re-education, DME/adaptive equipment instruction, Psychosocial support, Stair training, UE/LE Strength taining/ROM, Wheelchair propulsion/positioning, Warden/ranger, Discharge planning, Pain management, Skin care/wound management, Therapeutic Activities, UE/LE Coordination activities, Cognitive remediation/compensation, Disease management/prevention, Functional mobility training, Patient/family education, Therapeutic Exercise, Visual/perceptual remediation/compensation  OT Interventions Balance/vestibular training, Disease mangement/prevention, Neuromuscular re-education, Patient/family education, Self Care/advanced ADL retraining, Therapeutic Exercise, UE/LE Coordination activities, Cognitive remediation/compensation, Discharge planning, DME/adaptive equipment instruction, Functional mobility training, Therapeutic Activities, UE/LE Strength taining/ROM, Visual/perceptual remediation/compensation  SLP Interventions Cognitive remediation/compensation, Cueing hierarchy, Environmental controls, Functional tasks, Internal/external aids, Patient/family education, Therapeutic Activities, Therapeutic Exercise  TR Interventions    SW/CM Interventions Discharge Planning, Psychosocial Support, Patient/Family Education   Barriers to Discharge MD  Medical stability  Nursing Home environment access/layout, Incontinence Multi level home 4 steps to entry with wife.  Bed and bath on main level.  Uses an electric wheelchair for mobility in the home, furniture service and bathroom.  Rollator to get into the car.  Has a home health aide 2 times a week  PT Decreased caregiver support, Home environment access/layout, Inaccessible home environment, Lack of/limited family support, Incontinence    OT      SLP      SW Decreased caregiver support, Lack of/limited family support, Community education officer for  SNF coverage     Team Discharge Planning: Destination: PT-Home ,OT- Home , SLP-Home Projected Follow-up: PT-Home health PT, 24 hour supervision/assistance, OT-  Home health OT, SLP-Home Health SLP, Outpatient SLP, 24 hour supervision/assistance Projected Equipment Needs: PT-To be determined, Other (comment), OT- To be determined, SLP-None recommended  by SLP Equipment Details: PT-may need custom wheelchair referral, OT-  Patient/family involved in discharge planning: PT- Patient,  OT-Patient, SLP-Patient  MD ELOS: 7-10 days S Medical Rehab Prognosis:  Excellent Assessment: The patient has been admitted for CIR therapies with the diagnosis of right occipital and frontal infarcts. The team will be addressing functional mobility, strength, stamina, balance, safety, adaptive techniques and equipment, self-care, bowel and bladder mgt, patient and caregiver education. Goals have been set at supervision. Anticipated discharge destination is home.        See Team Conference Notes for weekly updates to the plan of care

## 2023-05-24 NOTE — Progress Notes (Signed)
Physical Therapy Session Note  Patient Details  Name: Jared [name]. MRN: [id number] Date of Birth: 30-May-1940  Today's Date: 05/24/2023 Today's Date: 05/24/2023 PT Missed Time: 45 Minutes Missed Time Reason: Pain  Short Term Goals: Week 1:  PT Short Term Goal 1 (Week 1): Pt will perform supine<>sit with min A PT Short Term Goal 2 (Week 1): Pt will perform sit<>stands using LRAD with CGA PT Short Term Goal 3 (Week 1): Pt will consistently perform bed<>chair transfers using LRAD with min A PT Short Term Goal 4 (Week 1): Pt will ambulate at least 78ft using LRAD with min A PT Short Term Goal 5 (Week 1): Pt will participate in assessment of the need for a custom wheelchair to allow him increased independence with functional mobility in the home  Pt missed 45 min of skilled therapy due to increased pain from HA. Pt with hand on head and confusion with reports of HA and did not recall talking to nsg about medication to assist. PTA located nsg and nsg stated to give meds after finishing with current pt. Will re-attempt as schedule and pt availability permits.  PTA returned at later time with pt still having HA and did not recall nsg giving meds (nsg stated that was so). Pt still denied PT services due to HA. Will re-attempt as schedule and pt availability permits.      Therapy Documentation Precautions:  Precautions Precautions: Other (comment), Fall Precaution Comments: lack of wt shift w/ shuffled gait and anterior lean; very delayed processing for all tasks Restrictions Weight Bearing Restrictions Per Provider Order: No   Therapy/Group: Individual Therapy  Christyl Osentoski PTA 05/24/2023, 7:48 AM

## 2023-05-25 DIAGNOSIS — I639 Cerebral infarction, unspecified: Secondary | ICD-10-CM | POA: Diagnosis not present

## 2023-05-25 MED ORDER — BUTALBITAL-APAP-CAFFEINE 50-325-40 MG PO TABS: 1.0000 | ORAL_TABLET | Freq: Four times a day (QID) | ORAL | Status: DC | PRN

## 2023-05-25 MED ORDER — TOPIRAMATE 25 MG PO TABS
25.0000 mg | ORAL_TABLET | Freq: Every day | ORAL | Status: DC
  Administered 2023-05-25 – 2023-05-29 (×5): 25 mg via ORAL
  Filled 2023-05-25 (×5): qty 1

## 2023-05-25 MED ORDER — CEPHALEXIN 250 MG PO CAPS
500.0000 mg | ORAL_CAPSULE | Freq: Two times a day (BID) | ORAL | Status: AC
  Administered 2023-05-25 – 2023-05-29 (×10): 500 mg via ORAL
  Filled 2023-05-25 (×10): qty 2

## 2023-05-25 NOTE — Progress Notes (Signed)
PROGRESS NOTE   Subjective/Complaints: Headache improved with topamax but he feels it starting to come on again, discussed adding prn Fioricet during the day and scheduling th topamax at night  ROS: Patient denies fever, rash, sore throat, blurred vision, dizziness, nausea, vomiting, diarrhea, cough, shortness of breath or chest pain, joint or back/neck pain, headache, or mood change. +headache- improved with topamax   Objective:   No results found.  Recent Labs    05/23/23 0647  WBC 10.5  HGB 13.8  HCT 40.6  PLT 238   No results for input(s): "NA", "K", "CL", "CO2", "GLUCOSE", "BUN", "CREATININE", "CALCIUM" in the last 72 hours.   Intake/Output Summary (Last 24 hours) at 05/25/2023 1210 Last data filed at 05/25/2023 0800 Gross per 24 hour  Intake 480 ml  Output --  Net 480 ml        Physical Exam: Vital Signs Blood pressure 138/84, pulse 71, temperature 98.4 F (36.9 C), resp. rate 18, height 5\' 10"  (1.778 m), weight 75.9 kg, SpO2 93%.  Constitutional: No distress . Vital signs reviewed. HEENT: NCAT, EOMI, oral membranes moist Neck: supple Cardiovascular: RRR without murmur. No JVD    Respiratory/Chest: CTA Bilaterally without wheezes or rales. Normal effort    GI/Abdomen: BS +, non-tender, non-distended Ext: no clubbing, cyanosis, or edema Psych: pleasant and cooperative  Skin: Clean and intact without signs of breakdown Neuro:  Alert and oriented x 3. Fair insight and awareness and memory. Normal language. Speech still is a little delayed. Cranial nerve exam unremarkable. MMT: 5/5 in BUE and 4/5>4+/5 in LE. Sensory exam normal for light touch and pain in all 4 limbs. No limb ataxia or cerebellar signs. No abnormal tone appreciated.  Stable 1/18 Musculoskeletal: Full ROM, No pain with AROM or PROM in the neck, trunk, or extremities. Posture appropriate , stable 1/19   Assessment/Plan: 1. Functional deficits  which require 3+ hours per day of interdisciplinary therapy in a comprehensive inpatient rehab setting. Physiatrist is providing close team supervision and 24 hour management of active medical problems listed below. Physiatrist and rehab team continue to assess barriers to discharge/monitor patient progress toward functional and medical goals  Care Tool:  Bathing    Body parts bathed by patient: Chest, Abdomen, Front perineal area, Buttocks, Right upper leg, Left upper leg, Face   Body parts bathed by helper: Right arm, Left arm, Left lower leg, Right lower leg     Bathing assist Assist Level: Moderate Assistance - Patient 50 - 74%     Upper Body Dressing/Undressing Upper body dressing   What is the patient wearing?: Pull over shirt    Upper body assist Assist Level: Minimal Assistance - Patient > 75%    Lower Body Dressing/Undressing Lower body dressing      What is the patient wearing?: Underwear/pull up, Pants     Lower body assist Assist for lower body dressing: Minimal Assistance - Patient > 75%     Toileting Toileting    Toileting assist Assist for toileting: Moderate Assistance - Patient 50 - 74%     Transfers Chair/bed transfer  Transfers assist  Chair/bed transfer activity did not occur: Safety/medical concerns (unsafe to get  up)  Chair/bed transfer assist level: Moderate Assistance - Patient 50 - 74%     Locomotion Ambulation   Ambulation assist      Assist level: Moderate Assistance - Patient 50 - 74% Assistive device: Walker-rolling Max distance: 26ft   Walk 10 feet activity   Assist     Assist level: Moderate Assistance - Patient - 50 - 74% Assistive device: Walker-rolling   Walk 50 feet activity   Assist Walk 50 feet with 2 turns activity did not occur: Safety/medical concerns         Walk 150 feet activity   Assist Walk 150 feet activity did not occur: Safety/medical concerns         Walk 10 feet on uneven surface   activity   Assist Walk 10 feet on uneven surfaces activity did not occur: Safety/medical concerns         Wheelchair     Assist Is the patient using a wheelchair?: Yes (for transport at this time) Type of Wheelchair: Manual (used scooter at baseline)    Wheelchair assist level: Dependent - Patient 0%      Wheelchair 50 feet with 2 turns activity    Assist        Assist Level: Dependent - Patient 0%   Wheelchair 150 feet activity     Assist      Assist Level: Dependent - Patient 0%   Blood pressure 138/84, pulse 71, temperature 98.4 F (36.9 C), resp. rate 18, height 5\' 10"  (1.778 m), weight 75.9 kg, SpO2 93%.  Medical Problem List and Plan: 1. 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  Status post loop recorder placement 05/19/2023             -patient may  shower             -ELOS/Goals: 7-10 Sup PT/OT/SLP            -Continue CIR therapies including PT, OT, and SLP  2.  Antithrombotics: -DVT/anticoagulation:  Pharmaceutical: Lovenox.  Venous Doppler studies negative             -antiplatelet therapy: Aspirin 81 mg daily and Plavix 75 mg day x 3 weeks then aspirin alone 3. Pain Management: Tylenol as needed 4. Mood/Behavior/Sleep: Provide emotional support             -antipsychotic agents: N/A 5. Neuropsych/cognition: This patient may be capable of making decisions on his own behalf. 6. Skin/Wound Care: Routine skin checks 7. Fluids/Electrolytes/Nutrition: encourage PO  -continue protein supp for low albumin 8.  Hyperlipidemia.  Continue Crestor  9.  Previous NPH workup/cognitive impairment.  Followed outpatient by neurology service Dr. Vickey Huger.  B12, TSH, ESR, RPR normal. -Continue f/u with Dr. Vickey Huger and [facility] neurology as outpatient -Uses Wheelchair/Scooter at home  10.  Left CCA stenosis.  CTA head and neck 60% stenosis distal left CCA secondary to plaque  with ulceration. -Outpatient f/u with vascular surgery  11.  Hypertension.  Long term goal normotensive. Monitor with increased mobility.  Patient on no antihypertensive medications prior to admission.  Continue to monitor TID  12.  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  Follow-up outpatient. Consider Qutenza. Topamax started at night  13. Leukocytosis: miraculously down to 10k although he still has had some low grade temps (99)  -UA positive, discussed starting Keflex for UTI, f/u UC  -cxr yesterday with poor inspiration.  -might be a  little atelectasis  -IS has been ordered, continue OOB   -f/u labs Monday, monitor clinically for now  14. Headache: topamax started at night and prn Fioricet ordered for the day    LOS: 4 days A FACE TO FACE EVALUATION WAS PERFORMED  Clint Bolder P Zacchaeus Halm 05/25/2023, 12:10 PM

## 2023-05-26 DIAGNOSIS — N3000 Acute cystitis without hematuria: Secondary | ICD-10-CM | POA: Diagnosis not present

## 2023-05-26 DIAGNOSIS — I639 Cerebral infarction, unspecified: Secondary | ICD-10-CM | POA: Diagnosis not present

## 2023-05-26 LAB — BASIC METABOLIC PANEL
Anion gap: 7 (ref 5–15)
BUN: 19 mg/dL (ref 8–23)
CO2: 25 mmol/L (ref 22–32)
Calcium: 8.9 mg/dL (ref 8.9–10.3)
Chloride: 103 mmol/L (ref 98–111)
Creatinine, Ser: 0.96 mg/dL (ref 0.61–1.24)
GFR, Estimated: >60 mL/min (ref >60)
Glucose, Bld: 156 mg/dL — ABNORMAL HIGH (ref 70–99)
Potassium: 4.3 mmol/L (ref 3.5–5.1)
Sodium: 135 mmol/L (ref 135–145)

## 2023-05-26 LAB — CBC
HCT: 41.3 % (ref 39.0–52.0)
Hemoglobin: 14 g/dL (ref 13.0–17.0)
MCH: 30.2 pg (ref 26.0–34.0)
MCHC: 33.9 g/dL (ref 30.0–36.0)
MCV: 89 fL (ref 80.0–100.0)
Platelets: 316 10*3/uL (ref 150–400)
RBC: 4.64 MIL/uL (ref 4.22–5.81)
RDW: 12.3 % (ref 11.5–15.5)
WBC: 7.2 10*3/uL (ref 4.0–10.5)
nRBC: 0 % (ref 0.0–0.2)

## 2023-05-26 LAB — URINE CULTURE: Culture: >=100000 — AB

## 2023-05-26 NOTE — Progress Notes (Signed)
Speech Language Pathology Daily Session Note  Patient Details  Name: Jared [name]. MRN: [id number] Date of Birth: 08/11/1940  Today's Date: 05/26/2023 SLP Individual Time: 0805-0900 SLP Individual Time Calculation (min): 55 min  Short Term Goals: Week 1: SLP Short Term Goal 1 (Week 1): Patient will demonstrate orientation to time, place, location and self with mod multimodal A SLP Short Term Goal 2 (Week 1): Patient will demonstrate basic problem solving abilities in functional daily situations given mod multimodal A SLP Short Term Goal 3 (Week 1): Patient will recall biographical information with mod multimodal A SLP Short Term Goal 4 (Week 1): Patient will demonstrate awareness of cognitive and physical changes with max multimodal A  Skilled Therapeutic Interventions:  Patient was seen in am to address cognitive re- training. Direct handoff from OT with pt seated upright in WC and agreeable for session. Pt indep oriented to month, year, location, and presidential inauguration. External aid and sup to min A warranted for orientation to day of week, MLK holiday, and rationale for hospitalization. Pt recalled biographical information including age, DOB, address, and family members with 100% acc indep. In other minutes of session, SLP engaged pt in reminiscent therapy. Pt talked about his time in the [employer], meeting his wife, and family members with vague amounts of information and difficulty recalling specifics. Pt unable to recall city or town he was raised in, more than one place he was stationed, and where siblings lived. SLP also challenged pt in basic problem solving through completion of visual problem solving cards. Pt identified problems and solutions with 59% acc indep improving to 76% acc with min A. At conclusion of session, pt was left seated upright in [facility] with call button within reach and chair alarm active. SLP to continue POC.   Pain Pain Assessment Pain Scale: 0-10 Pain  Score: 0-No pain  Therapy/Group: Individual Therapy  Renaee Munda 05/26/2023, 8:54 AM

## 2023-05-26 NOTE — Progress Notes (Addendum)
Occupational Therapy Session Note  Patient Details  Name: Jared [name]. MRN: [id number] Date of Birth: March 27, 1941  Today's Date: 05/26/2023 OT Individual Time: 2130-8657 and 1345-1445 OT Individual Time Calculation (min): 30 min and 60 min    Short Term Goals: Week 1:  OT Short Term Goal 1 (Week 1): STG=LTG due to LOS  Skilled Therapeutic Interventions/Progress Updates:    Visit 1: no c/o pain   Pt received in w/c ready for therapy. Pt agreeable to working on transfers and standing balance. Pt demonstrated w/c to toilet stand pivot transfers from w/c to toilet with close Supervision using grab bars.  Pt was not impulsive and carefully stepped his feet around.   Pt then worked on standing at sink to work on balance.  Pt worked on weight shift, stepping in place,  transferring weight from heels to balls of feet, reaching hands around to backside. Pt did extremely well. He stated he feels the "same" as before the stroke.     Pt resting in wc with belt alarm on and all needs met.  Call light in reach.   Visit 2:  Pain; no c/o pain  Pt received in w/c with wife in the room.  Updated wife on what the team was thinking about for discharge and that we would have a conference on Wednesday.  Discussed his PLOF, what his care aid has been doing 2x a week (supervision assist only),  how he has been managing with the power scooter. The scooter is a hand me down from friends.  The speed controls are not working well and pt has caused a lot of damage to walls and cabinets. Pt would benefit from looking at other mobility options. His wife will take photographs  of the bathrooms to let me see how pt would need to mobilize into the bathroom.    Had pt demonstrate how he ambulates to the toilet with RW, completes toileting, (pt had bowel movement and able to self cleanse) stands at sink washes hands and then returns to seat all with close S to CGA.    Discussed having him to more practice walking at home  with assist from care aid or spouse.    She stated he often struggles with donning socks. Introduced International aid/development worker and pt able to don socks with aide and even donned sock onto aide without assist.  They thought this AE would be helpful for home. Gave them some suggestions on where to purchase.   Jared [name] stated that sometimes the pt does not like to listen to her. Spent time with Jared [name] discussing how his limitations may impact what he is aware of and he needs to allow her to guide him to ensure he stays safe.  Pt hesitantly agreed.  Pt transferred to bed. Pt resting in bed with all needs met. Alarm set and call light in reach.      Therapy Documentation Precautions:  Precautions Precautions: Other (comment), Fall Precaution Comments: lack of wt shift w/ shuffled gait and anterior lean; very delayed processing for all tasks Restrictions Weight Bearing Restrictions Per Provider Order: No    ADL: ADL Eating: Minimal assistance Where Assessed-Eating: Chair Grooming: Minimal assistance Where Assessed-Grooming: Sitting at sink Upper Body Bathing: Minimal assistance Where Assessed-Upper Body Bathing: Shower Lower Body Bathing: Moderate assistance Where Assessed-Lower Body Bathing: Shower Upper Body Dressing: Minimal assistance Where Assessed-Upper Body Dressing: Sitting at sink Lower Body Dressing: Maximal assistance Where Assessed-Lower Body Dressing: Sitting at sink, Standing at sink  Toileting: Moderate assistance Where Assessed-Toileting: Teacher, adult education: Curator Method: Surveyor, minerals: Engineer, technical sales: Unable to assess Film/video editor: Insurance underwriter Method: Warden/ranger: Transfer tub bench ADL Comments: Patient maintains one hand at all times in support in sitting and standing   Therapy/Group: Individual Therapy  Jared [name] 05/26/2023, 12:56 PM

## 2023-05-26 NOTE — Progress Notes (Signed)
PROGRESS NOTE   Subjective/Complaints:  No issues overnite   ROS: Patient denies CP, SOB, N/V/D   Objective:   No results found.  Recent Labs    05/26/23 0521  WBC 7.2  HGB 14.0  HCT 41.3  PLT 316   Recent Labs    05/26/23 0521  NA 135  K 4.3  CL 103  CO2 25  GLUCOSE 156*  BUN 19  CREATININE 0.96  CALCIUM 8.9     Intake/Output Summary (Last 24 hours) at 05/26/2023 1038 Last data filed at 05/26/2023 0747 Gross per 24 hour  Intake 818 ml  Output 425 ml  Net 393 ml        Physical Exam: Vital Signs Blood pressure 133/88, pulse 66, temperature 97.9 F (36.6 C), resp. rate 18, height 5\' 10"  (1.778 m), weight 75.9 kg, SpO2 93%.   General: No acute distress Mood and affect are appropriate Heart: Regular rate and rhythm no rubs murmurs or extra sounds Lungs: Clear to auscultation, breathing unlabored, no rales or wheezes Abdomen: Positive bowel sounds, soft nontender to palpation, nondistended Extremities: No clubbing, cyanosis, or edema Skin: No evidence of breakdown, no evidence of rash Neurologic: Cranial nerves II through XII intact, motor strength is 5/5 in bilateral deltoid, bicep, tricep, grip, hip flexor, knee extensors, ankle dorsiflexor and plantar flexor Sensory exam normal sensation to light touch  bilateral upper and lower extremities Cerebellar exam normal finger to nose to finger as well as heel to shin in bilateral upper and lower extremities Mild dysdiadochokinesis with RAM RUE Musculoskeletal: Full range of motion in all 4 extremities. No joint swelling   Assessment/Plan: 1. Functional deficits which require 3+ hours per day of interdisciplinary therapy in a comprehensive inpatient rehab setting. Physiatrist is providing close team supervision and 24 hour management of active medical problems listed below. Physiatrist and rehab team continue to assess barriers to discharge/monitor  patient progress toward functional and medical goals  Care Tool:  Bathing    Body parts bathed by patient: Chest, Abdomen, Front perineal area, Buttocks, Right upper leg, Left upper leg, Face   Body parts bathed by helper: Right arm, Left arm, Left lower leg, Right lower leg     Bathing assist Assist Level: Moderate Assistance - Patient 50 - 74%     Upper Body Dressing/Undressing Upper body dressing   What is the patient wearing?: Pull over shirt    Upper body assist Assist Level: Minimal Assistance - Patient > 75%    Lower Body Dressing/Undressing Lower body dressing      What is the patient wearing?: Underwear/pull up, Pants     Lower body assist Assist for lower body dressing: Minimal Assistance - Patient > 75%     Toileting Toileting    Toileting assist Assist for toileting: Moderate Assistance - Patient 50 - 74%     Transfers Chair/bed transfer  Transfers assist  Chair/bed transfer activity did not occur: Safety/medical concerns (unsafe to get up)  Chair/bed transfer assist level: Moderate Assistance - Patient 50 - 74%     Locomotion Ambulation   Ambulation assist      Assist level: Moderate Assistance - Patient 50 - 74%  Assistive device: Walker-rolling Max distance: 24ft   Walk 10 feet activity   Assist     Assist level: Moderate Assistance - Patient - 50 - 74% Assistive device: Walker-rolling   Walk 50 feet activity   Assist Walk 50 feet with 2 turns activity did not occur: Safety/medical concerns         Walk 150 feet activity   Assist Walk 150 feet activity did not occur: Safety/medical concerns         Walk 10 feet on uneven surface  activity   Assist Walk 10 feet on uneven surfaces activity did not occur: Safety/medical concerns         Wheelchair     Assist Is the patient using a wheelchair?: Yes (for transport at this time) Type of Wheelchair: Manual (used scooter at baseline)    Wheelchair assist  level: Dependent - Patient 0%      Wheelchair 50 feet with 2 turns activity    Assist        Assist Level: Dependent - Patient 0%   Wheelchair 150 feet activity     Assist      Assist Level: Dependent - Patient 0%   Blood pressure 133/88, pulse 66, temperature 97.9 F (36.6 C), resp. rate 18, height 5\' 10"  (1.778 m), weight 75.9 kg, SpO2 93%.  Medical Problem List and Plan: 1. 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  Status post loop recorder placement 05/19/2023             -patient may  shower             -ELOS/Goals: 7-10 Sup PT/OT/SLP            -Continue CIR therapies including PT, OT, and SLP  2.  Antithrombotics: -DVT/anticoagulation:  Pharmaceutical: Lovenox.  Venous Doppler studies negative             -antiplatelet therapy: Aspirin 81 mg daily and Plavix 75 mg day x 3 weeks then aspirin alone 3. Pain Management: Tylenol as needed 4. Mood/Behavior/Sleep: Provide emotional support             -antipsychotic agents: N/A 5. Neuropsych/cognition: This patient may be capable of making decisions on his own behalf. 6. Skin/Wound Care: Routine skin checks 7. Fluids/Electrolytes/Nutrition: encourage PO  -continue protein supp for low albumin 8.  Hyperlipidemia.  Continue Crestor  9.  Previous NPH workup/cognitive impairment.  Followed outpatient by neurology service Dr. Vickey Huger.  B12, TSH, ESR, RPR normal. -Continue f/u with Dr. Vickey Huger and [facility] neurology as outpatient -Uses Wheelchair/Scooter at home  10.  Left CCA stenosis.  CTA head and neck 60% stenosis distal left CCA secondary to plaque with ulceration. -Outpatient f/u with vascular surgery  11.  Hypertension- ? Diagnosis . BPs normal except for occ elevation , will monitor .  Patient on no antihypertensive medications prior to admission. Vitals:   05/25/23 2006 05/26/23 0533  BP: 128/86 133/88  Pulse: 70 66  Resp:  18 18  Temp: 99 F (37.2 C) 97.9 F (36.6 C)  SpO2: 94% 93%     12.  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  Follow-up outpatient. Consider Qutenza. Topamax started at night  13. Leukocytosis: due to UTI    Latest Ref Rng & Units 05/26/2023    5:21 AM 05/23/2023    6:47 AM 05/22/2023    6:18 AM  CBC  WBC 4.0 - 10.5  K/uL 7.2  10.5  19.3   Hemoglobin 13.0 - 17.0 g/dL 29.5  62.1  30.8   Hematocrit 39.0 - 52.0 % 41.3  40.6  40.8   Platelets 150 - 400 K/uL 316  238  257      14. Headache:Resolved topamax started at night and prn Fioricet ordered for the day    LOS: 5 days A FACE TO FACE EVALUATION WAS PERFORMED  Erick Colace 05/26/2023, 10:38 AM

## 2023-05-26 NOTE — Progress Notes (Signed)
Patient ID: Jared [name]., male   DOB: 04-30-1941, 83 y.o.   MRN: [id number]  [id number] returned phone call to pt wife Jared [name]  to introduce self, explain role, discuss discharge process, and inform on ELOS. She inquired about pt upcoming VAS appt to neurologist and waiting a long time. SW shared we are unable to transport to this appointment. She has been encouraged to reschedule.   Cecile Sheerer, MSW, LCSW Office: [phone] Cell: [phone] Fax: [phone]

## 2023-05-26 NOTE — Progress Notes (Signed)
Occupational Therapy Session Note  Patient Details  Name: Jared [name]. MRN: [id number] Date of Birth: 03-20-1941  Today's Date: 05/26/2023 OT Individual Time: 1478-2956 OT Individual Time Calculation (min): 20 min    Short Term Goals: Week 1:  OT Short Term Goal 1 (Week 1): STG=LTG due to LOS  Skilled Therapeutic Interventions/Progress Updates:  Pt greeted supine in bed, pt seen for make up time with pt agreeable to OT intervention.      Transfers/bed mobility/functional mobility: pt completed supine>sit with CGA, sit>stand from EOB with Rw and MIN A, verbal cues needed for hand placement during sit>stand. Stand pivot to w/c with RW and CGA, MIN verbal cues to sequence pivotal steps.    ADLs:  Grooming: pt completed seated oral care at sink with supervision.  UB dressing:pt donned [place] shirt from EOB with set- up assist  LB dressing: CGA to don pants from EOB, CGA provided for balance only when standing to pull pants to waist line Footwear: set- up to don slip on shoes  Transfers: pt completed stand pivot to [facility] over toilet with MIN A and use of grab bars, MIN verbal cues needed to sequence pivotal steps to toilet Toileting: MIN A provided for 3/3 toileting tasks d/t urgency, assist needed for clothing mgmt only with pt able to complete his own pericare.   Ended session with pt seated in w/c with pt handed off to SLP directly.   Therapy Documentation Precautions:  Precautions Precautions: Other (comment), Fall Precaution Comments: lack of wt shift w/ shuffled gait and anterior lean; very delayed processing for all tasks Restrictions Weight Bearing Restrictions Per Provider Order: No  Pain: unrated pain reported from HA, no pain intervention needed as pt reports constant HA    Therapy/Group: Individual Therapy  [name] [facility] 05/26/2023, 8:12 AM

## 2023-05-26 NOTE — Progress Notes (Signed)
Physical Therapy Session Note  Patient Details  Name: Jared [name]. MRN: [id number] Date of Birth: [birth year]  {CHL IP REHAB PT TIME CALCULATION:304800500}  Short Term Goals: {ZYS:0630160}  Skilled Therapeutic Interventions/Progress Updates:      Therapy Documentation Precautions:  Precautions Precautions: Other (comment), Fall Precaution Comments: lack of wt shift w/ shuffled gait and anterior lean; very delayed processing for all tasks Restrictions Weight Bearing Restrictions Per Provider Order: No General:   Vital Signs:   Pain: Pain Assessment Pain Scale: 0-10 Pain Score: 0-No pain Mobility:   Locomotion :    Trunk/Postural Assessment :    Balance:   Exercises:   Other Treatments:      Therapy/Group: {Therapy/Group:3049007}  Loel Dubonnet 05/26/2023, 10:23 AM

## 2023-05-27 DIAGNOSIS — N3000 Acute cystitis without hematuria: Secondary | ICD-10-CM | POA: Diagnosis not present

## 2023-05-27 DIAGNOSIS — I639 Cerebral infarction, unspecified: Secondary | ICD-10-CM | POA: Diagnosis not present

## 2023-05-27 NOTE — Progress Notes (Signed)
Occupational Therapy Session Note  Patient Details  Name: Jared [name]. MRN: [id number] Date of Birth: [birth year]  Today's Date: 05/27/2023 OT Individual Time: 2130-8657 OT Individual Time Calculation (min): 60 min    Short Term Goals: Week 1:  OT Short Term Goal 1 (Week 1): STG=LTG due to LOS  Skilled Therapeutic Interventions/Progress Updates:    Pt received in bed and agreeable to a shower.  Pt cued to do a warm up arm and leg stretching exercise in bed prior to getting up as therapist gathered supplies. Pt actively used his arms and legs and then sat to EOB with supervision. See ADL documentation below. Overall pt did extremely well today following cues, demonstrating good initiation and attention.  He continues to need cues to not push the walker too far forward and to take a full step through with R foot.  Pt completed toileting, shower, dressing, standing at sink for over 5 minutes with supervision to complete oral care and hair care.  Pt then sat in w/c as PT coming for his next session. Hand off to PT.  Therapy Documentation Precautions:  Precautions Precautions: Other (comment), Fall Precaution Comments: lack of wt shift w/ shuffled gait and anterior lean; very delayed processing for all tasks Restrictions Weight Bearing Restrictions Per Provider Order: No   Pain: Pain Assessment Pain Score: 0-No pain ADL: ADL Eating: Set up Where Assessed-Eating: Chair Grooming: Supervision/safety Where Assessed-Grooming: Standing at sink Upper Body Bathing: Supervision/safety Where Assessed-Upper Body Bathing: Shower Lower Body Bathing: Supervision/safety Where Assessed-Lower Body Bathing: Shower Upper Body Dressing: Setup Where Assessed-Upper Body Dressing: Chair Lower Body Dressing: Minimal assistance (min A for socks only, did not use sock aid this am) Where Assessed-Lower Body Dressing: Sitting at sink, Standing at sink Toileting: Supervision/safety Where  Assessed-Toileting: Teacher, adult education: Furniture conservator/restorer Method: Proofreader: Grab bars, Raised toilet seat Tub/Shower Transfer: Unable to assess Geologist, engineering Method: Designer, industrial/product: Emergency planning/management officer, Grab bars ADL Comments: Patient maintains one hand at all times in support in sitting and standing   Therapy/Group: Individual Therapy  Bettie Capistran 05/27/2023, 12:23 PM

## 2023-05-27 NOTE — Progress Notes (Signed)
Patient ID: Jared [name]., male   DOB: 12/13/1940, 83 y.o.   MRN: [id number]  [facility]-  PCP- Dr. Chong Sicilian at our [place] [facility]. Primary Care social worker is Frazier Richards [phone] ext. [phone].   [id number] left message and sent email to above SW- [place] inquiring about service connection and benefits, also if w/c eval order is needed OR urgent DME request list. SW waiting on follow-up.   *SW informed pt is not service connected. If he requires any needs, will need to complete Urgent DME request list.   Cecile Sheerer, MSW, LCSW Office: [phone] Cell: [phone] Fax: [phone]

## 2023-05-27 NOTE — Progress Notes (Signed)
PROGRESS NOTE   Subjective/Complaints:  No issues overnite , just finished OT showering.  Did well no pain complaints.  ROS: Patient denies CP, SOB, N/V/D   Objective:   No results found.  Recent Labs    05/26/23 0521  WBC 7.2  HGB 14.0  HCT 41.3  PLT 316   Recent Labs    05/26/23 0521  NA 135  K 4.3  CL 103  CO2 25  GLUCOSE 156*  BUN 19  CREATININE 0.96  CALCIUM 8.9     Intake/Output Summary (Last 24 hours) at 05/27/2023 0835 Last data filed at 05/27/2023 0800 Gross per 24 hour  Intake 476 ml  Output 850 ml  Net -374 ml        Physical Exam: Vital Signs Blood pressure 122/80, pulse 62, temperature 98.4 F (36.9 C), resp. rate 16, height 5\' 10"  (1.778 m), weight 75.9 kg, SpO2 96%.   General: No acute distress Mood and affect are appropriate Heart: Regular rate and rhythm no rubs murmurs or extra sounds Lungs: Clear to auscultation, breathing unlabored, no rales or wheezes Abdomen: Positive bowel sounds, soft nontender to palpation, nondistended Extremities: No clubbing, cyanosis, or edema Skin: No evidence of breakdown, no evidence of rash Neurologic: Cranial nerves II through XII intact, motor strength is 5/5 in bilateral deltoid, bicep, tricep, grip, hip flexor, knee extensors, ankle dorsiflexor and plantar flexor Sensory exam normal sensation to light touch  bilateral upper and lower extremities Cerebellar exam normal finger to nose to finger as well as heel to shin in bilateral upper and lower extremities Mild dysdiadochokinesis with RAM RUE Mild aphasia mainly word finding issues Musculoskeletal: Full range of motion in all 4 extremities. No joint swelling   Assessment/Plan: 1. Functional deficits which require 3+ hours per day of interdisciplinary therapy in a comprehensive inpatient rehab setting. Physiatrist is providing close team supervision and 24 hour management of active medical  problems listed below. Physiatrist and rehab team continue to assess barriers to discharge/monitor patient progress toward functional and medical goals  Care Tool:  Bathing    Body parts bathed by patient: Chest, Abdomen, Front perineal area, Buttocks, Right upper leg, Left upper leg, Face   Body parts bathed by helper: Right arm, Left arm, Left lower leg, Right lower leg     Bathing assist Assist Level: Moderate Assistance - Patient 50 - 74%     Upper Body Dressing/Undressing Upper body dressing   What is the patient wearing?: Pull over shirt    Upper body assist Assist Level: Minimal Assistance - Patient > 75%    Lower Body Dressing/Undressing Lower body dressing      What is the patient wearing?: Underwear/pull up, Pants     Lower body assist Assist for lower body dressing: Minimal Assistance - Patient > 75%     Toileting Toileting    Toileting assist Assist for toileting: Moderate Assistance - Patient 50 - 74%     Transfers Chair/bed transfer  Transfers assist  Chair/bed transfer activity did not occur: Safety/medical concerns (unsafe to get up)  Chair/bed transfer assist level: Moderate Assistance - Patient 50 - 74%     Locomotion Ambulation  Ambulation assist      Assist level: Moderate Assistance - Patient 50 - 74% Assistive device: Walker-rolling Max distance: 57ft   Walk 10 feet activity   Assist     Assist level: Moderate Assistance - Patient - 50 - 74% Assistive device: Walker-rolling   Walk 50 feet activity   Assist Walk 50 feet with 2 turns activity did not occur: Safety/medical concerns         Walk 150 feet activity   Assist Walk 150 feet activity did not occur: Safety/medical concerns         Walk 10 feet on uneven surface  activity   Assist Walk 10 feet on uneven surfaces activity did not occur: Safety/medical concerns         Wheelchair     Assist Is the patient using a wheelchair?: Yes (for  transport at this time) Type of Wheelchair: Manual (used scooter at baseline)    Wheelchair assist level: Dependent - Patient 0%      Wheelchair 50 feet with 2 turns activity    Assist        Assist Level: Dependent - Patient 0%   Wheelchair 150 feet activity     Assist      Assist Level: Dependent - Patient 0%   Blood pressure 122/80, pulse 62, temperature 98.4 F (36.9 C), resp. rate 16, height 5\' 10"  (1.778 m), weight 75.9 kg, SpO2 96%.  Medical Problem List and Plan: 1. 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  Status post loop recorder placement 05/19/2023 Deficits of aphasia mainly relate to the Left parietal lesion              -patient may  shower             -ELOS/Goals: 7-10 Sup PT/OT/SLP            -Continue CIR therapies including PT, OT, and SLP   2.  Antithrombotics: -DVT/anticoagulation:  Pharmaceutical: Lovenox.  Venous Doppler studies negative             -antiplatelet therapy: Aspirin 81 mg daily and Plavix 75 mg day x 3 weeks then aspirin alone starting 06/05/23 3. Pain Management: Tylenol as needed 4. Mood/Behavior/Sleep: Provide emotional support             -antipsychotic agents: N/A 5. Neuropsych/cognition: This patient may be capable of making decisions on his own behalf. 6. Skin/Wound Care: Routine skin checks 7. Fluids/Electrolytes/Nutrition: encourage PO  -continue protein supp for low albumin 8.  Hyperlipidemia.  Continue Crestor  9.  Previous NPH workup/cognitive impairment.has bilateral periventricular white matter chronic infarcts   Followed outpatient by neurology service Dr. Vickey Huger.  B12, TSH, ESR, RPR normal. -Continue f/u with Dr. Vickey Huger and [facility] neurology as outpatient -Uses Wheelchair/Scooter at home  10.  Left CCA stenosis.  CTA head and neck 60% stenosis distal left CCA secondary to plaque with ulceration. -Outpatient f/u with  vascular surgery  11.  Hypertension- ? Diagnosis . BPs normal except for occ elevation , will monitor .  Patient on no antihypertensive medications prior to admission. Vitals:   05/26/23 1932 05/27/23 0442  BP: 118/85 122/80  Pulse: 72 62  Resp: 17 16  Temp: 98 F (36.7 C) 98.4 F (36.9 C)  SpO2: 94% 96%     12.  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  Follow-up outpatient. Consider Qutenza. Topamax started at night  13. Leukocytosis: due to UTI    Latest Ref Rng & Units 05/26/2023    5:21 AM 05/23/2023    6:47 AM 05/22/2023    6:18 AM  CBC  WBC 4.0 - 10.5 K/uL 7.2  10.5  19.3   Hemoglobin 13.0 - 17.0 g/dL 16.1  09.6  04.5   Hematocrit 39.0 - 52.0 % 41.3  40.6  40.8   Platelets 150 - 400 K/uL 316  238  257      14. Headache:Resolved topamax started at night and prn Fioricet ordered for the day    LOS: 6 days A FACE TO FACE EVALUATION WAS PERFORMED  Erick Colace 05/27/2023, 8:35 AM

## 2023-05-27 NOTE — Progress Notes (Signed)
Speech Language Pathology Daily Session Note  Patient Details  Name: Jared [name]. MRN: [id number] Date of Birth: [birth year]  Today's Date: 05/27/2023 SLP Individual Time: 1000-1059 SLP Individual Time Calculation (min): 59 min  Short Term Goals: Week 1: SLP Short Term Goal 1 (Week 1): Patient will demonstrate orientation to time, place, location and self with mod multimodal A SLP Short Term Goal 2 (Week 1): Patient will demonstrate basic problem solving abilities in functional daily situations given mod multimodal A SLP Short Term Goal 3 (Week 1): Patient will recall biographical information with mod multimodal A SLP Short Term Goal 4 (Week 1): Patient will demonstrate awareness of cognitive and physical changes with max multimodal A  Skilled Therapeutic Interventions: Skilled therapy session focused on cognitive goals. SLP faciliated session by reviewing schedule and providing maxA for patient to recall activities completed in prior therapies. Patient recalled showering this AM, however unable to recall any activities completed in prior session. Patient oriented to time, location, self and situation with modi A to utilize external aids around room. SLP continued to address cognitive goals through problem solving task. SLP instructed patient to count coins according to verbalized amount. Patient completed task with modA. Patient recalled activities completed in ST at the end of the session with independently. Patient left in chair with alarm set and call bell in reach. Continue POC.   Pain Denies   Therapy/Group: Individual Therapy  Kaitlynne Wenz M.A., CF-SLP 05/27/2023, 7:40 AM

## 2023-05-28 DIAGNOSIS — I639 Cerebral infarction, unspecified: Secondary | ICD-10-CM | POA: Diagnosis not present

## 2023-05-28 DIAGNOSIS — N3000 Acute cystitis without hematuria: Secondary | ICD-10-CM | POA: Diagnosis not present

## 2023-05-28 MED ORDER — POLYETHYLENE GLYCOL 3350 17 G PO PACK: 17.0000 g | PACK | Freq: Every day | ORAL | Status: DC

## 2023-05-28 MED ORDER — SENNOSIDES-DOCUSATE SODIUM 8.6-50 MG PO TABS: 1.0000 | ORAL_TABLET | Freq: Two times a day (BID) | ORAL | Status: DC

## 2023-05-28 MED ORDER — ACETAMINOPHEN 325 MG PO TABS: 650.0000 mg | ORAL_TABLET | ORAL | Status: DC | PRN

## 2023-05-28 NOTE — Progress Notes (Signed)
Physical Therapy Session Note  Patient Details  Name: Jared [name]. MRN: [id number] Date of Birth: 09/29/1940  Today's Date: 05/28/2023 PT Individual Time: 1355-1447 PT Individual Time Calculation (min): 52 min   Short Term Goals: Week 1:  PT Short Term Goal 1 (Week 1): Pt will perform supine<>sit with min A PT Short Term Goal 2 (Week 1): Pt will perform sit<>stands using LRAD with CGA PT Short Term Goal 3 (Week 1): Pt will consistently perform bed<>chair transfers using LRAD with min A PT Short Term Goal 4 (Week 1): Pt will ambulate at least 8ft using LRAD with min A PT Short Term Goal 5 (Week 1): Pt will participate in assessment of the need for a custom wheelchair to allow him increased independence with functional mobility in the home  Skilled Therapeutic Interventions/Progress Updates:    Pt presents in room in [facility], agreeable to make up session with PT. Pt denies pain at this time. Session focused on gait training for gait mechanics, RW mgmt, and turning. Pt completes sit<>stand transfers with RW supervision throughout session.  Pt transported to day room dependently via [facility] for time management and energy conservation. Pt ambulates 175' with RW CGA/min assist with demonstration festination gait, anterior lean with decreased proximity to RW, poor navigation of obstacles with RW requiring mod verbal cues and min assist for managing RW. Pt returns to sitting with pt demonstrating difficulty with turn to sit in [facility] with min assist for managing RW.  Pt then completes gait training 175' with RW with reverse duct tape applied to forefoot of bilateral shoes to provide auditory and tactile cue for BLE advance. Pt demonstrates improved step length and height as well as weightshifting bilaterally, continues to demonstrate shorter step length with RLE compared to LLE provided with min cues to correct, CGA for postural stability with pt demonstrating improved RW mgmt with gait although does require min  cues for proximity to RW.   Pt then completes gait training with "sticky feet" navigating around 3 cones x4 trials with RW, with CGA, verbal cues for RLE step length with turns to L with pt demonstrating wide turns. Pt requires seated rest break between trials for energy conservation. Pt then completes forward/backward walking x6' with RW and "sticky feet" demonstrating great step length, weightshift, and RW, improved with retro ambulation.  Pt then ambulates with RW without sticky feet, CGA 60' to assess carryover of intervention. Pt demonstrating improved RW mgmt and proximity to RW as well as improved LLE foot clearance consistently with gait trial, however continues to demonstrate decreased RLE foot clearance requiring mod cues to correct with pt able to correct with cues.  Pt remains seated in [facility] with handoff to PT in dayroom at end of session.  Therapy Documentation Precautions:  Precautions Precautions: Other (comment), Fall Precaution Comments: lack of wt shift w/ shuffled gait and anterior lean; very delayed processing for all tasks Restrictions Weight Bearing Restrictions Per Provider Order: No  Therapy/Group: Individual Therapy  Edwin Cap PT, DPT 05/28/2023, 3:05 PM

## 2023-05-28 NOTE — Progress Notes (Signed)
Physical Therapy Session Note  Patient Details  Name: Jared [name]. MRN: [id number] Date of Birth: 03-Nov-1940  Today's Date: 05/27/2023 PT Individual Time:  [phone]  PT Individual Time Calculation (min): 30 min  Short Term Goals: Week 1:  PT Short Term Goal 1 (Week 1): Pt will perform supine<>sit with min A PT Short Term Goal 2 (Week 1): Pt will perform sit<>stands using LRAD with CGA PT Short Term Goal 3 (Week 1): Pt will consistently perform bed<>chair transfers using LRAD with min A PT Short Term Goal 4 (Week 1): Pt will ambulate at least 75ft using LRAD with min A PT Short Term Goal 5 (Week 1): Pt will participate in assessment of the need for a custom wheelchair to allow him increased independence with functional mobility in the home  Skilled Therapeutic Interventions/Progress Updates:  Patient seated upright in w/c on entrance to room. Patient alert and agreeable to PT session.   Patient with no pain complaint at start of session.  Therapeutic Activity: Relates need to urinate and so pt performs ambulatory transfer to toilet. Is able to rise to stand from w/c with vc for hand placement to armrests and push to stand prior to grasp for RW. Is able to initiate stepping and brings BLE to red theraband that has been tied to RW for visual cue to increase step height/ length. Stands at toilet and is able to continently void. Return to w/c and reaches back to w/c to sit with supervision.   Discussion started re: pt's CLOF and potential to return home at end of week. Discussed potential need for motorized scooter or w/c in order to replace pt's current scooter. Will bring electric w/c to pt's session tomorrow. Discussion with [employer] from [employer] re: pt's potential need and potential need to work with [place] services on acquiring new w/c or scooter. Also discussed with CSW re: potential need for MD order in order to initiate w/c evaluation either while in IPR or in OP setting. CSW relates that if  [facility] will [facility], then they will perform evaluation. Will trial electric w/c with pt to determine with pt can safely manange with slower speed of chair with pt's current delay in processing.   Also discussed pt's steps to enter home and potential need for ramp or chair lift installation to bypass stairs. Relates to me that he and wife have initiated talks with [facility] in past to [facility] ramp. Will need to discuss with wife and son again. Meanwhile, will work with pt on ascending/ descending stairs in order to get home.   Patient seated upright in w/c at end of session with brakes locked, belt alarm set, and all needs within reach. Oriented pt to time and time of next therapy session.    Therapy Documentation Precautions:  Precautions Precautions: Other (comment), Fall Precaution Comments: lack of wt shift w/ shuffled gait and anterior lean; very delayed processing for all tasks Restrictions Weight Bearing Restrictions Per Provider Order: No  Pain:  No pain related this session.    Therapy/Group: Individual Therapy  Loel Dubonnet PT, DPT, CSRS 05/27/2023, 5:23 PM

## 2023-05-28 NOTE — Progress Notes (Signed)
Patient ID: Jared [name]., male   DOB: 10/14/1940, 83 y.o.   MRN: [id number]   1054- SW spoke with pt wife to provide updates from team conference, and d/c date 1/28. Fam edu scheduled for tomorrow 1p-68m. Wife would like ot know if he can be left home alone when she has errands. No additional supports and PRN support from son when he is available (family of 8;has six children).  SW shared updates from [place] and non service connected. SW will send aide referral to [place]. She has no HHA preference.  Cecile Sheerer, MSW, LCSW Office: [phone] Cell: [phone] Fax: [phone]

## 2023-05-28 NOTE — Progress Notes (Signed)
Physical Therapy Session Note  Patient Details  Name: Jared [name]. MRN: [id number] Date of Birth: Aug 16, 1940  Today's Date: 05/28/2023 PT Individual Time: 0900-1002 PT Individual Time Calculation (min): 62 min   Short Term Goals: Week 1:  PT Short Term Goal 1 (Week 1): Pt will perform supine<>sit with min A PT Short Term Goal 2 (Week 1): Pt will perform sit<>stands using LRAD with CGA PT Short Term Goal 3 (Week 1): Pt will consistently perform bed<>chair transfers using LRAD with min A PT Short Term Goal 4 (Week 1): Pt will ambulate at least 35ft using LRAD with min A PT Short Term Goal 5 (Week 1): Pt will participate in assessment of the need for a custom wheelchair to allow him increased independence with functional mobility in the home   Skilled Therapeutic Interventions/Progress Updates:  Patient supine in bed on entrance to room. Patient alert and agreeable to PT session.   Patient with no pain complaint at start of session.  Therapeutic Activity: Bed Mobility: Pt performed supine <> sit with ***. VC/ tc required for ***. Transfers: Pt performed sit<>stand and stand pivot transfers throughout session with ***. Provided vc/ tc for***.  Gait Training:  Pt ambulated *** ft using *** with ***. Demonstrated ***. Provided vc/ tc for ***.  Wheelchair Mobility:  Pt propelled wheelchair *** feet with ***. Provided vc/ tc for ***.  Neuromuscular Re-ed: NMR facilitated during session with focus on***. Pt guided in ***. NMR performed for improvements in motor control and coordination, balance, sequencing, judgement, and self confidence/ efficacy in performing all aspects of mobility at highest level of independence.   Patient *** at end of session with brakes locked, *** alarm set, and all needs within reach.  - standing at toilet to urinate with anterior lean - standing at sink to brush teeth, comb hair, wash hands - STS with vc for hand placement - gait training with pt  demonstrating decreased step height and length with difficulty maintaining correction following vc for increased knee height with improved heel to toe step progression.   Therapy Documentation Precautions:  Precautions Precautions: Other (comment), Fall Precaution Comments: lack of wt shift w/ shuffled gait and anterior lean; very delayed processing for all tasks Restrictions Weight Bearing Restrictions Per Provider Order: No  Pain:       Therapy/Group: Individual Therapy  Loel Dubonnet PT, DPT, CSRS 05/28/2023, 4:30 PM

## 2023-05-28 NOTE — Progress Notes (Signed)
PROGRESS NOTE   Subjective/Complaints:  No issues overnite , no pain c/os  ROS: Patient denies CP, SOB, N/V/D   Objective:   No results found.  Recent Labs    05/26/23 0521  WBC 7.2  HGB 14.0  HCT 41.3  PLT 316   Recent Labs    05/26/23 0521  NA 135  K 4.3  CL 103  CO2 25  GLUCOSE 156*  BUN 19  CREATININE 0.96  CALCIUM 8.9     Intake/Output Summary (Last 24 hours) at 05/28/2023 0827 Last data filed at 05/28/2023 0700 Gross per 24 hour  Intake 710 ml  Output 1050 ml  Net -340 ml        Physical Exam: Vital Signs Blood pressure 117/83, pulse 63, temperature 97.7 F (36.5 C), resp. rate 16, height 5\' 10"  (1.778 m), weight 75.9 kg, SpO2 95%.   General: No acute distress Mood and affect are appropriate Heart: Regular rate and rhythm no rubs murmurs or extra sounds Lungs: Clear to auscultation, breathing unlabored, no rales or wheezes Abdomen: Positive bowel sounds, soft nontender to palpation, nondistended Extremities: No clubbing, cyanosis, or edema Skin: No evidence of breakdown, no evidence of rash Neurologic: Cranial nerves II through XII intact, motor strength is 5/5 in bilateral deltoid, bicep, tricep, grip, hip flexor, knee extensors, ankle dorsiflexor and plantar flexor Sensory exam normal sensation to light touch  bilateral upper and lower extremities  Mild aphasia mainly word finding issues Musculoskeletal: Full range of motion in all 4 extremities. No joint swelling   Assessment/Plan: 1. Functional deficits which require 3+ hours per day of interdisciplinary therapy in a comprehensive inpatient rehab setting. Physiatrist is providing close team supervision and 24 hour management of active medical problems listed below. Physiatrist and rehab team continue to assess barriers to discharge/monitor patient progress toward functional and medical goals  Care Tool:  Bathing    Body parts  bathed by patient: Chest, Abdomen, Front perineal area, Buttocks, Right upper leg, Left upper leg, Face, Right arm, Left arm, Right lower leg, Left lower leg   Body parts bathed by helper: Right arm, Left arm, Left lower leg, Right lower leg     Bathing assist Assist Level: Supervision/Verbal cueing     Upper Body Dressing/Undressing Upper body dressing   What is the patient wearing?: Pull over shirt    Upper body assist Assist Level: Set up assist    Lower Body Dressing/Undressing Lower body dressing      What is the patient wearing?: Underwear/pull up, Pants     Lower body assist Assist for lower body dressing: Supervision/Verbal cueing     Toileting Toileting    Toileting assist Assist for toileting: Supervision/Verbal cueing     Transfers Chair/bed transfer  Transfers assist  Chair/bed transfer activity did not occur: Safety/medical concerns (unsafe to get up)  Chair/bed transfer assist level: Contact Guard/Touching assist     Locomotion Ambulation   Ambulation assist      Assist level: Moderate Assistance - Patient 50 - 74% Assistive device: Walker-rolling Max distance: 78ft   Walk 10 feet activity   Assist     Assist level: Moderate Assistance - Patient -  50 - 74% Assistive device: Walker-rolling   Walk 50 feet activity   Assist Walk 50 feet with 2 turns activity did not occur: Safety/medical concerns         Walk 150 feet activity   Assist Walk 150 feet activity did not occur: Safety/medical concerns         Walk 10 feet on uneven surface  activity   Assist Walk 10 feet on uneven surfaces activity did not occur: Safety/medical concerns         Wheelchair     Assist Is the patient using a wheelchair?: Yes (for transport at this time) Type of Wheelchair: Manual (used scooter at baseline)    Wheelchair assist level: Dependent - Patient 0%      Wheelchair 50 feet with 2 turns activity    Assist         Assist Level: Dependent - Patient 0%   Wheelchair 150 feet activity     Assist      Assist Level: Dependent - Patient 0%   Blood pressure 117/83, pulse 63, temperature 97.7 F (36.5 C), resp. rate 16, height 5\' 10"  (1.778 m), weight 75.9 kg, SpO2 95%.  Medical Problem List and Plan: 1. 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  Status post loop recorder placement 05/19/2023 Deficits of aphasia mainly relate to the Left parietal lesion              -patient may  shower             -ELOS/Goals: 7-10 Sup PT/OT/SLP            -Continue CIR therapies including PT, OT, and SLP  Team conference today please see physician documentation under team conference tab, met with team  to discuss problems,progress, and goals. Formulized individual treatment plan based on medical history, underlying problem and comorbidities.  2.  Antithrombotics: -DVT/anticoagulation:  Pharmaceutical: Lovenox.  Venous Doppler studies negative             -antiplatelet therapy: Aspirin 81 mg daily and Plavix 75 mg day x 3 weeks then aspirin alone starting 06/05/23 3. Pain Management: Tylenol as needed 4. Mood/Behavior/Sleep: Provide emotional support             -antipsychotic agents: N/A 5. Neuropsych/cognition: This patient may be capable of making decisions on his own behalf. 6. Skin/Wound Care: Routine skin checks 7. Fluids/Electrolytes/Nutrition: encourage PO  -continue protein supp for low albumin 8.  Hyperlipidemia.  Continue Crestor  9.  Previous NPH workup/cognitive impairment.has bilateral periventricular white matter chronic infarcts   Followed outpatient by neurology service Dr. Vickey Huger.  B12, TSH, ESR, RPR normal. -Continue f/u with Dr. Vickey Huger and [facility] neurology as outpatient -Uses Wheelchair/Scooter at home  10.  Left CCA stenosis.  CTA head and neck 60% stenosis distal left CCA secondary to plaque  with ulceration. -Outpatient f/u with vascular surgery  11.  Hypertension- ? Diagnosis . BPs normal except for occ elevation , will monitor .  Patient on no antihypertensive medications prior to admission. Vitals:   05/27/23 1955 05/28/23 0509  BP: (!) 140/96 117/83  Pulse: 62 63  Resp: 16 16  Temp: 98 F (36.7 C) 97.7 F (36.5 C)  SpO2: 95% 95%     12.  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  Follow-up outpatient. Consider Qutenza. Topamax started at night  13. Leukocytosis: due to UTI, resolved  Latest Ref Rng & Units 05/26/2023    5:21 AM 05/23/2023    6:47 AM 05/22/2023    6:18 AM  CBC  WBC 4.0 - 10.5 K/uL 7.2  10.5  19.3   Hemoglobin 13.0 - 17.0 g/dL 56.3  87.5  64.3   Hematocrit 39.0 - 52.0 % 41.3  40.6  40.8   Platelets 150 - 400 K/uL 316  238  257      14. Headache:Resolved topamax started at night and prn Fioricet ordered for the day   15.  UTI pansensitive e coli , will enc hydration, cont keflex x 5 d LOS: 7 days A FACE TO FACE EVALUATION WAS PERFORMED  Erick Colace 05/28/2023, 8:27 AM

## 2023-05-28 NOTE — Progress Notes (Signed)
Occupational Therapy Session Note  Patient Details  Name: Jared [name]. MRN: [id number] Date of Birth: May 12, 1940  Today's Date: 05/28/2023 OT Individual Time: 1036-1206 OT Individual Time Calculation (min): 90 min    Short Term Goals: Week 1:  OT Short Term Goal 1 (Week 1): pt will be able to don his pants with supervision. OT Short Term Goal 2 (Week 1): Pt will be able to don his socks with supervision using sock aid. OT Short Term Goal 3 (Week 1): pt will complete toilet transfers with CGA. OT Short Term Goal 4 (Week 1): pt will complete shower transfers with CGA.  Skilled Therapeutic Interventions/Progress Updates:    Pt received in w/c ready to participate in therapy.  Pt called his wife so I could talk with her about having her take photos of bathroom and send them to his phone so I could determine what types of equipment pt would benefit from.  She sent the photos of the 2 bathrooms.  Pt will need a shower seat and a BSC.  The bathroom doorways are too narrow to fit a RW of 19 inches as the doors are 17-18 inches.    Pt taken to tub room bathroom to set up a simulation where he would position scooter or power w/c just outside door so he could place his feet across the threshold, then stand to reach to RW inside threshold.  Pt practiced this transfer and then stepped to a chair in the same position as his toilet at home.  Pt would also benefit from a grab bar along back wall to the R of the toilet.  He was able to do this and return to wc in same manner with CGA and min cues to fully advance R foot.   Pt repeated this transfer at end of session in his room as he did need to toilet.  Pt then taken to gym to work on UE strengthening with 2 lb dowel bar focusing on overhead reaching.   Most of the session spent working in standing at parallel bars with pt alternating toe taps onto a small step and then integrating stepping up R, L and down R, L. Pt following cues well of 10x, 5x, 2x each,  now alternate.  Good clearance of R foot stepping forward.     Pt returned to room to toilet. Pt stood at toilet to urinate, ambulated to w/c outside of door (practicing transfer he will need to do at home),  sat in wc to wash hands at sink.  Pt resting in w/c with all needs met. Alarm set and call light in reach.      Therapy Documentation Precautions:  Precautions Precautions: Other (comment), Fall Precaution Comments: lack of wt shift w/ shuffled gait and anterior lean; very delayed processing for all tasks Restrictions Weight Bearing Restrictions Per Provider Order: No   Pain: Pain Assessment Pain Score: 0-No pain ADL: ADL Eating: Set up Where Assessed-Eating: Chair Grooming: Supervision/safety Where Assessed-Grooming: Standing at sink Upper Body Bathing: Supervision/safety Where Assessed-Upper Body Bathing: Shower Lower Body Bathing: Supervision/safety Where Assessed-Lower Body Bathing: Shower Upper Body Dressing: Setup Where Assessed-Upper Body Dressing: Chair Lower Body Dressing: Minimal assistance (min A for socks only, did not use sock aid this am) Where Assessed-Lower Body Dressing: Sitting at sink, Standing at sink Toileting: Supervision/safety Where Assessed-Toileting: Teacher, adult education: Furniture conservator/restorer Method: Proofreader: Grab bars, Raised toilet seat Tub/Shower Transfer: Unable to assess Teacher, music  guard Film/video editor Method: Designer, industrial/product: Emergency planning/management officer, Grab bars ADL Comments: Patient maintains one hand at all times in support in sitting and standing   Therapy/Group: Individual Therapy  Tayva Easterday 05/28/2023, 12:29 PM

## 2023-05-28 NOTE — Progress Notes (Signed)
Physical Therapy Session Note  Patient Details  Name: Jared [name]. MRN: [id number] Date of Birth: [birth year]  Today's Date: 05/28/2023 PT Individual Time:  -      Short Term Goals: Week 1:  PT Short Term Goal 1 (Week 1): Pt will perform supine<>sit with min A PT Short Term Goal 2 (Week 1): Pt will perform sit<>stands using LRAD with CGA PT Short Term Goal 3 (Week 1): Pt will consistently perform bed<>chair transfers using LRAD with min A PT Short Term Goal 4 (Week 1): Pt will ambulate at least 62ft using LRAD with min A PT Short Term Goal 5 (Week 1): Pt will participate in assessment of the need for a custom wheelchair to allow him increased independence with functional mobility in the home  Skilled Therapeutic Interventions/Progress Updates:  Patient supine in bed on entrance to room. Wife present. Patient alert and agreeable to PT session.   Patient with no pain complaint at start of session.  Discussion with wife re: her thoughts on need for ramp/ lift and also potential need for new w/c/ scooter for pt.   Related that with pt's delayed processing and variable quality of gait, pt will require continued use of scooter or new scooter/ motorized w/c to maintain mobility in and out of home. With ability to transport chair, pt will also have improved ability to mobilize in community.   Therapeutic Activity: Bed Mobility: Pt performed supine <> sit with supervision/ Mod I. No cueing for technique.  Transfers: Pt performed sit<>stand transfers with supervision/ light CGA and cues for proper hand placement to push from seat as pt tending to place hands on RW for attempt to stand. Stand pivot transfers throughout session with  supervision and min vc for technique.   Gait Training:  Pt ambulated 215 ft using RW with CGA. Continues to require vc and visual cue of red theraband on RW for increased step height/ length and heel strike bilaterally.  Provided vc throughout for step height/  length and addition of tc at anterior shoulder and low back for upright posture. With fatigue, pt tends to lean forward and also shortens steps. Education provided to pt and wife re: increase in pace with forward lean as pt is basically using stepping strategy in order to maintain balance and keep from falling. But, if pt or wife notice the decrease in step length/ height, better strategy is to stop ambulation, regain upright posture/ balance, mentally practice stepping forward with heel strikes and large steps prior to starting to ambulate again. Pt then able to improve performance with continued vc for maintaining increased step height/ length.   During ambulation, pt guided in attempt for lateral stepping while performing activity to match beanbags to color discs on separate table. Pt preferring to ambulate forward after initiating sidestepping with use of RW. Performs well with good anterior lean to complete reaches. Performs with close supervision.   Patient supine in bed at end of session with brakes locked, bed alarm set, and all needs within reach.   Therapy Documentation Precautions:  Precautions Precautions: Other (comment), Fall Precaution Comments: lack of wt shift w/ shuffled gait and anterior lean; very delayed processing for all tasks Restrictions Weight Bearing Restrictions Per Provider Order: No  Pain:  No pain related this session.   Therapy/Group: Individual Therapy  Loel Dubonnet PT, DPT, CSRS 05/27/2023, 2:38 PM

## 2023-05-28 NOTE — Progress Notes (Signed)
Physical Therapy Session Note  Patient Details  Name: Jared [name]. MRN: [id number] Date of Birth: [birth year]  Today's Date: 05/28/2023 PT Individual Time: 4098-1191 PT Individual Time Calculation (min): 58 min   Short Term Goals: Week 1:  PT Short Term Goal 1 (Week 1): Pt will perform supine<>sit with min A PT Short Term Goal 2 (Week 1): Pt will perform sit<>stands using LRAD with CGA PT Short Term Goal 3 (Week 1): Pt will consistently perform bed<>chair transfers using LRAD with min A PT Short Term Goal 4 (Week 1): Pt will ambulate at least 59ft using LRAD with min A PT Short Term Goal 5 (Week 1): Pt will participate in assessment of the need for a custom wheelchair to allow him increased independence with functional mobility in the home  Skilled Therapeutic Interventions/Progress Updates:   Pt received sitting in wc as hand-off from prior PT. Pt agreeable to continue with this therapy session. Primary PT and pt requesting to trial power wheelchair this afternoon.  R stand pivot w/c>PWC using RW with CGA for safety/steadying and cuing to turn fully, keeping AD close to him, prior to initiating sitting - pt continues to have poor L weight shift causing poor R LE foot clearance and ability to step R LE during swing advancement when transferring.  Therapist educated pt on the following regarding safe use of PWC:  - use of seat belt for safety while in PWC - educated on ensuring PWC is turned off any time he is not actively mobilizing in it - educated on use of indoor mobility setting on lowest speed when inside (used this throughout session except advanced to indoor level 2 speed down long open hallway) - educated on how to adjust seat features including: tilt, leg rests, and seat elevation and discussed that depending on his needs will determine which features he is eligible for (did not use the quick buttons in event pt is not able to get those on his w/c) -  educated on front wheel vs  mid-wheel vs rear-wheel drive wheelchair options (currently using a front wheel)  Pt participated in PWC mobility out of room, through dayroom (including navigating in/around tighter spaces of chairs and equipment) then down to ADL apartment - on indoor slowest speed setting primarily (only bumped up 1 speed in indoor mode during long hallway navigation). Once in ADL apartment, pt with greater difficulty navigating smaller spaces and requires max cuing to avoid bumping back of w/c into objects (educated him on having to accommodate his movements to allow that extra space behind him).   **Noticed when pt attempting to navigate through the different "modes" on the w/c he would often bump into the joystick or be slightly moving the joystick causing the PWC to move with decreased awareness of this -- may need to discuss different control options to allow increased safety with this.   Pt may also drive a mid-wheel PWC better than a front-wheel so he can make smaller, tighter turns?   Pt reports urgent need to use bathroom - transported back dependently.   Sit>stand PWC>RW with CGA/SBA - performed 3/3 toileting tasks using urinal with SBA for balance safety. Pt requesting to return to bed. Short distance ~39ft ambulatory transfer using RW as described above with pt continuing to have poor R foot clearance and poor motor planning to turn fully keeping AD with him prior to initiating sitting. Sit>supine with supervision. Pt left supine in bed with needs in reach, bed alarm on, and  NT arriving.     Therapy Documentation Precautions:  Precautions Precautions: Other (comment), Fall Precaution Comments: lack of wt shift w/ shuffled gait and anterior lean; very delayed processing for all tasks Restrictions Weight Bearing Restrictions Per Provider Order: No   Pain: No reports of pain throughout session.    Therapy/Group: Individual Therapy  Ginny Forth , PT, DPT, NCS, CSRS 05/28/2023, 2:47 PM

## 2023-05-29 DIAGNOSIS — G912 (Idiopathic) normal pressure hydrocephalus: Secondary | ICD-10-CM | POA: Diagnosis not present

## 2023-05-29 DIAGNOSIS — I639 Cerebral infarction, unspecified: Secondary | ICD-10-CM | POA: Diagnosis not present

## 2023-05-29 DIAGNOSIS — N3000 Acute cystitis without hematuria: Secondary | ICD-10-CM | POA: Diagnosis not present

## 2023-05-29 NOTE — Patient Care Conference (Signed)
Inpatient RehabilitationTeam Conference and Plan of Care Update Date: 05/28/2023   Time: 10:10 AM    Patient Name: Jared [name].      Medical Record Number: [id number]  Date of Birth: [birth year] Sex: Male         Room/Bed: 4W10C/4W10C-01 Payor Info: Payor: [employer] MEDICARE / Plan: [employer] MEDICARE CHOICE PPO / Product Type: *No Product type* /    Admit Date/Time:  05/21/2023  5:37 PM  Primary Diagnosis:  Cardioembolic stroke [facility])  Hospital Problems: Principal Problem:   Cardioembolic stroke [facility])    Expected Discharge Date: Expected Discharge Date: 06/03/23  Team Members Present: Physician leading conference: Dr. Claudette Laws Social Worker Present: Cecile Sheerer, LCSWA Nurse Present: Chana Bode, RN;Djeneba Barsch Trilby Drummer, RN PT Present: Ralph Leyden, PT OT Present: Primitivo Gauze, OT SLP Present: Pablo Lawrence, SLP PPS Coordinator present : Fae Pippin, SLP     Current Status/Progress Goal Weekly Team Focus  Bowel/Bladder   Patient is currently continent B/B  LBM 05/26/23   Will remain continent with B/B   Assist with toileting needs qshift/prn    Swallow/Nutrition/ Hydration               ADL's   min A for socks only,  CGA with ambulation with RW in and out of bathroom. is meeting goals for toileting. supervision with bathing and UB dressing.  improving balance, endurance, focus on tasks.   min A LB dressing,  supervision with toileting, dynamic standing, sit to stand   ADL training, functional mobility, pt/family education -problem solving with spouse bathroom accessibilty as scooter does not fit in bathroom    Mobility   Bed mobility = supervision/ Mod I, sit<>stand = close supervision/ CGA, stand pivot = uses RW with light CGA, ambulation = CGA d/t tendency for Parkinsonian gait pattern and requires consistent cues for increasing step height/ length   Bed mobility and transfers at supervision level, ambulation goals at [facility], motorized w/c  mobility at supervision level  Barriers: slow to process, motor control of LLE>RLE, tends to toe walk and has parkinsonian gait pattern /// Work on: L sided NMR, standing balance, motor control, improving quality of gait, family Publishing copy Observations  min-modA depending on fatigue, intermittent confusion   min-modA (may be able to upgrade goals)   memory, awareness of deficits, basic problem solving    Pain   Denies pain on this shift   Will free from pain   Assess pt for pain qshift/prn and provide education on pain medication    Skin   Pt skin is currently intact   Will maintain skin intergrity with no breakdown  Assess pt skin qshift/prn for breakdown and provide education to prevent skin breakdown      Discharge Planning:  Pt will discharge to home with wife. SW will confirm there are no barriers to discharge.   Team Discussion: Cardioembolic stroke. Normal pressure hydrocephalus with stable blood pressure. Aphasia. UTI. Working on pain control. Addressing constipation. Uses scooter at home.  Has steps into home and will need to work on getting ramp or other equipment to get in/out of home. Patient is slow to process with intermittent confusion at times.  This morning step cues were not working for step pattern and was leaning forward into walker. Working on gait pattern, standing balance, AE, and ongoing family education with spouse to problem  solve bathroom accessibility. ST is working on memory, awareness, and basic problem solving.  Patient on target to meet rehab goals: yes, continues to work towards goals with ST possibly upgrading some goals. Discharge date of 06/03/2023  *See Care Plan and progress notes for long and short-term goals.   Revisions to Treatment Plan:  Keflex added.  Topamax added. Needs w/c evaluation. Trial motor chair. Monitor labs and VS.  Teaching Needs: Medications, safety, self care,  transfers, toileting, communication, etc.   Current Barriers to Discharge: Inaccessible home environment, Decreased caregiver support, Home enviroment access/layout, and Incontinence  Possible Resolutions to Barriers: Family education Ramp/lift into home Return to normal bladder function pattern prior to admission Order recommended DME     Medical Summary Current Status: labile BPs, UTI, post CVA HA, constipation  Barriers to Discharge: Medical stability   Possible Resolutions to Barriers/Weekly Focus: complete abx for UTI, monitor BP , adjust meds   Continued Need for Acute Rehabilitation Level of Care: The patient requires daily medical management by a physician with specialized training in physical medicine and rehabilitation for the following reasons: Direction of a multidisciplinary physical rehabilitation program to maximize functional independence : Yes Medical management of patient stability for increased activity during participation in an intensive rehabilitation regime.: Yes Analysis of laboratory values and/or radiology reports with any subsequent need for medication adjustment and/or medical intervention. : Yes   I attest that I was present, lead the team conference, and concur with the assessment and plan of the team.   Jearld Adjutant 05/29/2023, 9:13 AM

## 2023-05-29 NOTE — Progress Notes (Signed)
Physical Therapy Session Note  Patient Details  Name: Jared [name]. MRN: [id number] Date of Birth: [birth year]  Today's Date: 05/29/2023 PT Individual Time: 1022-1105; 1353 - 1438 PT Individual Time Calculation (min): 43 min; 45 min   Short Term Goals: Week 1:  PT Short Term Goal 1 (Week 1): Pt will perform supine<>sit with min A PT Short Term Goal 2 (Week 1): Pt will perform sit<>stands using LRAD with CGA PT Short Term Goal 3 (Week 1): Pt will consistently perform bed<>chair transfers using LRAD with min A PT Short Term Goal 4 (Week 1): Pt will ambulate at least 35ft using LRAD with min A PT Short Term Goal 5 (Week 1): Pt will participate in assessment of the need for a custom wheelchair to allow him increased independence with functional mobility in the home  SESSION 1 Skilled Therapeutic Interventions/Progress Updates: Patient supine in bed on entrance to room. Patient alert and agreeable to PT session.   Patient reported no pain during session.  Therapeutic Activity: Bed Mobility: Pt performed supine<>sit on EOB with supervision and use of bed features Transfers: Pt performed sit<>stand transfers throughout session with minA (RW not within reach to avoid pt using B UE to pull self up). Provided VC for anterior weight shift and to power through with big movement.  Pt ambulated short distance from EOB to toilet in RW with CGA. Pt with decreased step length and shuffle-like steps without full connection of B heels on floor. Pt required VC to increase step length (max cuing for "BIG" steps) and to increase hip extension per forward flexed posture. Pt required increased time to ambulates less than 15'  PWC Navigation:  - Pt navigated room, hallway, and gym areas with PWC and with supervision/CGA to manage change of modes. Pt had no issues with navigating obstacles in hallway, but required minA to reverse PWC when cued to line up chair in front of stairs as pt had decreased awareness of  how close step was to front of B feet, and required PTA to intervene to avoid injury (foot assessed with pt not reporting any pain or complaints).  Pt also educated to turn off PWC when transferring to avoid accidentally hitting drive stick, and to increase awareness of location of drive stick when changing speed as pt would accidentally hit it with 5th digit.  Pt also positioned PWC to transfer to EOB at end of session with supervision with VC for closer distancing.   Stair Navigation: - Ascending 4 (6") steps with use of B UE on L HR, and with CGA. Pt with lateral step-to pattern - Descending 4 (6") steps with use of B UE on R HR, and with CGA. Pt with lateral step-to pattern   Pt with VC to avoid stepping further back away from hand railing as it leads to increased out of BOS reaching and forward flexed posture.   Patient supine in bed at end of session with brakes locked, bed alarm set, and all needs within reach.  SESSION 2 Skilled Therapeutic Interventions/Progress Updates: Patient handed off from OT session with family present. Patient alert and agreeable to PT session.   Patient reported no pain. Session focused on hands on training with pt's wife and son with education on pt's safety, CLOF, and d/c home questions/concerns. Pt transported to main gym in [facility] to practice transfer from WC<hi/low mat with PTA providing education and demonstration with technique and VC's (light CGA). Pt wife facilitated transfer from edge of mat to  short distance to stairs with CGA, and PTA providing close supervision/light CGA for safety. Pt required VC for "BIG" steps per shuffle-like gait pattern presentation with pt's wife assisting with VC's (pt also cued to step to red theraband drawn closer to LE's with better step length noted). PTA provided CGA to navigate stairs (4, 6" steps) with L HR (B UE support), and VC to step R foot to touch bottom of next step to ensure spacing for L LE to advance (45* lateral step  up). Pt then required minA to pivot at the top 180* with VC for placement of LE's and to increase step length. Pt descended with R HR and B UE support (CGA) with VC to step further out onto step with L LE to make space for R LE wile descending. Pt's wife facilitated 2nd round with CGA and providing same cues on both ascending and descending portions. Pt family further educated on to ensure that pt does not extend leading LE when descending to far back as to avoid increase in forward flexed posture and out of BOS reaching. Pt transported back to room in [facility] dependently as pt family stated that bed at home is higher than typical. Pt transferred to EOB with CGA from [facility] with pt's wife facilitating on R side with education that pt's R LE will occasionally get "stuck" and require increase cues to advance with increased clearance and length. Pt then CGA to transition from EOB to supine with VC for sequence (bed flat and no railing use). Pt will require more sessions with emphasis on bed mobility to mimic specifications of household. Pt's son that he might be able to adjust and lower height of bed with PTA recommending doing so if able. Pt family also educated on increasing awareness to tripping hazards in home such as rugs, animals, and toys.  Patient supine in bed at end of session with hand off to SLP with family present.        Therapy Documentation Precautions:  Precautions Precautions: Other (comment), Fall Precaution Comments: lack of wt shift w/ shuffled gait and anterior lean; very delayed processing for all tasks Restrictions Weight Bearing Restrictions Per Provider Order: No  Therapy/Group: Individual Therapy  Blessin Kanno PTA 05/29/2023, 12:21 PM

## 2023-05-29 NOTE — Progress Notes (Addendum)
PROGRESS NOTE   Subjective/Complaints:  Had a poor PT session yesterday am  but did well with OT  Reportedly used scooter at home primarily for locomotion, pt states his scooter is "rickety" PT is trying Power wheelchair given his fall risk   ROS: Patient denies CP, SOB, N/V/D   Objective:   No results found.  No results for input(s): "WBC", "HGB", "HCT", "PLT" in the last 72 hours.  No results for input(s): "NA", "K", "CL", "CO2", "GLUCOSE", "BUN", "CREATININE", "CALCIUM" in the last 72 hours.    Intake/Output Summary (Last 24 hours) at 05/29/2023 0804 Last data filed at 05/29/2023 0738 Gross per 24 hour  Intake 834 ml  Output 750 ml  Net 84 ml        Physical Exam: Vital Signs Blood pressure 133/78, pulse 61, temperature 98.3 F (36.8 C), resp. rate 17, height 5\' 10"  (1.778 m), weight 75.9 kg, SpO2 94%.   General: No acute distress Mood and affect are appropriate Heart: Regular rate and rhythm no rubs murmurs or extra sounds Lungs: Clear to auscultation, breathing unlabored, no rales or wheezes Abdomen: Positive bowel sounds, soft nontender to palpation, nondistended Extremities: No clubbing, cyanosis, or edema Skin: No evidence of breakdown, no evidence of rash Neurologic: Cranial nerves II through XII intact, motor strength is 5/5 in bilateral deltoid, bicep, tricep, grip, hip flexor, knee extensors, ankle dorsiflexor and plantar flexor Sensory exam normal sensation to light touch  bilateral upper and lower extremities  Mild aphasia mainly word finding issues Musculoskeletal: Full range of motion in all 4 extremities. No joint swelling   Assessment/Plan: 1. Functional deficits which require 3+ hours per day of interdisciplinary therapy in a comprehensive inpatient rehab setting. Physiatrist is providing close team supervision and 24 hour management of active medical problems listed below. Physiatrist and  rehab team continue to assess barriers to discharge/monitor patient progress toward functional and medical goals  Care Tool:  Bathing    Body parts bathed by patient: Chest, Abdomen, Front perineal area, Buttocks, Right upper leg, Left upper leg, Face, Right arm, Left arm, Right lower leg, Left lower leg   Body parts bathed by helper: Right arm, Left arm, Left lower leg, Right lower leg     Bathing assist Assist Level: Supervision/Verbal cueing     Upper Body Dressing/Undressing Upper body dressing   What is the patient wearing?: Pull over shirt    Upper body assist Assist Level: Set up assist    Lower Body Dressing/Undressing Lower body dressing      What is the patient wearing?: Underwear/pull up, Pants     Lower body assist Assist for lower body dressing: Supervision/Verbal cueing     Toileting Toileting    Toileting assist Assist for toileting: Supervision/Verbal cueing     Transfers Chair/bed transfer  Transfers assist  Chair/bed transfer activity did not occur: Safety/medical concerns (unsafe to get up)  Chair/bed transfer assist level: Contact Guard/Touching assist     Locomotion Ambulation   Ambulation assist      Assist level: Moderate Assistance - Patient 50 - 74% Assistive device: Walker-rolling Max distance: 43ft   Walk 10 feet activity   Assist  Assist level: Moderate Assistance - Patient - 50 - 74% Assistive device: Walker-rolling   Walk 50 feet activity   Assist Walk 50 feet with 2 turns activity did not occur: Safety/medical concerns         Walk 150 feet activity   Assist Walk 150 feet activity did not occur: Safety/medical concerns         Walk 10 feet on uneven surface  activity   Assist Walk 10 feet on uneven surfaces activity did not occur: Safety/medical concerns         Wheelchair     Assist Is the patient using a wheelchair?: Yes (for transport at this time) Type of Wheelchair: Manual (used  scooter at baseline)    Wheelchair assist level: Dependent - Patient 0%      Wheelchair 50 feet with 2 turns activity    Assist        Assist Level: Dependent - Patient 0%   Wheelchair 150 feet activity     Assist      Assist Level: Dependent - Patient 0%   Blood pressure 133/78, pulse 61, temperature 98.3 F (36.8 C), resp. rate 17, height 5\' 10"  (1.778 m), weight 75.9 kg, SpO2 94%.  Medical Problem List and Plan: 1. 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  Status post loop recorder placement 05/19/2023 Deficits of aphasia mainly relate to the Left parietal lesion              -patient may  shower             -ELOS/Goals:1/28 Sup PT/OT/SLP            -Continue CIR therapies including PT, OT, and SLP    2.  Antithrombotics: -DVT/anticoagulation:  Pharmaceutical: Lovenox.  Venous Doppler studies negative             -antiplatelet therapy: Aspirin 81 mg daily and Plavix 75 mg day x 3 weeks then aspirin alone starting 06/05/23 3. Pain Management: Tylenol as needed 4. Mood/Behavior/Sleep: Provide emotional support             -antipsychotic agents: N/A 5. Neuropsych/cognition: This patient may be capable of making decisions on his own behalf. 6. Skin/Wound Care: Routine skin checks 7. Fluids/Electrolytes/Nutrition: encourage PO  -continue protein supp for low albumin 8.  Hyperlipidemia.  Continue Crestor  9.  Previous NPH workup/cognitive impairment.has bilateral periventricular white matter chronic infarcts   Followed outpatient by neurology service Dr. Vickey Huger.  B12, TSH, ESR, RPR normal. -Continue f/u with Dr. Vickey Huger and [facility] neurology as outpatient -Uses Wheelchair/Scooter at home- chronic balance issue  10.  Left CCA stenosis.  CTA head and neck 60% stenosis distal left CCA secondary to plaque with ulceration. -Outpatient f/u with vascular surgery  11.   Hypertension- ? Diagnosis . BPs normal except for occ elevation , will monitor .  Patient on no antihypertensive medications prior to admission. Vitals:   05/28/23 1955 05/29/23 0356  BP: 118/77 133/78  Pulse: 64 61  Resp: 16 17  Temp: 97.9 F (36.6 C) 98.3 F (36.8 C)  SpO2: 97% 94%     12.  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  Follow-up outpatient. Consider Qutenza. Topamax started at night  13. Leukocytosis: due to UTI, resolved     Latest Ref Rng & Units 05/26/2023    5:21 AM 05/23/2023    6:47 AM 05/22/2023    6:18  AM  CBC  WBC 4.0 - 10.5 K/uL 7.2  10.5  19.3   Hemoglobin 13.0 - 17.0 g/dL 16.1  09.6  04.5   Hematocrit 39.0 - 52.0 % 41.3  40.6  40.8   Platelets 150 - 400 K/uL 316  238  257      14. Headache:Resolved topamax started at night and prn Fioricet ordered for the day   15.  UTI pansensitive e coli , will enc hydration, cont keflex x 5 d LOS: 8 days A FACE TO FACE EVALUATION WAS PERFORMED  Erick Colace 05/29/2023, 8:04 AM

## 2023-05-29 NOTE — Progress Notes (Signed)
Occupational Therapy Session Note  Patient Details  Name: Jared [name]. MRN: [id number] Date of Birth: 1941/03/07  Today's Date: 05/29/2023 OT Individual Time: 0851-0933+1303-1353 ( 50 mins) OT Individual Time Calculation (min): 42 min    Short Term Goals: Week 1:  OT Short Term Goal 1 (Week 1): pt will be able to don his pants with supervision. OT Short Term Goal 2 (Week 1): Pt will be able to don his socks with supervision using sock aid. OT Short Term Goal 3 (Week 1): pt will complete toilet transfers with CGA. OT Short Term Goal 4 (Week 1): pt will complete shower transfers with CGA.   Skilled Therapeutic Interventions/Progress Updates:  Session 1:   Pt greeted supine in bed, pt agreeable to OT intervention.      Transfers/bed mobility/functional mobility: pt required MIN A to elevate trunk into sitting, MIN verbal cues to problem solve hand placement for supine>sit. Stand pivot to w/c with RW and MINA for Rw mgmt and MIN verbal cues to sequence pivotal steps.   Therapeutic activity: pt completed reciprocal stepping task to beat of metronome to facilitate improved cadence and stride length for ADL transfers. Pt completed task at 45 bpm with pt completed task with BUE support from Rw and CGA. Graded task up and had pt ambulate to same beat however pt with more difficulty carrying over beat of metronome to functional ambulation.    ADLs:  Grooming: pt completed seated oral care at sink  MODI UB dressing:donned OH shirt with set- up assist.  LB dressing: donned brief with MAX A d/t FMC required to manipulate complex latches, however pt able to don pants with MINA needing assist to thread over feet but then able to pull to waist line in standing.  Footwear: pt able to don slip on sneakers with set- up assist.   Transfers: pt completed stand pivot to [facility] over toilet from w/c with use of grab bars and MINA, MIN verbal cues needed for hand/feet placement during transfer Toileting:  supervision for 3/3 toileting tasks with continent b/b void.    Ended session with pt supine in bed with all needs within reach and bed alarm activated.                   Session 2: Pt greeted supine in bed with son and wife present for session, pt agreeable to OT intervention.      family education provided on the below topics: -always using gait belt for functional mobility -recommendation on use of RW for functional ambulation - general assist needed for ADLS and functional mobility ( I.e CGA- supervision) -general education provided on impaired motor planning during transitions, allowing increased time for transitions and providing multimodal cues for sequencing transitions with RW -decreasing clutter/fall hazards in the home - discussed DME needs -education and visual demo provided on shower transfers to walkin shower, ultimately recommend wife assist pt into shower via stand pivot with use of grab bars and then pull shower seat up behind pt as pt with great difficulty motor planning stepping around to shower seat with use of grab bars.  -recommended multiple "dry runs" of shower transfer with HH assist.   Family demonstrated appropriate level of assist with ADLs and functional mobility    Ended session with pt handed off directly to PTA for next session.             Therapy Documentation Precautions:  Precautions Precautions: Other (comment), Fall Precaution Comments: lack of  wt shift w/ shuffled gait and anterior lean; very delayed processing for all tasks Restrictions Weight Bearing Restrictions Per Provider Order: No   Pain: No pain reported during session    Therapy/Group: Individual Therapy  Barron Schmid 05/29/2023, 9:53 AM

## 2023-05-29 NOTE — Progress Notes (Signed)
Patient ID: Jared [name]., male   DOB: 27-Sep-1940, 83 y.o.   MRN: [id number]  SW sent [facility] referral to [facility] for HHPT/OT/aide and waiting for follow-up.   *SW received updates pt is contracted with [facility]. SW will follow-up with pt wife.   SW left message for pt wife to confirm above. SW waiting on updates from [facility] if this case is open.   Cecile Sheerer, MSW, LCSW Office: [phone] Cell: [phone] Fax: [phone]

## 2023-05-29 NOTE — Progress Notes (Signed)
Speech Language Pathology Weekly Progress and Session Note  Patient Details  Name: Jared [name]. MRN: [id number] Date of Birth: November 29, 1940  Beginning of progress report period: May 22, 2023 End of progress report period: May 29, 2023  Today's Date: 05/29/2023 SLP Individual Time: 1440-1500 SLP Individual Time Calculation (min): 20 min  Short Term Goals: Week 1: SLP Short Term Goal 1 (Week 1): Patient will demonstrate orientation to time, place, location and self with mod multimodal A SLP Short Term Goal 1 - Progress (Week 1): Met SLP Short Term Goal 2 (Week 1): Patient will demonstrate basic problem solving abilities in functional daily situations given mod multimodal A SLP Short Term Goal 2 - Progress (Week 1): Met SLP Short Term Goal 3 (Week 1): Patient will recall biographical information with mod multimodal A SLP Short Term Goal 3 - Progress (Week 1): Met SLP Short Term Goal 4 (Week 1): Patient will demonstrate awareness of cognitive and physical changes with max multimodal A SLP Short Term Goal 4 - Progress (Week 1): Met    New Short Term Goals: Week 2: SLP Short Term Goal 1 (Week 2): STGs=LTGs d/t ELOS  Weekly Progress Updates: Patient has made excellent progress during therapy stay, meeting 4/4 short term goals set this reporting period. Patient is at overall mod assist level for memory of basic biographic information, basic environmental problem solving, awareness of cognitive and physical deficits, and supervision for orientation to time. Patient and family education ongoing. Patient will continue to benefit from skilled therapy services during remainder of CIR stay.     Intensity: Minumum of 1-2 x/day, 30 to 90 minutes Frequency: 3 to 5 out of 7 days Duration/Length of Stay: 1/28 Treatment/Interventions: Cognitive remediation/compensation;Cueing hierarchy;Environmental controls;Functional tasks;Internal/external aids;Patient/family education;Therapeutic  Activities;Therapeutic Exercise   Daily Session  Skilled Therapeutic Interventions: SLP conducted skilled therapy session targeting cognitive retraining goals and family education. Patient unavailable for first 10 minutes of scheduled session due to finishing with PT family education, thus session began 10 minutes late. SLP and family discussed patient's current cognitive abilities, external aids utilized to promote cognitive improvements and guided family throughout formatting and utilizing memory book. Patient oriented to date with supervision, then recalled daily events with max assist overall. Patient utilized daily schedule and exhibited functional environmental problem solving abilities given mod assist. Patient also demonstrated emerging awareness of cognitive deficits given mod assist overall. Patient was left in lowered bed with call bell in reach and bed alarm set. SLP will continue to target goals per plan of care.      Pain Pain Assessment Pain Scale: 0-10 Pain Score: 0-No pain  Therapy/Group: Individual Therapy  Jeannie Done, M.A., CCC-SLP  Yetta Barre 05/29/2023, 4:33 PM

## 2023-05-30 DIAGNOSIS — I639 Cerebral infarction, unspecified: Secondary | ICD-10-CM | POA: Diagnosis not present

## 2023-05-30 DIAGNOSIS — N3000 Acute cystitis without hematuria: Secondary | ICD-10-CM | POA: Diagnosis not present

## 2023-05-30 DIAGNOSIS — G912 (Idiopathic) normal pressure hydrocephalus: Secondary | ICD-10-CM | POA: Diagnosis not present

## 2023-05-30 NOTE — Progress Notes (Signed)
Occupational Therapy Weekly Progress Note  Patient Details  Name: Jared [name]. MRN: [id number] Date of Birth: 17-Jul-1940  Beginning of progress report period: May 22, 2023 End of progress report period: May 30, 2023  Today's Date: 05/30/2023 OT Individual Time: 0920-1025 OT Individual Time Calculation (min): 65 min    Patient has met 4 of 4 short term goals.  Pt has made excellent progress with his self care,  improved processing skills to complete tasks in a more timely manner.  He continues to need CGA with ADL transfers due to R inattention to RLE, difficulty with perceptual skills to turn body for transfers.  Continuing to focus on his balance and mobility goals to increase independence for home.   Patient continues to demonstrate the following deficits: unbalanced muscle activation, motor apraxia, and decreased coordination, decreased attention to right, decreased awareness, decreased problem solving, decreased safety awareness, and delayed processing, and decreased standing balance, hemiplegia, and decreased balance strategies and therefore will continue to benefit from skilled OT intervention to enhance overall performance with BADL.  Patient progressing toward long term goals..  Continue plan of care.  OT Short Term Goals Week 1:  OT Short Term Goal 1 (Week 1): pt will be able to don his pants with supervision. OT Short Term Goal 1 - Progress (Week 1): Met OT Short Term Goal 2 (Week 1): Pt will be able to don his socks with supervision using sock aid. OT Short Term Goal 2 - Progress (Week 1): Met OT Short Term Goal 3 (Week 1): pt will complete toilet transfers with CGA. OT Short Term Goal 3 - Progress (Week 1): Met OT Short Term Goal 4 (Week 1): pt will complete shower transfers with CGA. OT Short Term Goal 4 - Progress (Week 1): Met Week 2:  OT Short Term Goal 1 (Week 2): STGs = LTGs  Skilled Therapeutic Interventions/Progress Updates:      Pt seen for BADL  retraining of toileting, bathing, and dressing with a focus on safe mobility.  Pt ambulated from bed to toilet with RW with cues to fully advance R foot for step through.  To transfer to shower bench, mod cues and CGA with RW to turn body around and step back to bench.  To return to toilet after shower had pt use grab bar along wall only to simulate how he does a bathroom transfer at home.  He needs CGA and cues to not bring shoulders to far forward as he is trying to step.   Improved standing balance with managing clothing over hips.  See ADL documentation below. Pt did well with sock aid today, donning socks on aide himself before using it on his feet.   Pt resting in wc with belt alarm on sitting at sink to finish grooming tasks.    Therapy Documentation Precautions:  Precautions Precautions: Other (comment), Fall Precaution Comments: lack of wt shift w/ shuffled gait and anterior lean; very delayed processing for all tasks Restrictions Weight Bearing Restrictions Per Provider Order: No     Pain: Pain Assessment Pain Scale: 0-10 Pain Score: 0-No pain ADL: ADL Eating: Set up Where Assessed-Eating: Chair Grooming: Supervision/safety Where Assessed-Grooming: Standing at sink Upper Body Bathing: Supervision/safety Where Assessed-Upper Body Bathing: Shower Lower Body Bathing: Supervision/safety Where Assessed-Lower Body Bathing: Shower Upper Body Dressing: Setup Where Assessed-Upper Body Dressing: Chair Lower Body Dressing: Supervision/safety Where Assessed-Lower Body Dressing: Sitting at sink, Standing at sink Toileting: Supervision/safety Where Assessed-Toileting: Teacher, adult education: Furniture conservator/restorer  Method: Ambulating Acupuncturist: Grab bars, Raised toilet seat Tub/Shower Transfer: Other (comment) (n/a) Walk-In Shower Transfer: Administrator, arts Method: Designer, industrial/product: Emergency planning/management officer, Grab bars ADL  Comments: Patient maintains one hand at all times in support in sitting and standing      Therapy/Group: Individual Therapy  Lanai Conlee 05/30/2023, 10:59 AM

## 2023-05-30 NOTE — Progress Notes (Signed)
Patient ID: Jared [name]., male   DOB: 11/05/1940, 83 y.o.   MRN: [id number]  1245- SW returned phone call to pt wife who had a question about if a specialty w/c eval could occur now [facility] vent [facility]. Also confirmed [facility]- [facility].  Through discussion she reports that she thought her husband was discharged from [facility] and had no follow-up. She decided she would like another HHA. She is aware [facility] will now be [facility]. She is aware DME may not arrive to home by discharge date.   SW updated [facility] and [facility] on above changes.   SW sent urgent items DME request to [place], and [facility].   *SW received updates from [place] SW reporting pt currently has aide services via [facility] [phone].   Cecile Sheerer, MSW, LCSW Office: [phone] Cell: [phone] Fax: [phone]

## 2023-05-30 NOTE — Progress Notes (Signed)
Occupational Therapy Session Note  Patient Details  Name: Jared [name]. MRN: [id number] Date of Birth: [birth year]  Today's Date: 05/30/2023 OT Individual Time: 1349-1500 OT Individual Time Calculation (min): 71 min    Short Term Goals: Week 2:  OT Short Term Goal 1 (Week 2): STGs = LTGs  Skilled Therapeutic Interventions/Progress Updates:  Pt greeted seated in w/c, pt agreeable to OT intervention.      Transfers/bed mobility/functional mobility: pt completed stand pivot transfers and functional ambulation with Rw with CGA- MINA, MOD verbal/visual cues to increase step length and cadence with visual cue of theraband placed on inside of RW.   Therapeutic activity: pt completed stepping task to melodic verbal cues to facilitate improved stride length/cadence during functional ambulation. Pt completed task with BUE support and CGA. Pts step length and motor planning did improve with melodic cues.   Pt completed 1 min of alternating steps onto 3 inch step with BUE support, provided  melodic cues to carryover improved coordination and cadence during higher level functional mobility tasks. Pt completed task with CGA.   Pt completed lateral stepping tasks in // bars to improve motor planning during shower transfer with pt instructed to step side to side to transport horseshoes from one side of bars to the other to challenge balance and coordination during lateral tasks. Pt completed task with MIN A but no LOB. Graded task up and had pt step over cones to simulate shower threshold and to challenge balance and BLE motor planning/coordination.    Exercises:  Pt completed below therex to improved BUE strength/endurance with 2 lb weighted ball:  X10 rainbows X10 chest presses X10 internal/external shoulder rotation with pt passing ball around back in standing x5 reps in each direction  X10 bicep curls   General: pt reports his head feels "heavy" assessed BP from w/c 112/84( 93) HR 62 bpm                  Ended session with pt supine in bed with all needs within reach and bed alarm activated.                    Therapy Documentation Precautions:  Precautions Precautions: Other (comment), Fall Precaution Comments: lack of wt shift w/ shuffled gait and anterior lean; very delayed processing for all tasks Restrictions Weight Bearing Restrictions Per Provider Order: No  Pain: head feels "heavy" rest breaks provided as needed.       Therapy/Group: Individual Therapy  [name] [facility] 05/30/2023, 3:06 PM

## 2023-05-30 NOTE — Progress Notes (Signed)
PROGRESS NOTE   Subjective/Complaints:  No issues overnite , no HA, bowel movements are daily , appetite is good Working on WC mobility   ROS: Patient denies CP, SOB, N/V/D   Objective:   No results found.  No results for input(s): "WBC", "HGB", "HCT", "PLT" in the last 72 hours.  No results for input(s): "NA", "K", "CL", "CO2", "GLUCOSE", "BUN", "CREATININE", "CALCIUM" in the last 72 hours.    Intake/Output Summary (Last 24 hours) at 05/30/2023 0821 Last data filed at 05/30/2023 0730 Gross per 24 hour  Intake 960 ml  Output 1225 ml  Net -265 ml        Physical Exam: Vital Signs Blood pressure 91/65, pulse 63, temperature 98.3 F (36.8 C), temperature source Oral, resp. rate 15, height 5\' 10"  (1.778 m), weight 75.9 kg, SpO2 95%.   General: No acute distress Mood and affect are appropriate Heart: Regular rate and rhythm no rubs murmurs or extra sounds Lungs: Clear to auscultation, breathing unlabored, no rales or wheezes Abdomen: Positive bowel sounds, soft nontender to palpation, nondistended Extremities: No clubbing, cyanosis, or edema Skin: No evidence of breakdown, no evidence of rash Neurologic: Cranial nerves II through XII intact, motor strength is 5/5 in bilateral deltoid, bicep, tricep, grip, hip flexor, knee extensors, ankle dorsiflexor and plantar flexor Sensory exam normal sensation to light touch  bilateral upper and lower extremities  Mild aphasia mainly word finding issues Musculoskeletal: Full range of motion in all 4 extremities. No joint swelling   Assessment/Plan: 1. Functional deficits which require 3+ hours per day of interdisciplinary therapy in a comprehensive inpatient rehab setting. Physiatrist is providing close team supervision and 24 hour management of active medical problems listed below. Physiatrist and rehab team continue to assess barriers to discharge/monitor patient progress  toward functional and medical goals  Care Tool:  Bathing    Body parts bathed by patient: Chest, Abdomen, Front perineal area, Buttocks, Right upper leg, Left upper leg, Face, Right arm, Left arm, Right lower leg, Left lower leg   Body parts bathed by helper: Right arm, Left arm, Left lower leg, Right lower leg     Bathing assist Assist Level: Supervision/Verbal cueing     Upper Body Dressing/Undressing Upper body dressing   What is the patient wearing?: Pull over shirt    Upper body assist Assist Level: Set up assist    Lower Body Dressing/Undressing Lower body dressing      What is the patient wearing?: Underwear/pull up, Pants     Lower body assist Assist for lower body dressing: Supervision/Verbal cueing     Toileting Toileting    Toileting assist Assist for toileting: Supervision/Verbal cueing     Transfers Chair/bed transfer  Transfers assist  Chair/bed transfer activity did not occur: Safety/medical concerns (unsafe to get up)  Chair/bed transfer assist level: Contact Guard/Touching assist     Locomotion Ambulation   Ambulation assist      Assist level: Moderate Assistance - Patient 50 - 74% Assistive device: Walker-rolling Max distance: 63ft   Walk 10 feet activity   Assist     Assist level: Moderate Assistance - Patient - 50 - 74% Assistive device: Walker-rolling  Walk 50 feet activity   Assist Walk 50 feet with 2 turns activity did not occur: Safety/medical concerns         Walk 150 feet activity   Assist Walk 150 feet activity did not occur: Safety/medical concerns         Walk 10 feet on uneven surface  activity   Assist Walk 10 feet on uneven surfaces activity did not occur: Safety/medical concerns         Wheelchair     Assist Is the patient using a wheelchair?: Yes (for transport at this time) Type of Wheelchair: Manual (used scooter at baseline)    Wheelchair assist level: Dependent - Patient 0%       Wheelchair 50 feet with 2 turns activity    Assist        Assist Level: Dependent - Patient 0%   Wheelchair 150 feet activity     Assist      Assist Level: Dependent - Patient 0%   Blood pressure 91/65, pulse 63, temperature 98.3 F (36.8 C), temperature source Oral, resp. rate 15, height 5\' 10"  (1.778 m), weight 75.9 kg, SpO2 95%.  Medical Problem List and Plan: 1. 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  Status post loop recorder placement 05/19/2023 Deficits of aphasia mainly relate to the Left parietal lesion              -patient may  shower             -ELOS/Goals:1/28 Sup PT/OT/SLP            -Continue CIR therapies including PT, OT, and SLP   Strength is good but balance is poor, had balance issues related to NPH prior to CVA 2.  Antithrombotics: -DVT/anticoagulation:  Pharmaceutical: Lovenox.  Venous Doppler studies negative             -antiplatelet therapy: Aspirin 81 mg daily and Plavix 75 mg day x 3 weeks then aspirin alone starting 06/05/23 3. Pain Management: Tylenol as needed 4. Mood/Behavior/Sleep: Provide emotional support             -antipsychotic agents: N/A 5. Neuropsych/cognition: This patient may be capable of making decisions on his own behalf. 6. Skin/Wound Care: Routine skin checks 7. Fluids/Electrolytes/Nutrition: encourage PO  -continue protein supp for low albumin 8.  Hyperlipidemia.  Continue Crestor  9.  Previous NPH workup/cognitive impairment.has bilateral periventricular white matter chronic infarcts   Followed outpatient by neurology service Dr. Vickey Huger.  B12, TSH, ESR, RPR normal. -Continue f/u with Dr. Vickey Huger and [facility] neurology as outpatient -Uses Wheelchair/Scooter at home- chronic balance issue  10.  Left CCA stenosis.  CTA head and neck 60% stenosis distal left CCA secondary to plaque with ulceration. -Outpatient f/u with  vascular surgery  11.  Hypertension- ? Diagnosis . BPs normal except for occ elevation , will monitor .  Patient on no antihypertensive medications prior to admission. Vitals:   05/29/23 1811 05/30/23 0527  BP: 126/77 91/65  Pulse: 64 63  Resp: 18 15  Temp: 98.3 F (36.8 C) 98.3 F (36.8 C)  SpO2: 96% 95%     12.  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  Follow-up outpatient. Consider Qutenza. Topamax started at night  13. Leukocytosis: due to UTI, resolved     Latest Ref Rng & Units 05/26/2023    5:21 AM 05/23/2023    6:47 AM 05/22/2023  6:18 AM  CBC  WBC 4.0 - 10.5 K/uL 7.2  10.5  19.3   Hemoglobin 13.0 - 17.0 g/dL 53.6  64.4  03.4   Hematocrit 39.0 - 52.0 % 41.3  40.6  40.8   Platelets 150 - 400 K/uL 316  238  257      14. Headache:Resolved topamax started at night and prn Fioricet ordered for the day  Not needing prn fioricet or acetaminophen, will hold topamax and monitor  15.  UTI pansensitive e coli , will enc hydration, cont keflex x 5 d LOS: 9 days A FACE TO FACE EVALUATION WAS PERFORMED  Erick Colace 05/30/2023, 8:21 AM

## 2023-05-30 NOTE — Progress Notes (Signed)
Physical Therapy Weekly Progress Note  Patient Details  Name: Axyl Sitzman. MRN: [id number] Date of Birth: [birth year]  Beginning of progress report period: May 22, 2023 End of progress report period: May 30, 2023  Today's Date: 05/30/2023 PT Individual Time: 4098-1191 PT Individual Time Calculation (min): 64 min   Patient has met {number 1-5:22450} of {number 1-5:20334} short term goals.  ***  Patient continues to demonstrate the following deficits {impairments:3041632} and therefore will continue to benefit from skilled PT intervention to increase functional independence with mobility.  Patient {LTG progression:3041653}.  {plan of YNWG:9562130}  PT Short Term Goals {QMV:7846962}  Skilled Therapeutic Interventions/Progress Updates:      Therapy Documentation Precautions:  Precautions Precautions: Other (comment), Fall Precaution Comments: lack of wt shift w/ shuffled gait and anterior lean; very delayed processing for all tasks Restrictions Weight Bearing Restrictions Per Provider Order: No  Pain: Pain Assessment Pain Scale: 0-10 Pain Score: 0-No pain Vision/Perception     Mobility:   Locomotion :    Trunk/Postural Assessment :    Balance:   Exercises:   Other Treatments:     Therapy/Group: {Therapy/Group:3049007}  Loel Dubonnet 05/30/2023, 6:06 PM

## 2023-05-31 DIAGNOSIS — I639 Cerebral infarction, unspecified: Secondary | ICD-10-CM | POA: Diagnosis not present

## 2023-05-31 NOTE — Progress Notes (Signed)
Occupational Therapy Session Note  Patient Details  Name: Jared [name]. MRN: [id number] Date of Birth: 1940/07/10  Today's Date: 05/31/2023 OT Individual Time: 9629-5284 OT Individual Time Calculation (min): 39 min    Short Term Goals: Week 2:  OT Short Term Goal 1 (Week 2): STGs = LTGs  Skilled Therapeutic Interventions/Progress Updates:     Pt received reclined in bed presenting to be in good spirits receptive to skilled OT session reporting 0/10 pain- OT offering intermittent rest breaks, repositioning, and therapeutic support to optimize participation in therapy session. Focus this session R NMRE, functional mobility training, BADL retraining, and dynamic balance.   BADLs: Pt donned clean pants sitting EOB, leaning forwards towards ground to weave B LEs and standing using RW to bring pants to waist CGA. Pt stood at sink to brush hair and complete oral care with close supervision-CGA provided for safety- no LOB noted, Pt able to manipulate small toiletry items without dropping.   Mobility: Pt transitioned to EOB using bed rail with supervision +increased time. Sit<>stands CGA using RW throughout session with good recall of correct hand placement. Pt completed functional mobility to therapy gym using RW with CGA- mod consistent verbal and visual cues required for step length, trunk positioning, and R foot placement using wheel of RW and kicking therband as external visual cues. Pt presenting with forward trunk flexion, freezing during gait, and decreased stepping strategies during functional mobility.   Therapeutic Activities: Standing on compliant surface at elevated table top using RW for balance, engaged Pt in completing [facility] picture replication activity using small pegs. Pt able to complete task with 100% accuracy with no dropping noted and CGA provided for balance.   NMRE:  Pt completed R UE NMRE activity with focus on maintained grasp and VMC. Pt utilized hand strenghter with silver  spring on level 3 setting to pick up blocks and transport them to cones positioned on table top anterior to Pt. Pt able to complete 2x12 trials with short rest break provided between trials. Mod dropping fading to min dropping noted during activity d/t decreased hand strength and VMC deficit.  Pt was left resting in wc with call bell in reach, seatbelt alarm on, and all needs met.    Therapy Documentation Precautions:  Precautions Precautions: Other (comment), Fall Precaution Comments: lack of wt shift w/ shuffled gait and anterior lean; very delayed processing for all tasks Restrictions Weight Bearing Restrictions Per Provider Order: No   Therapy/Group: Individual Therapy  Clide Deutscher 05/31/2023, 8:00 AM

## 2023-05-31 NOTE — Progress Notes (Signed)
Physical Therapy Session Note  Patient Details  Name: Jared [name]. MRN: [id number] Date of Birth: 1941-03-19  Today's Date: 05/31/2023 PT Individual Time: 1006-1119 PT Individual Time Calculation (min): 73 min   Short Term Goals: Week 1:  PT Short Term Goal 1 (Week 1): Pt will perform supine<>sit with min A PT Short Term Goal 2 (Week 1): Pt will perform sit<>stands using LRAD with CGA PT Short Term Goal 3 (Week 1): Pt will consistently perform bed<>chair transfers using LRAD with min A PT Short Term Goal 4 (Week 1): Pt will ambulate at least 18ft using LRAD with min A PT Short Term Goal 5 (Week 1): Pt will participate in assessment of the need for a custom wheelchair to allow him increased independence with functional mobility in the home  Skilled Therapeutic Interventions/Progress Updates:    Pt received sitting in regular w/c awake and agreeable to therapy session.  Sit>stand w/c>RW with CGA - pt recalls need to push-up from w/c armrests without cuing. Short distance ~80ft ambulatory transfer to [facility] using RW with CGA and pt continuing to demo poor R foot clearance and step length - benefits from verbal and visual cuing for increased step length, requires cuing to bring AD back close to him when turning to sit.  Discussed that per primary PT, pt will have to get PWC through [place].   Therapist cuing to put on PWC seat belt prior to turning it on.  Pt turns on PWC and requires min cuing to ensure on correct "mode" as pt forgets how to change modes, but does recall how to adjust speed without cuing.   Performs PWC mobility to main therapy gym with therapist providing several barriers in the hallway for pt to navigate around - continues to have difficulty understanding the back of the w/c sticking out when turning and occasionally bumps it onto things. Performs with supervision and at end when turning to "back in" to park the w/c pt bumps back wheels into multiple items. Still believe pt may  benefit from trialing a midwheel drive PWC.    Gait training ~11ft using RW with CGA for safety . Pt demonstrating the following gait deviations with therapist providing the described cuing and facilitation for improvement:  - does well with reciprocal stepping pattern, but primarily toe strike on initial contact (R primarily) - excessive anterior trunk lean with incresaed WBing through AD and weightbearing through his toes  Donned maxi-sky harness and participated in the following dynamic gait training challenges with goal of decreased B UE support:  - pt continues to demo excessive anterior weight shift/lean in standing with pt's weight through his toes  - started with goal of forward gait using reciprocal stepping pattern with therapist providing B UE support in front of pt and providing visual targets to increase step length (primarily R LE) and then progressed to only R UE support and no UE support down/back (pt fearful of not using UE support and requires therapist to stay in front of him to feel safe) - added agility ladder to provide consistent visual target to increase step length; however, at first he had a harder time with it, but when therapist providing verbal/visual cuing for which ladder reign to step to it improved - requires therapist guarding in front and providing B UE support for balance  - pt continues to maintain wider BOS with lack of full weight shift over onto stance limbs and fairly consistent anterior lean - forward/backwards stepping over hockey stick  to external targets to be a visual target for increased step length leading with R LE in both directions - requires min/mod A for balance because when pt attempts to take larger step with R LE forward over stick, he has anterior LOB - x8reps - with repetition pt takes larger steps although still has increased time swaying back and forth in double limb support fearful to take a step -- therapist providing verbal encouragement on  his safety and to take larger steps using the harness  Pt describes use of the maxi-sky harness as: "Fun" "interesting" "challenging" and "exhausting"  Gait training ~77ft using RW back to [facility] with therapist providing min manual facilitation along with repeated cuing to maintain upright trunk posture with upright gaze to prevent anterior lean onto AD, and repeated cuing for "big" step with R LE. Requires ~3-28minutes to walk this distance requiring ~3x standing brakes to "reset" upright posture.   Transported back to room in [facility] and pt reports needing to toilet - left in care of nursing students.     Therapy Documentation Precautions:  Precautions Precautions: Other (comment), Fall Precaution Comments: lack of wt shift w/ shuffled gait and anterior lean; very delayed processing for all tasks Restrictions Weight Bearing Restrictions Per Provider Order: No   Pain:  No reports of pain throughout session.    Therapy/Group: Individual Therapy  Ginny Forth , PT, DPT, NCS, CSRS 05/31/2023, 7:57 AM

## 2023-05-31 NOTE — Progress Notes (Signed)
PROGRESS NOTE   Subjective/Complaints:  Pt reports no issues LBM yesterday, but also feels like could go again- not urgent.  Denies pain.    ROS:   Pt denies SOB, abd pain, CP, N/V/C/D, and vision changes   Objective:   No results found.  No results for input(s): "WBC", "HGB", "HCT", "PLT" in the last 72 hours.  No results for input(s): "NA", "K", "CL", "CO2", "GLUCOSE", "BUN", "CREATININE", "CALCIUM" in the last 72 hours.    Intake/Output Summary (Last 24 hours) at 05/31/2023 1254 Last data filed at 05/31/2023 0743 Gross per 24 hour  Intake 838 ml  Output 820 ml  Net 18 ml        Physical Exam: Vital Signs Blood pressure 128/80, pulse 63, temperature 98.7 F (37.1 C), resp. rate 17, height 5\' 10"  (1.778 m), weight 75.9 kg, SpO2 96%.     General: awake, alert, appropriate,sitting up in w/c at bedside;  NAD HENT: conjugate gaze; oropharynx moist CV: regular rate and rhythm; no JVD Pulmonary: CTA B/L; no W/R/R- good air movement GI: soft, NT, ND, (+)BS Psychiatric: appropriate- slightly delayed on responses Neurological: alert Skin: No evidence of breakdown, no evidence of rash Neurologic: Cranial nerves II through XII intact, motor strength is 5/5 in bilateral deltoid, bicep, tricep, grip, hip flexor, knee extensors, ankle dorsiflexor and plantar flexor Sensory exam normal sensation to light touch  bilateral upper and lower extremities  Mild aphasia mainly word finding issues Musculoskeletal: Full range of motion in all 4 extremities. No joint swelling   Assessment/Plan: 1. Functional deficits which require 3+ hours per day of interdisciplinary therapy in a comprehensive inpatient rehab setting. Physiatrist is providing close team supervision and 24 hour management of active medical problems listed below. Physiatrist and rehab team continue to assess barriers to discharge/monitor patient progress toward  functional and medical goals  Care Tool:  Bathing    Body parts bathed by patient: Chest, Abdomen, Front perineal area, Buttocks, Right upper leg, Left upper leg, Face, Right arm, Left arm, Right lower leg, Left lower leg   Body parts bathed by helper: Right arm, Left arm, Left lower leg, Right lower leg     Bathing assist Assist Level: Supervision/Verbal cueing     Upper Body Dressing/Undressing Upper body dressing   What is the patient wearing?: Pull over shirt    Upper body assist Assist Level: Set up assist    Lower Body Dressing/Undressing Lower body dressing      What is the patient wearing?: Underwear/pull up, Pants     Lower body assist Assist for lower body dressing: Supervision/Verbal cueing     Toileting Toileting    Toileting assist Assist for toileting: Supervision/Verbal cueing     Transfers Chair/bed transfer  Transfers assist  Chair/bed transfer activity did not occur: Safety/medical concerns (unsafe to get up)  Chair/bed transfer assist level: Contact Guard/Touching assist     Locomotion Ambulation   Ambulation assist      Assist level: Moderate Assistance - Patient 50 - 74% Assistive device: Walker-rolling Max distance: 40ft   Walk 10 feet activity   Assist     Assist level: Moderate Assistance - Patient - 22 -  74% Assistive device: Walker-rolling   Walk 50 feet activity   Assist Walk 50 feet with 2 turns activity did not occur: Safety/medical concerns         Walk 150 feet activity   Assist Walk 150 feet activity did not occur: Safety/medical concerns         Walk 10 feet on uneven surface  activity   Assist Walk 10 feet on uneven surfaces activity did not occur: Safety/medical concerns         Wheelchair     Assist Is the patient using a wheelchair?: Yes (for transport at this time) Type of Wheelchair: Manual (used scooter at baseline)    Wheelchair assist level: Dependent - Patient 0%       Wheelchair 50 feet with 2 turns activity    Assist        Assist Level: Dependent - Patient 0%   Wheelchair 150 feet activity     Assist      Assist Level: Dependent - Patient 0%   Blood pressure 128/80, pulse 63, temperature 98.7 F (37.1 C), resp. rate 17, height 5\' 10"  (1.778 m), weight 75.9 kg, SpO2 96%.  Medical Problem List and Plan: 1. 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  Status post loop recorder placement 05/19/2023 Deficits of aphasia mainly relate to the Left parietal lesion              -patient may  shower             -ELOS/Goals:1/28 Sup PT/OT/SLP           Con't CIR PT, OT and SLP Strength is good but balance is poor, had balance issues related to NPH prior to CVA 2.  Antithrombotics: -DVT/anticoagulation:  Pharmaceutical: Lovenox.  Venous Doppler studies negative             -antiplatelet therapy: Aspirin 81 mg daily and Plavix 75 mg day x 3 weeks then aspirin alone starting 06/05/23 3. Pain Management: Tylenol as needed  1/25- denies pain- con't tylenol prn 4. Mood/Behavior/Sleep: Provide emotional support             -antipsychotic agents: N/A 5. Neuropsych/cognition: This patient may be capable of making decisions on his own behalf. 6. Skin/Wound Care: Routine skin checks 7. Fluids/Electrolytes/Nutrition: encourage PO  -continue protein supp for low albumin 8.  Hyperlipidemia.  Continue Crestor  9.  Previous NPH workup/cognitive impairment.has bilateral periventricular white matter chronic infarcts   Followed outpatient by neurology service Dr. Vickey Huger.  B12, TSH, ESR, RPR normal. -Continue f/u with Dr. Vickey Huger and [facility] neurology as outpatient -Uses Wheelchair/Scooter at home- chronic balance issue  10.  Left CCA stenosis.  CTA head and neck 60% stenosis distal left CCA secondary to plaque with ulceration. -Outpatient f/u with vascular  surgery  11.  Hypertension- ? Diagnosis . BPs normal except for occ elevation , will monitor .  Patient on no antihypertensive medications prior to admission. Vitals:   05/30/23 2024 05/31/23 0557  BP: 129/85 128/80  Pulse: 61 63  Resp: 17 17  Temp: 97.7 F (36.5 C) 98.7 F (37.1 C)  SpO2: 95% 96%    1/25- BP controlled- con't to monitor  12.  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  Follow-up outpatient. Consider Qutenza. Topamax started at night  13. Leukocytosis: due to UTI, resolved     Latest Ref Rng & Units 05/26/2023  5:21 AM 05/23/2023    6:47 AM 05/22/2023    6:18 AM  CBC  WBC 4.0 - 10.5 K/uL 7.2  10.5  19.3   Hemoglobin 13.0 - 17.0 g/dL 16.1  09.6  04.5   Hematocrit 39.0 - 52.0 % 41.3  40.6  40.8   Platelets 150 - 400 K/uL 316  238  257      14. Headache:Resolved topamax started at night and prn Fioricet ordered for the day  Not needing prn fioricet or acetaminophen, will hold topamax and monitor  15.  UTI pansensitive e coli , will enc hydration, cont keflex x 5 d   LOS: 10 days A FACE TO FACE EVALUATION WAS PERFORMED  [employer] Console 05/31/2023, 12:54 PM

## 2023-05-31 NOTE — Progress Notes (Addendum)
Speech Language Pathology Daily Session Note  Patient Details  Name: Jared [name]. MRN: [id number] Date of Birth: 1941-04-09  Today's Date: 05/31/2023 SLP Individual Time: 0100-0200 SLP Individual Time Calculation (min): 60 min  Short Term Goals: Week 2: SLP Short Term Goal 1 (Week 2): STGs=LTGs d/t ELOS  Skilled Therapeutic Interventions:  Pt seen for ST targeting cognitive linguistic goals. Pt agreeable to session, reported no pain.Pt recalled 0% of details from yesterday's therapy sessions; improved to 100% with use of memory log when provided min A. Pt recalled additional details that were not written down after using the memory log to orient himself. Pt stated he did not recall compensatory memory strategies. WRAP strategy was discussed and models were provided. Pt then used association and visualization to recall personal information with 100% acc when provided min A. Pt's wife arrived near end of session and had questions about ways to support pt remembering to use walker properly. SLP recommended use of external memory aid sign on the walker itself and also demonstrated use of spaced retrieval training to increase recall of safety information (stand close to the walker, take big steps). Pt recalled this information with 100% acc independently up to the end of session (5 minutes). Pt left in chair with chair alarm activated. call bell in reach, and family present. Continue ST POC.   Pain Pain Assessment Pain Scale: 0-10 Pain Score: 0-No pain  Therapy/Group: Individual Therapy  Alphonsus Sias 05/31/2023, 4:07 PM

## 2023-06-01 DIAGNOSIS — I639 Cerebral infarction, unspecified: Secondary | ICD-10-CM | POA: Diagnosis not present

## 2023-06-01 NOTE — Progress Notes (Signed)
PROGRESS NOTE   Subjective/Complaints:  Pt reports slept well LBM this AM Ready for d/c on 1/28 No issues  ROS:   Pt denies SOB, abd pain, CP, N/V/C/D, and vision changes   Objective:   No results found.  No results for input(s): "WBC", "HGB", "HCT", "PLT" in the last 72 hours.  No results for input(s): "NA", "K", "CL", "CO2", "GLUCOSE", "BUN", "CREATININE", "CALCIUM" in the last 72 hours.    Intake/Output Summary (Last 24 hours) at 06/01/2023 0953 Last data filed at 06/01/2023 0713 Gross per 24 hour  Intake 834 ml  Output 800 ml  Net 34 ml        Physical Exam: Vital Signs Blood pressure 108/76, pulse 62, temperature 98.8 F (37.1 C), resp. rate 17, height 5\' 10"  (1.778 m), weight 75.9 kg, SpO2 99%.      General: awake, alert, appropriate, sitting up in bed- NAD HENT: conjugate gaze; oropharynx moist CV: regular rate and rhythm; no JVD Pulmonary: CTA B/L; no W/R/R- good air movement GI: soft, NT, ND, (+)BS Psychiatric: appropriate- flat Neurological: alert- slightly delayed in responses still Skin: No evidence of breakdown, no evidence of rash Neurologic: Cranial nerves II through XII intact, motor strength is 5/5 in bilateral deltoid, bicep, tricep, grip, hip flexor, knee extensors, ankle dorsiflexor and plantar flexor Sensory exam normal sensation to light touch  bilateral upper and lower extremities  Mild aphasia mainly word finding issues Musculoskeletal: Full range of motion in all 4 extremities. No joint swelling   Assessment/Plan: 1. Functional deficits which require 3+ hours per day of interdisciplinary therapy in a comprehensive inpatient rehab setting. Physiatrist is providing close team supervision and 24 hour management of active medical problems listed below. Physiatrist and rehab team continue to assess barriers to discharge/monitor patient progress toward functional and medical  goals  Care Tool:  Bathing    Body parts bathed by patient: Chest, Abdomen, Front perineal area, Buttocks, Right upper leg, Left upper leg, Face, Right arm, Left arm, Right lower leg, Left lower leg   Body parts bathed by helper: Right arm, Left arm, Left lower leg, Right lower leg     Bathing assist Assist Level: Supervision/Verbal cueing     Upper Body Dressing/Undressing Upper body dressing   What is the patient wearing?: Pull over shirt    Upper body assist Assist Level: Set up assist    Lower Body Dressing/Undressing Lower body dressing      What is the patient wearing?: Underwear/pull up, Pants     Lower body assist Assist for lower body dressing: Supervision/Verbal cueing     Toileting Toileting    Toileting assist Assist for toileting: Supervision/Verbal cueing     Transfers Chair/bed transfer  Transfers assist  Chair/bed transfer activity did not occur: Safety/medical concerns (unsafe to get up)  Chair/bed transfer assist level: Contact Guard/Touching assist     Locomotion Ambulation   Ambulation assist      Assist level: Moderate Assistance - Patient 50 - 74% Assistive device: Walker-rolling Max distance: 30ft   Walk 10 feet activity   Assist     Assist level: Moderate Assistance - Patient - 50 - 74% Assistive device: Walker-rolling  Walk 50 feet activity   Assist Walk 50 feet with 2 turns activity did not occur: Safety/medical concerns         Walk 150 feet activity   Assist Walk 150 feet activity did not occur: Safety/medical concerns         Walk 10 feet on uneven surface  activity   Assist Walk 10 feet on uneven surfaces activity did not occur: Safety/medical concerns         Wheelchair     Assist Is the patient using a wheelchair?: Yes (for transport at this time) Type of Wheelchair: Manual (used scooter at baseline)    Wheelchair assist level: Dependent - Patient 0%      Wheelchair 50 feet with  2 turns activity    Assist        Assist Level: Dependent - Patient 0%   Wheelchair 150 feet activity     Assist      Assist Level: Dependent - Patient 0%   Blood pressure 108/76, pulse 62, temperature 98.8 F (37.1 C), resp. rate 17, height 5\' 10"  (1.778 m), weight 75.9 kg, SpO2 99%.  Medical Problem List and Plan: 1. 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  Status post loop recorder placement 05/19/2023 Deficits of aphasia mainly relate to the Left parietal lesion              -patient may  shower             -ELOS/Goals:1/28 Sup PT/OT/SLP           Con't CIR PT, OT and SLP Strength is good but balance is poor, had balance issues related to NPH prior to CVA 2.  Antithrombotics: -DVT/anticoagulation:  Pharmaceutical: Lovenox.  Venous Doppler studies negative             -antiplatelet therapy: Aspirin 81 mg daily and Plavix 75 mg day x 3 weeks then aspirin alone starting 06/05/23 3. Pain Management: Tylenol as needed  1/25- denies pain- con't tylenol prn 4. Mood/Behavior/Sleep: Provide emotional support             -antipsychotic agents: N/A 5. Neuropsych/cognition: This patient may be capable of making decisions on his own behalf. 6. Skin/Wound Care: Routine skin checks 7. Fluids/Electrolytes/Nutrition: encourage PO  -continue protein supp for low albumin 8.  Hyperlipidemia.  Continue Crestor  9.  Previous NPH workup/cognitive impairment.has bilateral periventricular white matter chronic infarcts   Followed outpatient by neurology service Dr. Vickey Huger.  B12, TSH, ESR, RPR normal. -Continue f/u with Dr. Vickey Huger and [facility] neurology as outpatient -Uses Wheelchair/Scooter at home- chronic balance issue  10.  Left CCA stenosis.  CTA head and neck 60% stenosis distal left CCA secondary to plaque with ulceration. -Outpatient f/u with vascular surgery  11.  Hypertension- ?  Diagnosis . BPs normal except for occ elevation , will monitor .  Patient on no antihypertensive medications prior to admission. Vitals:   05/31/23 1921 06/01/23 0430  BP: 137/79 108/76  Pulse: 66 62  Resp: 17 17  Temp: 98.7 F (37.1 C) 98.8 F (37.1 C)  SpO2: 95% 99%    1/25-1/26- BP controlled- con't to monitor  12.  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  Follow-up outpatient. Consider Qutenza. Topamax started at night  13. Leukocytosis: due to UTI, resolved     Latest Ref Rng & Units 05/26/2023    5:21 AM 05/23/2023  6:47 AM 05/22/2023    6:18 AM  CBC  WBC 4.0 - 10.5 K/uL 7.2  10.5  19.3   Hemoglobin 13.0 - 17.0 g/dL 62.9  52.8  41.3   Hematocrit 39.0 - 52.0 % 41.3  40.6  40.8   Platelets 150 - 400 K/uL 316  238  257      14. Headache:Resolved topamax started at night and prn Fioricet ordered for the day  Not needing prn fioricet or acetaminophen, will hold topamax and monitor   1/26- denies any HA's- pain 15.  UTI pansensitive e coli , will enc hydration, cont keflex x 5 d   LOS: 11 days A FACE TO FACE EVALUATION WAS PERFORMED  Joaquin Knebel 06/01/2023, 9:53 AM

## 2023-06-02 ENCOUNTER — Other Ambulatory Visit (HOSPITAL_COMMUNITY): Payer: Self-pay

## 2023-06-02 DIAGNOSIS — I639 Cerebral infarction, unspecified: Secondary | ICD-10-CM | POA: Diagnosis not present

## 2023-06-02 DIAGNOSIS — N3000 Acute cystitis without hematuria: Secondary | ICD-10-CM | POA: Diagnosis not present

## 2023-06-02 DIAGNOSIS — G912 (Idiopathic) normal pressure hydrocephalus: Secondary | ICD-10-CM | POA: Diagnosis not present

## 2023-06-02 MED ORDER — ROSUVASTATIN CALCIUM 20 MG PO TABS
20.0000 mg | ORAL_TABLET | Freq: Every day | ORAL | 0 refills | Status: DC
  Filled 2023-06-02: qty 30, 30d supply, fill #0

## 2023-06-02 MED ORDER — CLOPIDOGREL BISULFATE 75 MG PO TABS
75.0000 mg | ORAL_TABLET | Freq: Every day | ORAL | 0 refills | Status: AC
  Filled 2023-06-02: qty 1, 1d supply, fill #0

## 2023-06-02 NOTE — Progress Notes (Signed)
PROGRESS NOTE   Subjective/Complaints:  Pt aware of discharge . No c/o.  DIscussed d/c process   ROS:   Pt denies SOB, abd pain, CP, N/V/C/D, and vision changes   Objective:   No results found.  No results for input(s): "WBC", "HGB", "HCT", "PLT" in the last 72 hours.  No results for input(s): "NA", "K", "CL", "CO2", "GLUCOSE", "BUN", "CREATININE", "CALCIUM" in the last 72 hours.    Intake/Output Summary (Last 24 hours) at 06/02/2023 0826 Last data filed at 06/02/2023 0717 Gross per 24 hour  Intake 740 ml  Output 1550 ml  Net -810 ml        Physical Exam: Vital Signs Blood pressure 138/88, pulse 64, temperature (!) 97.3 F (36.3 C), resp. rate 18, height 5\' 10"  (1.778 m), weight 75.9 kg, SpO2 97%.   General: No acute distress Mood and affect are appropriate Heart: Regular rate and rhythm no rubs murmurs or extra sounds Lungs: Clear to auscultation, breathing unlabored, no rales or wheezes Abdomen: Positive bowel sounds, soft nontender to palpation, nondistended Extremities: No clubbing, cyanosis, or edema Skin: No evidence of breakdown, no evidence of rash   Neurologic: Cranial nerves II through XII intact, motor strength is 5/5 in bilateral deltoid, bicep, tricep, grip, hip flexor, knee extensors, ankle dorsiflexor and plantar flexor Sensory exam normal sensation to light touch  bilateral upper and lower extremities  Mild aphasia mainly word finding issues Musculoskeletal: Full range of motion in all 4 extremities. No joint swelling   Assessment/Plan: 1. Functional deficits which require 3+ hours per day of interdisciplinary therapy in a comprehensive inpatient rehab setting. Physiatrist is providing close team supervision and 24 hour management of active medical problems listed below. Physiatrist and rehab team continue to assess barriers to discharge/monitor patient progress toward functional and medical  goals  Care Tool:  Bathing    Body parts bathed by patient: Chest, Abdomen, Front perineal area, Buttocks, Right upper leg, Left upper leg, Face, Right arm, Left arm, Right lower leg, Left lower leg   Body parts bathed by helper: Right arm, Left arm, Left lower leg, Right lower leg     Bathing assist Assist Level: Supervision/Verbal cueing     Upper Body Dressing/Undressing Upper body dressing   What is the patient wearing?: Pull over shirt    Upper body assist Assist Level: Set up assist    Lower Body Dressing/Undressing Lower body dressing      What is the patient wearing?: Underwear/pull up, Pants     Lower body assist Assist for lower body dressing: Supervision/Verbal cueing     Toileting Toileting    Toileting assist Assist for toileting: Supervision/Verbal cueing     Transfers Chair/bed transfer  Transfers assist  Chair/bed transfer activity did not occur: Safety/medical concerns (unsafe to get up)  Chair/bed transfer assist level: Contact Guard/Touching assist     Locomotion Ambulation   Ambulation assist      Assist level: Moderate Assistance - Patient 50 - 74% Assistive device: Walker-rolling Max distance: 69ft   Walk 10 feet activity   Assist     Assist level: Moderate Assistance - Patient - 50 - 74% Assistive device: Walker-rolling  Walk 50 feet activity   Assist Walk 50 feet with 2 turns activity did not occur: Safety/medical concerns         Walk 150 feet activity   Assist Walk 150 feet activity did not occur: Safety/medical concerns         Walk 10 feet on uneven surface  activity   Assist Walk 10 feet on uneven surfaces activity did not occur: Safety/medical concerns         Wheelchair     Assist Is the patient using a wheelchair?: Yes (for transport at this time) Type of Wheelchair: Manual (used scooter at baseline)    Wheelchair assist level: Dependent - Patient 0%      Wheelchair 50 feet with  2 turns activity    Assist        Assist Level: Dependent - Patient 0%   Wheelchair 150 feet activity     Assist      Assist Level: Dependent - Patient 0%   Blood pressure 138/88, pulse 64, temperature (!) 97.3 F (36.3 C), resp. rate 18, height 5\' 10"  (1.778 m), weight 75.9 kg, SpO2 97%.  Medical Problem List and Plan: 1. 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  Status post loop recorder placement 05/19/2023 Deficits of aphasia mainly relate to the Left parietal lesion              -patient may  shower             -ELOS/Goals:1/28 Sup PT/OT/SLP           Con't CIR PT, OT and SLP  2.  Antithrombotics: -DVT/anticoagulation:  Pharmaceutical: Lovenox.  Venous Doppler studies negative             -antiplatelet therapy: Aspirin 81 mg daily and Plavix 75 mg day x 3 weeks then aspirin alone starting 06/05/23 3. Pain Management: Tylenol as needed  1/25- denies pain- con't tylenol prn 4. Mood/Behavior/Sleep: Provide emotional support             -antipsychotic agents: N/A 5. Neuropsych/cognition: This patient may be capable of making decisions on his own behalf. 6. Skin/Wound Care: Routine skin checks 7. Fluids/Electrolytes/Nutrition: encourage PO  -continue protein supp for low albumin 8.  Hyperlipidemia.  Continue Crestor  9.  Previous NPH workup/cognitive impairment.has bilateral periventricular white matter chronic infarcts   Followed outpatient by neurology service Dr. Vickey Huger.  B12, TSH, ESR, RPR normal. -Continue f/u with Dr. Vickey Huger and [facility] neurology as outpatient -Uses Wheelchair/Scooter at home- chronic balance issue  10.  Left CCA stenosis.  CTA head and neck 60% stenosis distal left CCA secondary to plaque with ulceration. -Outpatient f/u with vascular surgery  11.  Hypertension- ? Diagnosis . BPs normal except for occ elevation , will monitor .  Patient on no  antihypertensive medications prior to admission. Vitals:   06/01/23 2000 06/02/23 0553  BP: 104/65 138/88  Pulse: 70 64  Resp: 18 18  Temp: 97.7 F (36.5 C) (!) 97.3 F (36.3 C)  SpO2: 99% 97%    1/25-1/26- BP controlled- con't to monitor  12.  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  Follow-up outpatient. Consider Qutenza. Topamax started at night  13. Leukocytosis: due to UTI, resolved     Latest Ref Rng & Units 05/26/2023    5:21 AM 05/23/2023    6:47 AM 05/22/2023    6:18 AM  CBC  WBC  4.0 - 10.5 K/uL 7.2  10.5  19.3   Hemoglobin 13.0 - 17.0 g/dL 19.1  47.8  29.5   Hematocrit 39.0 - 52.0 % 41.3  40.6  40.8   Platelets 150 - 400 K/uL 316  238  257      14. Headache:Resolved topamax started at night and prn Fioricet ordered for the day  Not needing prn fioricet or acetaminophen, will hold topamax and monitor   1/26- denies any HA's- pain 15.  UTI pansensitive e coli , will enc hydration, cont keflex x 5 d   LOS: 12 days A FACE TO FACE EVALUATION WAS PERFORMED  Erick Colace 06/02/2023, 8:26 AM

## 2023-06-02 NOTE — Progress Notes (Signed)
Physical Therapy Session Note  Patient Details  Name: Jared [name]. MRN: [id number] Date of Birth: 02/25/1941  Today's Date: 06/02/2023 PT Individual Time: 4098-1191 PT Individual Time Calculation (min): 42 min   Short Term Goals: Week 1:  PT Short Term Goal 1 (Week 1): Pt will perform supine<>sit with min A PT Short Term Goal 1 - Progress (Week 1): Met PT Short Term Goal 2 (Week 1): Pt will perform sit<>stands using LRAD with CGA PT Short Term Goal 2 - Progress (Week 1): Met PT Short Term Goal 3 (Week 1): Pt will consistently perform bed<>chair transfers using LRAD with min A PT Short Term Goal 3 - Progress (Week 1): Met PT Short Term Goal 4 (Week 1): Pt will ambulate at least 28ft using LRAD with min A PT Short Term Goal 4 - Progress (Week 1): Met PT Short Term Goal 5 (Week 1): Pt will participate in assessment of the need for a custom wheelchair to allow him increased independence with functional mobility in the home PT Short Term Goal 5 - Progress (Week 1): Met  Skilled Therapeutic Interventions/Progress Updates:      Therapy Documentation Precautions:  Precautions Precautions: Fall Precaution Comments: needs cue to fully advance R foot in gait about 50% of the time Restrictions Weight Bearing Restrictions Per Provider Order: No General:   Pt received seated in manual w/c and agreeable to session focusing on PWC mobility and car transfer. Pt denies any pain.   Pt performed stand pivot transfer with RW with CGA for physical mobility and minA for v/c to increase step height, RW sequencing, and weight shift. Pt navigated PWC to ortho gym. Pt bumped into obstacle in ortho gym and was given v/c to decrease speed setting in more crowded environments and take hand off joy stick/power down chair when not actively moving.   Pt performed car transfer with RW and minA, with v/c to increase step height, RW sequencing, and sitting in car before moving legs in. Pt initially tried to  step into car, demonstrating poor safety awareness, but then corrected to sitting first with v/c. Pt displayed shuffling gait, difficulty with initiation, and required increased time.  Pt transferred back to [name] with minA and SBA for repositioning in chair. Utilized teach back method to discuss sequencing of car transfer and power [employer] and don'ts (don seat belt, hand off joystick/turn off chair when stationary). Discussed this is particularly important if his dog jumps in his lap at home.   Pt navigated PWC with supervision back to room traveling through therapy gym and dayroom, with obstacles in place and while performing cognitive dual task with recall of previous events, to simulate attention demand of community mobility in PWC. Pt required increased time for recall, but did not hit any obstacles. Pt performed stand pivot transfer back to bed with minA and was left supine in bed with all needs in reach and alarm on.     Therapy/Group: Individual Therapy  Collins Scotland 06/02/2023, 12:39 PM

## 2023-06-02 NOTE — Plan of Care (Signed)
  Problem: RH Balance Goal: LTG: Patient will maintain dynamic sitting balance (OT) Description: LTG:  Patient will maintain dynamic sitting balance with assistance during activities of daily living (OT) Outcome: Completed/Met Goal: LTG Patient will maintain dynamic standing with ADLs (OT) Description: LTG:  Patient will maintain dynamic standing balance with assist during activities of daily living (OT)  Outcome: Completed/Met   Problem: Sit to Stand Goal: LTG:  Patient will perform sit to stand in prep for activites of daily living with assistance level (OT) Description: LTG:  Patient will perform sit to stand in prep for activites of daily living with assistance level (OT) Outcome: Completed/Met   Problem: RH Dressing Goal: LTG Patient will perform upper body dressing (OT) Description: LTG Patient will perform upper body dressing with assist, with/without cues (OT). Outcome: Completed/Met Goal: LTG Patient will perform lower body dressing w/assist (OT) Description: LTG: Patient will perform lower body dressing with assist, with/without cues in positioning using equipment (OT) Outcome: Completed/Met   Problem: RH Toileting Goal: LTG Patient will perform toileting task (3/3 steps) with assistance level (OT) Description: LTG: Patient will perform toileting task (3/3 steps) with assistance level (OT)  Outcome: Completed/Met   Problem: RH Attention Goal: LTG Patient will demonstrate this level of attention during functional activites (OT) Description: LTG:  Patient will demonstrate this level of attention during functional activites  (OT) Outcome: Completed/Met   Problem: RH Awareness Goal: LTG: Patient will demonstrate awareness during functional activites type of (OT) Description: LTG: Patient will demonstrate awareness during functional activites type of (OT) Outcome: Completed/Met

## 2023-06-02 NOTE — Progress Notes (Signed)
Physical Therapy Session Note  Patient Details  Name: Jared [name]. MRN: [id number] Date of Birth: 02/21/1941  Today's Date: 06/02/2023 PT Individual Time: 1305-1420 PT Individual Time Calculation (min): 75 min   Short Term Goals: Week 1:  PT Short Term Goal 1 (Week 1): Pt will perform supine<>sit with min A PT Short Term Goal 1 - Progress (Week 1): Met PT Short Term Goal 2 (Week 1): Pt will perform sit<>stands using LRAD with CGA PT Short Term Goal 2 - Progress (Week 1): Met PT Short Term Goal 3 (Week 1): Pt will consistently perform bed<>chair transfers using LRAD with min A PT Short Term Goal 3 - Progress (Week 1): Met PT Short Term Goal 4 (Week 1): Pt will ambulate at least 4ft using LRAD with min A PT Short Term Goal 4 - Progress (Week 1): Met PT Short Term Goal 5 (Week 1): Pt will participate in assessment of the need for a custom wheelchair to allow him increased independence with functional mobility in the home PT Short Term Goal 5 - Progress (Week 1): Met Week 2:  PT Short Term Goal 1 (Week 2): STG = LTG d/t ELOS  Skilled Therapeutic Interventions/Progress Updates:  Patient supine in bed on entrance to room. Patient alert and agreeable to PT session.   Patient with no pain complaint at start of session.  Therapeutic Activity: Bed Mobility: Pt performed supine <> sit with Mod I from hospital bed. Taken to ADL apartment and able to get in/ out of bed with Mod I and no vc provided for technique or safety. Transfers: Pt performed sit<>stand and stand pivot transfers throughout session in differing environments and from variable seat heights with and without arm supports with overall Mod I/ supervision. Is also able to transfer in/ out of rocking recliner in ADL apartment with cue for scooting forward on seat prior to rise to stand with no issues in balance. Otherwise, no cueing provided for technique.  Requests to toilet and able to sit<>stand to RW to stand pivot to toilet.  Is able to manage clothing and use washcloth for pericare following toileting. Then ambulates to sink to wash hands, brush teeth, comb hair. No cueing required for sequencing  or initiating tasks.   Wheelchair Mobility:  Pt propelled standard wheelchair 170 feet with use of BUE initially and then instructed to use BLE only d/t pt with transport chair at home. Provided vc/ tc for increasing effort with RUE. Seat height is too high for pt and has difficulty with BLE only but is able to demonstrate good technique if he wanted to utilize transport chair to mobilize at home.   Continue to recommend power w/c evaluation and training d/t variable quality of gait with Parkinsonian presentation (freezing/ walking on toes/ forward lean into RW/ shuffle step).  Gait Training:  Pt ambulated up/ down 12' ramp in ortho gym using RW with close supervision Demonstrated need for vc for increased step height to ascend and then maintaining control of RW to decend. Good technique throughout with no physical assist required.   Ambulated to main gym using RW and with adequate step height/ length with intermittent cueing required to maintain. Reaches stairs for training and is able to use BUE to L HR only and initially ascends with reciprocal step pattern and descends with step-to pattern. Final 2 bouts of steps performed with step-to pattern and vc for placing entire foot onto step to prevent roll of ankle.   Patient seated up in  w/c at end of session with brakes locked, no alarm set as NT and trainee in room changing sheets on bed and obtaining vitals, and all needs within reach.   Therapy Documentation Precautions:  Precautions Precautions: Fall Precaution Comments: needs cue to fully advance R foot in gait about 50% of the time Restrictions Weight Bearing Restrictions Per Provider Order: No  Pain: Pain Assessment Pain Scale: 0-10 Pain Score: 0-No pain  Therapy/Group: Individual Therapy  Loel Dubonnet PT,  DPT, CSRS 06/02/2023, 6:16 PM

## 2023-06-02 NOTE — Progress Notes (Signed)
Inpatient Rehabilitation Discharge Medication Review by a Pharmacist  A complete drug regimen review was completed for this patient to identify any potential clinically significant medication issues.  High Risk Drug Classes Is patient taking? Indication by Medication  Antipsychotic No   Anticoagulant No   Antibiotic No   Opioid No   Antiplatelet Yes Aspirin / Plavix- CVA ppx  Hypoglycemics/insulin No   Vasoactive Medication No   Chemotherapy No   Other Yes Crestor- HLD      Type of Medication Issue Identified Description of Issue Recommendation(s)  Drug Interaction(s) (clinically significant)     Duplicate Therapy     Allergy     No Medication Administration End Date     Incorrect Dose     Additional Drug Therapy Needed     Significant med changes from prior encounter (inform family/care partners about these prior to discharge).    Other       Clinically significant medication issues were identified that warrant physician communication and completion of prescribed/recommended actions by midnight of the next day:  No   Time spent performing this drug regimen review (minutes):  30   Jaiona Simien BS, PharmD, BCPS Clinical Pharmacist 06/02/2023 6:40 AM  Contact: [phone] after 3 PM  "Be curious, not judgmental..." -Debbora Dus

## 2023-06-02 NOTE — Progress Notes (Signed)
Occupational Therapy Discharge Summary  Patient Details  Name: Jared [name]. MRN: [id number] Date of Birth: 1940/05/17  Date of Discharge from OT service:June 02, 2023    Patient has met 8 of 8 long term goals due to improved balance, postural control, ability to compensate for deficits, functional use of  RIGHT lower extremity, improved attention, improved awareness, and improved coordination.   Patient to discharge at overall Supervision level.  Patient's care partner is independent to provide the necessary physical and cognitive assistance at discharge.  Family education completed with his spouse.   Reasons goals not met: n/a  Recommendation:  Patient will benefit from ongoing skilled OT services in home health setting to continue to advance functional skills in the area of BADL.  Equipment: Shower chair and [facility]  Reasons for discharge: treatment goals met  Patient/family agrees with progress made and goals achieved: Yes  OT Discharge Precautions/Restrictions  Precautions Precautions: Fall Precaution Comments: needs cue to fully advance R foot in gait about 50% of the time Restrictions Weight Bearing Restrictions Per Provider Order: No   ADL ADL Eating: Independent Where Assessed-Eating: Chair Grooming: Supervision/safety Where Assessed-Grooming: Standing at sink Upper Body Bathing: Supervision/safety Where Assessed-Upper Body Bathing: Shower Lower Body Bathing: Supervision/safety Where Assessed-Lower Body Bathing: Shower Upper Body Dressing: Setup Where Assessed-Upper Body Dressing: Chair Lower Body Dressing: Supervision/safety Where Assessed-Lower Body Dressing: Chair Toileting: Supervision/safety Where Assessed-Toileting: Teacher, adult education: Close supervision Toilet Transfer Method: Proofreader: Grab bars, Raised toilet seat Tub/Shower Transfer: Other (comment) (n/a) Walk-In Shower Transfer: Close supervision Lexicographer Method: Designer, industrial/product: Emergency planning/management officer, Grab bars ADL Comments: . Vision Baseline Vision/History: 0 No visual deficits Patient Visual Report: No change from baseline Vision Assessment?: No apparent visual deficits Perception  Perception-Other Comments: needs cue on how to rotate body to prepare for sitting and how far to step back to seat surface Praxis Praxis: Impaired Praxis Impairment Details: Motor planning Praxis-Other Comments: min impaired with transfers, functional for bathing, dressing, toileting Cognition Cognition Overall Cognitive Status: Impaired/Different from baseline Orientation Level: Person;Place;Situation Person: Oriented Place: Oriented Situation: Oriented Memory: Impaired Memory Impairment: Retrieval deficit;Decreased long term memory;Decreased short term memory Focused Attention: Appears intact Sustained Attention: Appears intact Selective Attention: Appears intact Awareness: Impaired Awareness Impairment: Emergent impairment Problem Solving: Impaired Problem Solving Impairment: Functional complex Initiating: Appears intact Self Correcting: Appears intact Brief Interview for Mental Status (BIMS) Repetition of Three Words (First Attempt): 3 Temporal Orientation: Year: Correct Temporal Orientation: Month: Accurate within 5 days Temporal Orientation: Day: Correct Recall: "Sock": Yes, after cueing ("something to wear") Recall: "Blue": Yes, no cue required Recall: "Bed": Yes, no cue required BIMS Summary Score: 14 Sensation Sensation Light Touch: Appears Intact Hot/Cold: Appears Intact Proprioception: Impaired by gross assessment Stereognosis: Not tested Coordination Gross Motor Movements are Fluid and Coordinated: No Finger Nose Finger Test: WFL able to do 10x in 10 sec on B hands Motor  Motor Motor - Discharge Observations: impaired execution of motor tasks Mobility  Bed Mobility Rolling Right:  Independent Rolling Left: Independent Supine to Sit: Independent Sit to Supine: Independent Transfers Sit to Stand: Supervision/Verbal cueing Stand to Sit: Supervision/Verbal cueing  Trunk/Postural Assessment  Postural Control Protective Responses: decreased but improved from admission, pt actively reaches for support when needed  Balance Static Sitting Balance Static Sitting - Level of Assistance: 7: Independent Dynamic Sitting Balance Dynamic Sitting - Level of Assistance: 7: Independent Static Standing Balance Static Standing - Level of Assistance: 5: Stand  by assistance Dynamic Standing Balance Dynamic Standing - Level of Assistance: 5: Stand by assistance Extremity/Trunk Assessment RUE Assessment Active Range of Motion (AROM) Comments: Can raise arm overhead, has full elbow/forearm/hand motion General Strength Comments: 4+/5 LUE Assessment Active Range of Motion (AROM) Comments: Old injury to shoulder from fall - can raise arm to ~100*, has full elbow-->hand motion General Strength Comments: 4/5   Kyeshia Zinn 06/02/2023, 12:18 PM

## 2023-06-02 NOTE — Progress Notes (Signed)
Physical Therapy Discharge Summary  Patient Details  Name: Breon Rehm. MRN: [id number] Date of Birth: November 05, 1940  Date of Discharge from PT service:June 02, 2023  Today's Date: 06/02/2023 PT Individual Time: 1305-1420 PT Individual Time Calculation (min): 75 min    Patient has met {NUMBERS 0-12:18577} of {NUMBERS 0-12:18577} long term goals due to improved activity tolerance, improved balance, increased strength, functional use of  right upper extremity and right lower extremity, improved attention, improved awareness, and improved coordination.  Patient to discharge at an ambulatory level  CGA .   Patient's care partner is independent to provide the necessary physical and cognitive assistance at discharge.  Reasons goals not met: ***  Recommendation:  Patient will benefit from ongoing skilled PT services in {setting:3041680} to continue to advance safe functional mobility, address ongoing impairments in ***, and minimize fall risk.  Equipment: RW, [facility] from non-functional scooter at home to motorized w/c as pt not expected to progress with ambulation and will require better ability to mobilize in community.  Reasons for discharge: treatment goals met and discharge from hospital  Patient/family agrees with progress made and goals achieved: Yes  PT Discharge Precautions/Restrictions   Vital Signs Therapy Vitals Temp: (!) 97.5 F (36.4 C) Temp Source: Oral Pulse Rate: 75 Resp: 19 BP: 139/81 Patient Position (if appropriate): Sitting Oxygen Therapy SpO2: 97 % O2 Device: Room Air Pain Pain Assessment Pain Scale: 0-10 Pain Score: 0-No pain Pain Interference   Vision/Perception     Cognition   Sensation   Motor     Mobility   Locomotion     Trunk/Postural Assessment     Balance   Extremity Assessment            Loel Dubonnet 06/02/2023, 6:17 PM

## 2023-06-02 NOTE — Progress Notes (Addendum)
Occupational Therapy Session Note  Patient Details  Name: Jared [name]. MRN: [id number] Date of Birth: [birth year]  Today's Date: 06/02/2023 OT Individual Time: 4098-1191 OT Individual Time Calculation (min): 75 min    Short Term Goals: Week 1:  OT Short Term Goal 1 (Week 1): pt will be able to don his pants with supervision. OT Short Term Goal 1 - Progress (Week 1): Met OT Short Term Goal 2 (Week 1): Pt will be able to don his socks with supervision using sock aid. OT Short Term Goal 2 - Progress (Week 1): Met OT Short Term Goal 3 (Week 1): pt will complete toilet transfers with CGA. OT Short Term Goal 3 - Progress (Week 1): Met OT Short Term Goal 4 (Week 1): pt will complete shower transfers with CGA. OT Short Term Goal 4 - Progress (Week 1): Met Week 2:  OT Short Term Goal 1 (Week 2): STGs = LTGs   Skilled Therapeutic Interventions/Progress Updates:    Pt seen this session to complete all tasks of self care including a shower. See ADL documentation below.  Pt demonstrated improved RLE motor control with standing balance and ambulating to bathroom.  He continues to need occasional cues to ensure he fully steps through.  He does well using red theraband cue (place on front of RW) as a cue to advance foot towards the band.  Reviewed his HEP recommendations.  Pt already has an UE exercise routine he uses at home.  Discussed upcoming discharge tomorrow and pt stated he feels he is ready.  He wants to continue to improve his leg strength.   Pt has met his OT goals and is ready for discharge.   Pt resting in w/c ready for his next PT session.  Therapy Documentation Precautions:  Precautions Precautions: Fall Precaution Comments: needs cue to fully advance R foot in gait about 50% of the time Restrictions Weight Bearing Restrictions Per Provider Order: No Pain: Pain Assessment Pain Scale: 0-10 Pain Score: 0-No pain ADL: ADL Eating: Independent Where Assessed-Eating:  Chair Grooming: Supervision/safety Where Assessed-Grooming: Standing at sink Upper Body Bathing: Supervision/safety Where Assessed-Upper Body Bathing: Shower Lower Body Bathing: Supervision/safety Where Assessed-Lower Body Bathing: Shower Upper Body Dressing: Setup Where Assessed-Upper Body Dressing: Chair Lower Body Dressing: Supervision/safety Where Assessed-Lower Body Dressing: Chair Toileting: Supervision/safety Where Assessed-Toileting: Teacher, adult education: Close supervision Toilet Transfer Method: Proofreader: Grab bars, Raised toilet seat Tub/Shower Transfer: Other (comment) (n/a) Walk-In Shower Transfer: Close supervision Film/video editor Method: Designer, industrial/product: Emergency planning/management officer, Grab bars ADL Comments: .   Therapy/Group: Individual Therapy  Anne Boltz 06/02/2023, 12:13 PM

## 2023-06-03 DIAGNOSIS — I639 Cerebral infarction, unspecified: Secondary | ICD-10-CM | POA: Diagnosis not present

## 2023-06-03 NOTE — Progress Notes (Signed)
Speech Language Pathology Discharge Summary  Patient Details  Name: Jared [name]. MRN: [id number] Date of Birth: 06/13/1940  Date of Discharge from SLP service:June 03, 2023  Today's Date: 06/03/2023   Patient has met 4 of 4 long term goals.  Patient to discharge at overall Min;Mod level.  Reasons goals not met:     Clinical Impression/Discharge Summary: Pt has made excellent gains and has met 4 of 4 LTG's this admission due to improved awareness, orientation, memory, and problem solving. Pt is currently an overall min to mod A for cognitive tasks. Pt/ family education complete and pt will discharge home with 24 hour supervision from family, friends, etc. Pt would benefit from outpatient/ home health f/u SLP services to maximize his independence.  Care Partner:  Caregiver Able to Provide Assistance: Yes  Type of Caregiver Assistance: Cognitive;Physical  Recommendation:  Home Health SLP;Outpatient SLP;24 hour supervision/assistance  Rationale for SLP Follow Up: Maximize cognitive function and independence   Equipment: none   Reasons for discharge: Treatment goals met;Discharged from hospital   Patient/Family Agrees with Progress Made and Goals Achieved: Yes    Renaee Munda 06/03/2023, 8:20 AM

## 2023-06-03 NOTE — Progress Notes (Signed)
Patient's Wife arrived to the unit. Patient is packed and ready to go. Significant other and patient having no further questions at this time. All items packed and ready. Nurse Tech assisting in bringing patient to their vehicle/

## 2023-06-03 NOTE — Plan of Care (Signed)
Problem: RH Balance Goal: LTG Patient will maintain dynamic sitting balance (PT) Description: LTG:  Patient will maintain dynamic sitting balance with assistance during mobility activities (PT) Outcome: Completed/Met Flowsheets (Taken 06/03/2023 0651) LTG: Pt will maintain dynamic sitting balance during mobility activities with:: Independent Goal: LTG Patient will maintain dynamic standing balance (PT) Description: LTG:  Patient will maintain dynamic standing balance with assistance during mobility activities (PT) Outcome: Completed/Met Flowsheets (Taken 06/03/2023 0651) LTG: Pt will maintain dynamic standing balance during mobility activities with:: Supervision/Verbal cueing   Problem: Sit to Stand Goal: LTG:  Patient will perform sit to stand with assistance level (PT) Description: LTG:  Patient will perform sit to stand with assistance level (PT) Outcome: Completed/Met Flowsheets (Taken 05/22/2023 1526 by Casimiro Needle M, PT) LTG: PT will perform sit to stand in preparation for functional mobility with assistance level: Supervision/Verbal cueing   Problem: RH Bed Mobility Goal: LTG Patient will perform bed mobility with assist (PT) Description: LTG: Patient will perform bed mobility with assistance, with/without cues (PT). Outcome: Completed/Met Flowsheets (Taken 06/03/2023 0651) LTG: Pt will perform bed mobility with assistance level of: Independent   Problem: RH Bed to Chair Transfers Goal: LTG Patient will perform bed/chair transfers w/assist (PT) Description: LTG: Patient will perform bed to chair transfers with assistance (PT). Outcome: Completed/Met Flowsheets (Taken 05/22/2023 1526 by Casimiro Needle M, PT) LTG: Pt will perform Bed to Chair Transfers with assistance level: Supervision/Verbal cueing   Problem: RH Car Transfers Goal: LTG Patient will perform car transfers with assist (PT) Description: LTG: Patient will perform car transfers with assistance (PT). Outcome:  Completed/Met Flowsheets (Taken 05/22/2023 1526 by Casimiro Needle M, PT) LTG: Pt will perform car transfers with assist:: Contact Guard/Touching assist   Problem: RH Ambulation Goal: LTG Patient will ambulate in controlled environment (PT) Description: LTG: Patient will ambulate in a controlled environment, # of feet with assistance (PT). Outcome: Completed/Met Flowsheets (Taken 05/22/2023 1526 by Ginny Forth, PT) LTG: Pt will ambulate in controlled environ  assist needed:: Contact Guard/Touching assist LTG: Ambulation distance in controlled environment: 121ft using LRAD Goal: LTG Patient will ambulate in home environment (PT) Description: LTG: Patient will ambulate in home environment, # of feet with assistance (PT). Outcome: Completed/Met Flowsheets (Taken 05/22/2023 1526 by Ginny Forth, PT) LTG: Pt will ambulate in home environ  assist needed:: Contact Guard/Touching assist LTG: Ambulation distance in home environment: 71ft using LRAD   Problem: RH Wheelchair Mobility Goal: LTG Patient will propel w/c in controlled environment (PT) Description: LTG: Patient will propel wheelchair in controlled environment, # of feet with assist (PT) Outcome: Completed/Met Flowsheets (Taken 05/22/2023 1526 by Ginny Forth, PT) LTG: Pt will propel w/c in controlled environ  assist needed:: Supervision/Verbal cueing LTG: Propel w/c distance in controlled environment: 177ft Goal: LTG Patient will propel w/c in home environment (PT) Description: LTG: Patient will propel wheelchair in home environment, # of feet with assistance (PT). Outcome: Completed/Met Flowsheets (Taken 05/22/2023 1526 by Ginny Forth, PT) LTG: Pt will propel w/c in home environ  assist needed:: Supervision/Verbal cueing LTG: Propel w/c distance in home environment: 34ft   Problem: RH Stairs Goal: LTG Patient will ambulate up and down stairs w/assist (PT) Description: LTG: Patient will ambulate up and down # of stairs with  assistance (PT) Outcome: Completed/Met Flowsheets (Taken 05/22/2023 1526 by Casimiro Needle M, PT) LTG: Pt will ambulate up/down stairs assist needed:: Contact Guard/Touching assist LTG: Pt will  ambulate up and down number of stairs: 4 steps using  L HR per home set-up

## 2023-06-03 NOTE — Progress Notes (Signed)
PROGRESS NOTE   Subjective/Complaints:  Pt feels ready for d/c  ROS:   Pt denies SOB, abd pain, CP, N/V/C/D, and vision changes   Objective:   No results found.  No results for input(s): "WBC", "HGB", "HCT", "PLT" in the last 72 hours.  No results for input(s): "NA", "K", "CL", "CO2", "GLUCOSE", "BUN", "CREATININE", "CALCIUM" in the last 72 hours.    Intake/Output Summary (Last 24 hours) at 06/03/2023 0828 Last data filed at 06/03/2023 0711 Gross per 24 hour  Intake 600 ml  Output 875 ml  Net -275 ml        Physical Exam: Vital Signs Blood pressure (!) 146/99, pulse 64, temperature 97.7 F (36.5 C), resp. rate 17, height 5\' 10"  (1.778 m), weight 75.9 kg, SpO2 96%.   General: No acute distress Mood and affect are appropriate Heart: Regular rate and rhythm no rubs murmurs or extra sounds Lungs: Clear to auscultation, breathing unlabored, no rales or wheezes Abdomen: Positive bowel sounds, soft nontender to palpation, nondistended Extremities: No clubbing, cyanosis, or edema Skin: No evidence of breakdown, no evidence of rash   Neurologic: Cranial nerves II through XII intact, motor strength is 5/5 in bilateral deltoid, bicep, tricep, grip, hip flexor, knee extensors, ankle dorsiflexor and plantar flexor Sensory exam normal sensation to light touch  bilateral upper and lower extremities  Mild aphasia mainly word finding issues Musculoskeletal: Full range of motion in all 4 extremities. No joint swelling   Assessment/Plan: 1. Functional deficits due to left CVA Stable for D/C today F/u PCP in 3-4 weeks F/u PM&R 2 weeks F/u neuro 1-2 mo See D/C summary See D/C instructions  Was not driving or smoking prior to CVA Care Tool:  Bathing    Body parts bathed by patient: Chest, Abdomen, Front perineal area, Buttocks, Right upper leg, Left upper leg, Face, Right arm, Left arm, Right lower leg, Left lower leg    Body parts bathed by helper: Right arm, Left arm, Left lower leg, Right lower leg     Bathing assist Assist Level: Supervision/Verbal cueing     Upper Body Dressing/Undressing Upper body dressing   What is the patient wearing?: Pull over shirt    Upper body assist Assist Level: Set up assist    Lower Body Dressing/Undressing Lower body dressing      What is the patient wearing?: Underwear/pull up, Pants     Lower body assist Assist for lower body dressing: Supervision/Verbal cueing     Toileting Toileting    Toileting assist Assist for toileting: Supervision/Verbal cueing     Transfers Chair/bed transfer  Transfers assist  Chair/bed transfer activity did not occur: Safety/medical concerns (unsafe to get up)  Chair/bed transfer assist level: Supervision/Verbal cueing     Locomotion Ambulation   Ambulation assist      Assist level: Supervision/Verbal cueing Assistive device: Walker-rolling Max distance: 200 ft   Walk 10 feet activity   Assist     Assist level: Supervision/Verbal cueing Assistive device: Walker-rolling   Walk 50 feet activity   Assist Walk 50 feet with 2 turns activity did not occur: Safety/medical concerns  Assist level: Supervision/Verbal cueing Assistive device: Walker-rolling  Walk 150 feet activity   Assist Walk 150 feet activity did not occur: Safety/medical concerns  Assist level: Supervision/Verbal cueing Assistive device: Walker-rolling    Walk 10 feet on uneven surface  activity   Assist Walk 10 feet on uneven surfaces activity did not occur: Safety/medical concerns   Assist level: Contact Guard/Touching assist Assistive device: Walker-rolling   Wheelchair     Assist Is the patient using a wheelchair?: Yes Type of Wheelchair: Manual (and power)    Wheelchair assist level: Supervision/Verbal cueing Max wheelchair distance: 170 ft    Wheelchair 50 feet with 2 turns activity    Assist         Assist Level: Supervision/Verbal cueing   Wheelchair 150 feet activity     Assist      Assist Level: Supervision/Verbal cueing   Blood pressure (!) 146/99, pulse 64, temperature 97.7 F (36.5 C), resp. rate 17, height 5\' 10"  (1.778 m), weight 75.9 kg, SpO2 96%.  Medical Problem List and Plan: 1. 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  Antithrombotics: -DVT/anticoagulation:  Pharmaceutical: Lovenox.  Venous Doppler studies negative             -antiplatelet therapy: Aspirin 81 mg daily and Plavix 75 mg day x 3 weeks then aspirin alone starting 06/05/23 3. Pain Management: Tylenol as needed  1/25- denies pain- con't tylenol prn 4. Mood/Behavior/Sleep: Provide emotional support             -antipsychotic agents: N/A 5. Neuropsych/cognition: This patient may be capable of making decisions on his own behalf. 6. Skin/Wound Care: Routine skin checks 7. Fluids/Electrolytes/Nutrition: encourage PO  -continue protein supp for low albumin 8.  Hyperlipidemia.  Continue Crestor  9.  Previous NPH workup/cognitive impairment.has bilateral periventricular white matter chronic infarcts   Followed outpatient by neurology service Dr. Vickey Huger.  B12, TSH, ESR, RPR normal. -Continue f/u with Dr. Vickey Huger and [facility] neurology as outpatient -Uses Wheelchair/Scooter at home- chronic balance issue  10.  Left CCA stenosis.  CTA head and neck 60% stenosis distal  left CCA secondary to plaque with ulceration. -Outpatient f/u with vascular surgery  11.  Hypertension- ? Diagnosis . BPs normal except for occ elevation , will monitor .  Patient on no antihypertensive medications prior to admission. Vitals:   06/02/23 1924 06/03/23 0438  BP: (!) 143/86 (!) 146/99  Pulse: 68 64  Resp: 17 17  Temp: 98.3 F (36.8 C) 97.7 F (36.5 C)  SpO2: 95% 96%    1/25-1/26- BP controlled- con't to monitor  12.  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  Follow-up outpatient. Consider Qutenza. Topamax started at night  13. Leukocytosis: due to UTI, resolved     Latest Ref Rng & Units 05/26/2023    5:21 AM 05/23/2023    6:47 AM 05/22/2023  6:18 AM  CBC  WBC 4.0 - 10.5 K/uL 7.2  10.5  19.3   Hemoglobin 13.0 - 17.0 g/dL 78.2  95.6  21.3   Hematocrit 39.0 - 52.0 % 41.3  40.6  40.8   Platelets 150 - 400 K/uL 316  238  257      14. Headache:Resolved topamax started at night and prn Fioricet ordered for the day  Not needing prn fioricet or acetaminophen, will hold topamax and monitor   1/26- denies any HA's- pain    LOS: 13 days A FACE TO FACE EVALUATION WAS PERFORMED  Erick Colace 06/03/2023, 8:28 AM

## 2023-06-03 NOTE — Plan of Care (Signed)
  Problem: RH Cognition - SLP Goal: RH LTG Patient will demonstrate orientation with cues Description:  LTG:  Patient will demonstrate orientation to person/place/time/situation with cues (SLP)   Outcome: Completed/Met Flowsheets Taken 06/03/2023 0813 by Renaee Munda, CCC-SLP LTG: Patient will demonstrate orientation using cueing (SLP): Minimal Assistance - Patient > 75% Taken 05/22/2023 1154 by Sockwell, Cassidi F, CCC-SLP LTG Patient will demonstrate orientation to:  Time  Situation  Place  Person   Problem: RH Problem Solving Goal: LTG Patient will demonstrate problem solving for (SLP) Description: LTG:  Patient will demonstrate problem solving for basic/complex daily situations with cues  (SLP) Outcome: Completed/Met Flowsheets Taken 06/03/2023 0813 by Renaee Munda, CCC-SLP LTG: Patient will demonstrate problem solving for (SLP): Basic daily situations Taken 05/22/2023 1154 by Sockwell, Cassidi F, CCC-SLP LTG Patient will demonstrate problem solving for: Minimal Assistance - Patient > 75%   Problem: RH Memory Goal: LTG Patient will demonstrate ability for day to day (SLP) Description: LTG:   Patient will demonstrate ability for day to day recall/carryover during cognitive/linguistic activities with assist  (SLP) Outcome: Completed/Met Flowsheets (Taken 06/03/2023 0813) LTG: Patient will demonstrate ability for day to day recall: Biographical information LTG: Patient will demonstrate ability for day to day recall/carryover during cognitive/linguistic activities with assist (SLP): Moderate Assistance - Patient 50 - 74%   Problem: RH Awareness Goal: LTG: Patient will demonstrate awareness during functional activites type of (SLP) Description: LTG: Patient will demonstrate awareness during functional activites type of (SLP) Outcome: Completed/Met Flowsheets Taken 06/03/2023 0813 by Renaee Munda, CCC-SLP Patient will demonstrate during cognitive/linguistic activities awareness  type of: Emergent Taken 05/22/2023 1154 by Sockwell, Cassidi F, CCC-SLP LTG: Patient will demonstrate awareness during cognitive/linguistic activities with assistance of (SLP): Moderate Assistance - Patient 50 - 74%

## 2023-06-04 ENCOUNTER — Telehealth: Payer: Self-pay

## 2023-06-04 NOTE — Progress Notes (Signed)
Inpatient Rehabilitation Care Coordinator Discharge Note   Patient Details  Name: Jared [name]. MRN: [id number] Date of Birth: 1941/04/22   Discharge location: D/c to home  Length of Stay: 12 days  Discharge activity level: CGA  Home/community participation: Limited  Patient response WU:[id number] Literacy - How often do you need to have someone help you when you read instructions, pamphlets, or other written material from your doctor or pharmacy?: Never  Patient response YN:[id number] Isolation - How often do you feel lonely or isolated from those around you?: Never  Services provided included: MD, PT, RD, OT, SLP, CM, Pharmacy, Neuropsych, SW, TR, Industrial/product designer Services:  Field seismologist Utilized: Private Insurance [employer]  Choices offered to/list presented to: patient wife  Follow-up services arranged:  Home Health, DME Home Health Agency: [facility] for HHPT/OT/aide    DME : 3in1 bedside commode and shower chair with back- to be delivered by [place]    Patient response to transportation need: Is the patient able to respond to transportation needs?: Yes In the past 12 months, has lack of transportation kept you from medical appointments or from getting medications?: No In the past 12 months, has lack of transportation kept you from meetings, work, or from getting things needed for daily living?: No   Patient/Family verbalized understanding of follow-up arrangements:  Yes  Individual responsible for coordination of the follow-up plan: contact pt wife [name]  Confirmed correct DME delivered: Gretchen Short 06/04/2023    Comments (or additional information):fam edu completed  Summary of Stay    Date/Time Discharge Planning CSW  06/02/23 1214 Pt will discharge to home with wife. DME ordered through [facility]- shower chair with back and 3in1 BSC. [facility] for HHPT/OT/SLP/[facility]ide. [facility] made [facility] referral to aide program. Melvenia Needles edu completed with pt wife.  SW will  confirm there are no barriers to discharge. AAC  05/26/23 1319 Pt will discharge to home with wife. SW will confirm there are no barriers to discharge. AAC       Trayven Lumadue A Lula Olszewski

## 2023-06-04 NOTE — Telephone Encounter (Signed)
Transitional Care call--who you talked with    Are you/is patient experiencing any problems since coming home? Are there any questions regarding any aspect of care? NO questions at this time Are there any questions regarding medications administration/dosing? Are meds being taken as prescribed? Patient should review meds with caller to confirm No questions at this time Have there been any falls? No falls Has Home Health been to the house and/or have they contacted you? If not, have you tried to contact them? Can we help you contact them? Yes Are bowels and bladder emptying properly? Are there any unexpected incontinence issues? If applicable, is patient following bowel/bladder programs? Yes no problems in this area Any fevers, problems with breathing, unexpected pain? No pain at this time  Are there any skin problems or new areas of breakdown? No Has the patient/family member arranged specialty MD follow up (ie cardiology/neurology/renal/surgical/etc)?  Can we help arrange? Appts in progress Does the patient need any other services or support that we can help arrange? Not at this time  Are caregivers following through as expected in assisting the patient? Yes Has the patient quit smoking, drinking alcohol, or using drugs as recommended? Yes   Appointment time 06/16/23 to arrive at 11:10 am with [facility]  [address]

## 2023-06-13 NOTE — Progress Notes (Signed)
 Subjective:    Patient ID: Jared [name]., male    DOB: 1941-01-18, 83 y.o.   MRN: [id number]  NWG:[name] is a 83 y.o. male who is here for Transitional Care visit for follow up of his Cardioembolic stroke and hyperlipidemia. He presented to [facility] ED on 05/15/2023 with complaints of increased confusion.  And abnormal speech  Dr Lydia Sams H&P Note: 05/15/2023  Chief Complaint: Altered mental status, speech abnormality   HPI: Jared [name]. is a 83 y.o. male with medical history significant for HTN, cognitive impairment, remote colon cancer with prior right hemicolectomy who presented to the ED for evaluation of abnormal speech and increased confusion from baseline.  History is supplemented by his spouse at bedside.   Patient has chronic ambulatory dysfunction, uses a motorized scooter to transport in the house.  He has baseline memory issues.  He was LKW 2100 last night 05/14/2023.   This morning when he awoke he had new garbled speech and difficulty saying what he wanted to say.  He seemed more confused than his usual baseline.  He did not have any new obvious weakness in his extremities.   He does not take any prescription medications or aspirin .  He takes multiple over-the-counter vitamins and supplements.  CT Head: WO Contrast:  IMPRESSION: 1. No acute intracranial abnormality. 2. Stable advanced atrophy and chronic microvascular ischemic changes.  CTA:  IMPRESSION: 1. No intracranial large vessel occlusion or significant stenosis. 2. Approximately 60% stenosis in the distal left CCA, secondary to noncalcified plaque, with an area of focal plaque ulceration. 3. Severe stenosis at the takeoff of the left vertebral artery from the aorta.  MR: Brain WO Contrast IMPRESSION: 1. Small acute or early subacute infarcts in the left parietal and right frontal and occipital lobes. Given involvement of multiple vascular territories, consider an embolic etiology. 2. Advanced  chronic microvascular ischemic change and cerebral atrophy (ICD10-G31.9).  Neurology Consulted:   Dr Christiane Cowing Note: 05/16/2023 Acute Ischemic Infarct:  Small acute/early subacute infarcts in the right occipital and frontal, as well as left parietal lobes Etiology:  likely cardioembolic  Code Stroke CT head No acute abnormality. Small vessel disease.  CTA head & neck Approximately 60% stenosis in the distal left CCA, secondary to noncalcified plaque, with an area of focal plaque ulceration. Severe stenosis at the takeoff of the left vertebral artery from the aorta. MRI  small acute/early subacute infarcts in left parietal and right frontal and occipital lobes  2D Echo EF 70-75% LE venous Doppler no DVT Recommend loop recorder before discharge to rule out A-fib - cardiology aware and will do prior to discharge LDL 124 HgbA1c 6.0 VTE prophylaxis - Lovenox  No antithrombotic prior to admission, now on aspirin  81 mg daily and clopidogrel  75 mg daily for 3 weeks and then aspirin  81mg  alone. Therapy recommendations:  CIR Disposition:   Pending  Jared [name] underwent: Dr Percell Boyers  Procedure Laterality Anesthesia  LOOP RECORDER INSERTION N/A    Jared [name] was admitted to inpatient Rehabilitation on 05/21/2023 and discharged home on 06/03/2023. He is receiving Home Health Therapy with [facility]. He denies any pain . He rates his pain 0. Also reports he has a good Appetite.   Wife in room, all questions answered.   Pain Inventory Average Pain 0 Pain Right Now 0 My pain is  No pain  LOCATION OF PAIN  No pain  BOWEL Number of stools per week: 4 Oral laxative use Yes  Type  of laxative Senokot   BLADDER Pads  Bladder incontinence Yes  Frequent urination Yes  Difficulty starting stream Yes  Incomplete bladder emptying Yes    Mobility walk with assistance use a walker how many minutes can you walk? 5 minutes ability to climb steps?  yes do you drive?  no needs help with transfers Do  you have any goals in this area?  yes  Function retired I need assistance with the following:  dressing, bathing, toileting, meal prep, household duties, and shopping Do you have any goals in this area?  yes  Neuro/Psych bladder control problems weakness trouble walking confusion  Prior Studies Any changes since last visit?  no  Physicians involved in your care    Family History  Problem Relation Age of Onset   Other Mother        old age   Hodgkin's lymphoma Mother    Heart failure Father    Social History   Socioeconomic History   Marital status: Married    Spouse name: Not on file   Number of children: Not on file   Years of education: Not on file   Highest education level: Not on file  Occupational History   Not on file  Tobacco Use   Smoking status: Never   Smokeless tobacco: Never  Vaping Use   Vaping status: Never Used  Substance and Sexual Activity   Alcohol  use: Yes    Alcohol /week: 1.0 standard drink of alcohol     Types: 1 Glasses of wine per week   Drug use: No   Sexual activity: Not on file  Other Topics Concern   Not on file  Social History Narrative   Not on file   Social Drivers of Health   Financial Resource Strain: Not on file  Food Insecurity: No Food Insecurity (05/16/2023)   Hunger Vital Sign    Worried About Running Out of Food in the Last Year: Never true    Ran Out of Food in the Last Year: Never true  Transportation Needs: No Transportation Needs (05/16/2023)   PRAPARE - Administrator, Civil Service (Medical): No    Lack of Transportation (Non-Medical): No  Physical Activity: Not on file  Stress: Not on file  Social Connections: Moderately Integrated (05/16/2023)   Social Connection and Isolation Panel [NHANES]    Frequency of Communication with Friends and Family: Once a week    Frequency of Social Gatherings with Friends and Family: Three times a week    Attends Religious Services: More than 4 times per year     Active Member of Clubs or Organizations: No    Attends Banker Meetings: Never    Marital Status: Married   Past Surgical History:  Procedure Laterality Date   CATARACT EXTRACTION, BILATERAL     COLON SURGERY  2004   R hemicolectomy - removed due to cancer   COLONOSCOPY     HEMICOLECTOMY     HERNIA REPAIR  2005   LOOP RECORDER INSERTION N/A 05/19/2023   Procedure: LOOP RECORDER INSERTION;  Surgeon: Tylene Galla, PA-C;  Location: [facility];  Service: Cardiovascular;  Laterality: N/A;   REVERSE SHOULDER ARTHROPLASTY Left 11/29/2020   Procedure: REVERSE SHOULDER ARTHROPLASTY;  Surgeon: Micheline Ahr, MD;  Location: [facility];  Service: Orthopedics;  Laterality: Left;   Past Medical History:  Diagnosis Date   colon cancer    History of kidney stones    Hypertension    Patient  denies   Neuropathy due to chemotherapeutic drug [facility])    Neurologist says doesnt have it   Pre-diabetes    There were no vitals taken for this visit.  Opioid Risk Score:   Fall Risk Score:  `1  Depression screen PHQ 2/9      No data to display          Review of Systems  Genitourinary:  Positive for difficulty urinating and frequency.       Bladder incontinence, incomplete bladder emptying   Musculoskeletal:  Positive for gait problem.  Neurological:  Positive for weakness.  Psychiatric/Behavioral:  Positive for confusion.       Objective:   Physical Exam Vitals and nursing note reviewed.  Constitutional:      Appearance: Normal appearance.  Cardiovascular:     Rate and Rhythm: Normal rate and regular rhythm.     Pulses: Normal pulses.     Heart sounds: Normal heart sounds.  Pulmonary:     Effort: Pulmonary effort is normal.     Breath sounds: Normal breath sounds.  Musculoskeletal:        General: No tenderness.     Comments: Normal Muscle Bulk and Muscle Testing Reveals:  Upper Extremities: Right: Full ROM and Muscle Strength 5/5 Left Upper Extremity:  Decreased ROM 90 Degrees and Muscle Strength 5/5  Lower Extremities: Full ROM and Muscle Strength 5/5 Arrived in wheelchair      Skin:    General: Skin is warm and dry.  Neurological:     Mental Status: He is alert.  Psychiatric:        Mood and Affect: Mood normal.        Behavior: Behavior normal.         Assessment & Plan:  Cardioembolic Stroke : Mr. and Mrs Jared [name] was instructed to call [facility], to scheduled an appointment.  This provider also called  [facility] and left a message. Continue current medication regimen.  .Hyperlipidemia: Continue current medication regimen. Mr. And Mrs Jared [name] was instructed to call PCP to schedule HFU appointment, they verbalize understanding.  F/U with Dr Sharl Davies in 4- 6 weeks

## 2023-06-16 ENCOUNTER — Encounter: Payer: Self-pay | Admitting: Registered Nurse

## 2023-06-16 ENCOUNTER — Telehealth: Payer: Self-pay

## 2023-06-16 ENCOUNTER — Encounter: Payer: Medicare PPO | Attending: Registered Nurse | Admitting: Registered Nurse

## 2023-06-16 VITALS — BP 124/77 | HR 62 | Ht 70.0 in

## 2023-06-16 DIAGNOSIS — I639 Cerebral infarction, unspecified: Secondary | ICD-10-CM | POA: Insufficient documentation

## 2023-06-16 DIAGNOSIS — E7849 Other hyperlipidemia: Secondary | ICD-10-CM | POA: Diagnosis not present

## 2023-06-16 MED ORDER — ROSUVASTATIN CALCIUM 20 MG PO TABS: 20.0000 mg | ORAL_TABLET | Freq: Every day | ORAL | 0 refills | Status: AC

## 2023-06-16 NOTE — Telephone Encounter (Signed)
 We received a call from Ford Ide NP at physical medicine and rehab. She advised that the wife has called and no one has given her a call back yet. I do not see where a voicemail was left from this patient. We have sent multiple messages to Dr Albertina Hugger to review - she has seen him in the past and he is most recently established with [facility]. We have sent messages to Dr Albertina Hugger 2/3 and 2/5 with no answer.  Per Ford Ide, the patients wife is requesting a call back from the clinic manager to address some concerns she has - she can be reached at [phone]

## 2023-06-16 NOTE — Telephone Encounter (Signed)
 Spoke with patients wife - she advised that [facility] is too far of a drive for her and her husband, which is why she was wanting to be seen here. She advised taht he no longer has neuropathy and just needs to see an MD for the stroke f/u. She does not want him to follow up with Dr Albertina Hugger.  She is requesting a provider switch and would like him to see Dr Salli Crawley for this appointment.  Please advise if this is acceptable.  I also confirmed with patients wife that this is what she was wanting to speak with the clinic manager about. A call back from management is no longer needed.  Thanks!

## 2023-07-09 ENCOUNTER — Encounter: Payer: Self-pay | Admitting: Diagnostic Neuroimaging

## 2023-07-09 ENCOUNTER — Ambulatory Visit (INDEPENDENT_AMBULATORY_CARE_PROVIDER_SITE_OTHER): Payer: Medicare PPO | Admitting: Diagnostic Neuroimaging

## 2023-07-09 VITALS — BP 107/76 | HR 109 | Ht 70.0 in

## 2023-07-09 DIAGNOSIS — R269 Unspecified abnormalities of gait and mobility: Secondary | ICD-10-CM

## 2023-07-09 DIAGNOSIS — I6522 Occlusion and stenosis of left carotid artery: Secondary | ICD-10-CM

## 2023-07-09 DIAGNOSIS — I6310 Cerebral infarction due to embolism of unspecified precerebral artery: Secondary | ICD-10-CM | POA: Diagnosis not present

## 2023-07-09 DIAGNOSIS — R4189 Other symptoms and signs involving cognitive functions and awareness: Secondary | ICD-10-CM

## 2023-07-09 NOTE — Progress Notes (Signed)
 [facility]  PATIENT: Jared [name]. DOB: 05/05/1941  REFERRING CLINICIAN: Marvel Plan, MD HISTORY FROM: patient  REASON FOR VISIT: new consult   HISTORICAL  CHIEF COMPLAINT:  Chief Complaint  Patient presents with   Cerebrovascular Accident    Rm 6 with spouse  Pt is well, reports he is having some R sided weakness and imbalance. Wife also reports some cognitive concerns.     HISTORY OF PRESENT ILLNESS:   NEW HPI (07/09/23, VRP): 83 year old male with history of hypertension, cognitive and gait decline, colon cancer, chemotherapy side effects, presented to the hospital 05/15/2023 due to a few minutes of speech arrest, garbled speech, confusion.  Went to the hospital via EMS.  MRI demonstrated several punctate ischemic infarcts in the right frontal, right acceptable and left parietal regions.  Patient was admitted for stroke workup.  Symptoms gradually improved.  He was discharged home with a implanted loop recorder to rule out atrial fibrillation.  Since that time patient continues to have progressive gait and cognitive decline.  I reviewed prior notes from Dr. Vickey [name] and Dr. Marney [name] ([facility]) who evaluated this patient for these symptoms.  It appears that symptoms started around 2019 and progressively worsened over time.  Had EMG nerve conduction testing which was unremarkable for large fiber neuropathy.  MRI demonstrated moderate ventriculomegaly, on ex-vacuo basis.  He had spinal tap testing to see if patient may be candidate for VP shunt possible NPH but spinal tap did not prove symptoms.  He was recommended to go to the NPH clinic at [facility] but patient and wife declined.  He was also given empiric trial of carbidopa levodopa which she took for a few days but did not improve symptoms.   PRIOR HPI (11/01/22, Dr. Marney [name] [facility]): "HPI: Mr. Jared [name] is a 83 y.o. male referred for evaluation of gait difficulty. EMG/NCS did not show any evidence of  neuropathy. A trial of Sinemet for Parkinsonism was ineffective. Imaging findings (MRI brain) are concerning for possible NPH. He did undergo a spinal tap but it was not large volume and so findings are inconclusive.   He thinks that his neuropathy type symptoms started soon after chemotherapy in 2013. He had some issues with driving at that time due to trouble feeling the pedals. He does think that symptoms transiently improved for several years but then recurred approximately 2019.  Approximately 2019 he began to notice difficulties with the walking. He developed shuffling and difficulty lifting the legs bilaterally. He began falling later on approximately 2021 he fell and broke his shoulder. He began using a cane roughly 5 years ago. A couple years ago he began to need to use a walker.   Three years ago he had to buy a wheelchair for trips. He has had a power chair for several years. Then over the past year he began to need the power chair around the house.  He has significant loss of sensation in the feet. He doesn't notice significant tingling, just numbness. The hands are not numb or tingly. He has gotten progressively weaker in the legs. Years ago he used to run several miles a day.  He denies any saddle anesthesia. He started using depends for about a year due to it taking a long time to get to the bathroom.   Memory/processing has been impaired. He has trouble with short term memory.  He saw an outside neurologist Dr. Vickey [name] who was evaluated him for NPH due to concerning MRI findings.  He underwent LP with pre and post assessment of gait/cognition without significant change. His outside neurologist was concerned for neuropathy as an etiology of his symptoms.   Notably, he underwent EMG/NCS in 2021 which was normal without any electrodiagnostic evidence of large fiber neuropathy. MRI of the thoracic/lumbar spine January 2024 was unremarkable.  Current status: -Overall Mr. Jared [name] reports  ongoing symptom progression. -He struggles to walk even more than a few steps and cannot pick up the feet when he walks, so he shuffles. -He does 10 squats daily. -He did not do additional PT but does home exercises. -He has some weights that he is also using in the arms -He thinks that his memory is worsening a bit -His thinking doesn't seem to be as clear -It takes him a long time to process and put a sentence together -He is otherwise mostly in the wheelchair all day"   REVIEW OF SYSTEMS: Full 14 system review of systems performed and negative with exception of: as per HPI.  ALLERGIES: No Known Allergies  HOME MEDICATIONS: Outpatient Medications Prior to Visit  Medication Sig Dispense Refill   acetaminophen (TYLENOL) 325 MG tablet Take 2 tablets (650 mg total) by mouth every 4 (four) hours as needed for mild pain (pain score 1-3) (or temp > 37.5 C (99.5 F)).     aspirin EC 81 MG tablet Take 1 tablet (81 mg total) by mouth daily. Swallow whole. 30 tablet 0   empagliflozin (JARDIANCE) 25 MG TABS tablet Take 25 mg by mouth daily.     polyethylene glycol (MIRALAX / GLYCOLAX) 17 g packet Take 17 g by mouth daily.     rosuvastatin (CRESTOR) 20 MG tablet Take 1 tablet (20 mg total) by mouth daily. 30 tablet 0   senna-docusate (SENOKOT-S) 8.6-50 MG tablet Take 1 tablet by mouth 2 (two) times daily.     No facility-administered medications prior to visit.    PAST MEDICAL HISTORY: Past Medical History:  Diagnosis Date   colon cancer    History of kidney stones    Hypertension    Patient denies   Neuropathy due to chemotherapeutic drug [facility])    Neurologist says doesnt have it   Pre-diabetes     PAST SURGICAL HISTORY: Past Surgical History:  Procedure Laterality Date   CATARACT EXTRACTION, BILATERAL     COLON SURGERY  2004   R hemicolectomy - removed due to cancer   COLONOSCOPY     HEMICOLECTOMY     HERNIA REPAIR  2005   LOOP RECORDER INSERTION N/A 05/19/2023   Procedure:  LOOP RECORDER INSERTION;  Surgeon: Jared Freer, PA-C;  Location: [facility];  Service: Cardiovascular;  Laterality: N/A;   REVERSE SHOULDER ARTHROPLASTY Left 11/29/2020   Procedure: REVERSE SHOULDER ARTHROPLASTY;  Surgeon: Bjorn Pippin, MD;  Location: [facility];  Service: Orthopedics;  Laterality: Left;    FAMILY HISTORY: Family History  Problem Relation Age of Onset   Other Mother        old age   Hodgkin's lymphoma Mother    Heart failure Father     SOCIAL HISTORY: Social History   Socioeconomic History   Marital status: Married    Spouse name: Not on file   Number of children: Not on file   Years of education: Not on file   Highest education level: Not on file  Occupational History   Not on file  Tobacco Use   Smoking status: Never   Smokeless tobacco: Never  Vaping  Use   Vaping status: Never Used  Substance and Sexual Activity   Alcohol use: Yes    Alcohol/week: 1.0 standard drink of alcohol    Types: 1 Glasses of wine per week   Drug use: No   Sexual activity: Not on file  Other Topics Concern   Not on file  Social History Narrative   Not on file   Social Drivers of Health   Financial Resource Strain: Not on file  Food Insecurity: No Food Insecurity (05/16/2023)   Hunger Vital Sign    Worried About Running Out of Food in the Last Year: Never true    Ran Out of Food in the Last Year: Never true  Transportation Needs: No Transportation Needs (05/16/2023)   PRAPARE - Administrator, Civil Service (Medical): No    Lack of Transportation (Non-Medical): No  Physical Activity: Not on file  Stress: Not on file  Social Connections: Moderately Integrated (05/16/2023)   Social Connection and Isolation Panel [NHANES]    Frequency of Communication with Friends and Family: Once a week    Frequency of Social Gatherings with Friends and Family: Three times a week    Attends Religious Services: More than 4 times per year    Active Member of  Clubs or Organizations: No    Attends Banker Meetings: Never    Marital Status: Married  Catering manager Violence: Not At Risk (05/16/2023)   Humiliation, Afraid, Rape, and Kick questionnaire    Fear of Current or Ex-Partner: No    Emotionally Abused: No    Physically Abused: No    Sexually Abused: No     PHYSICAL EXAM  GENERAL EXAM/CONSTITUTIONAL: Vitals:  Vitals:   07/09/23 1043  BP: 107/76  Pulse: (!) 109  Height: 5\' 10"  (1.778 m)   Body mass index is 24.01 kg/m. Wt Readings from Last 3 Encounters:  05/21/23 167 lb 5.3 oz (75.9 kg)  05/16/23 173 lb 15.1 oz (78.9 kg)  07/16/21 167 lb (75.8 kg)   Patient is in no distress; well developed, nourished and groomed; neck is supple  CARDIOVASCULAR: Examination of carotid arteries is normal; no carotid bruits Regular rate and rhythm, no murmurs Examination of peripheral vascular system by observation and palpation is normal  EYES: Ophthalmoscopic exam of optic discs and posterior segments is normal; no papilledema or hemorrhages No results found.  MUSCULOSKELETAL: Gait, strength, tone, movements noted in Neurologic exam below  NEUROLOGIC: MENTAL STATUS:     07/16/2021    2:12 PM  MMSE - Mini Mental State Exam  Orientation to time 4  Orientation to Place 4  Registration 3  Attention/ Calculation 5  Recall 3  Language- name 2 objects 2  Language- repeat 1  Language- follow 3 step command 3  Language- read & follow direction 1  Write a sentence 1  Copy design 1  Total score 28   awake, alert, oriented to person, PLACE SLOW RESPONSES RECALL INTACT FLUENCY DECREASED  CRANIAL NERVE:  2nd - no papilledema on fundoscopic exam 2nd, 3rd, 4th, 6th - pupils equal and reactive to light, visual fields full to confrontation, extraocular muscles intact, no nystagmus 5th - facial sensation symmetric 7th - facial strength symmetric 8th - hearing intact 9th - palate elevates symmetrically, uvula  midline 11th - shoulder shrug symmetric 12th - tongue protrusion midline SOFT, HOARSE VOICE  MOTOR:  normal bulk and tone, full strength in the BUE, BLE GENERALIZED PSYCHOMOTOR SLOWING  SENSORY:  normal and  symmetric to light touch, temperature, vibration  COORDINATION:  finger-nose-finger, fine finger movements normal  REFLEXES:  deep tendon reflexes 1+ and symmetric  GAIT/STATION:  IN WHEEL CHAIR     DIAGNOSTIC DATA (LABS, IMAGING, TESTING) - I reviewed patient records, labs, notes, testing and imaging myself where available.  Lab Results  Component Value Date   WBC 7.2 05/26/2023   HGB 14.0 05/26/2023   HCT 41.3 05/26/2023   MCV 89.0 05/26/2023   PLT 316 05/26/2023      Component Value Date/Time   NA 135 05/26/2023 0521   NA 141 07/16/2021 1519   K 4.3 05/26/2023 0521   CL 103 05/26/2023 0521   CO2 25 05/26/2023 0521   GLUCOSE 156 (H) 05/26/2023 0521   BUN 19 05/26/2023 0521   BUN 20 07/16/2021 1519   CREATININE 0.96 05/26/2023 0521   CALCIUM 8.9 05/26/2023 0521   PROT 6.5 05/22/2023 0618   PROT 7.0 07/16/2021 1519   ALBUMIN 3.0 (L) 05/22/2023 0618   ALBUMIN 4.3 07/16/2021 1519   AST 19 05/22/2023 0618   ALT 20 05/22/2023 0618   ALKPHOS 89 05/22/2023 0618   BILITOT 0.8 05/22/2023 0618   BILITOT <0.2 07/16/2021 1519   GFRNONAA >60 05/26/2023 0521   GFRAA 95 02/08/2020 1220   Lab Results  Component Value Date   CHOL 190 05/15/2023   HDL 48 05/15/2023   LDLCALC 124 (H) 05/15/2023   TRIG 90 05/15/2023   CHOLHDL 4.0 05/15/2023   Lab Results  Component Value Date   HGBA1C 6.0 (H) 05/15/2023   Lab Results  Component Value Date   VITAMINB12 615 05/15/2023   Lab Results  Component Value Date   TSH 1.490 05/15/2023     ASSESSMENT AND [name]  83 y.o. year old male here with:  Dx:  1. Cerebrovascular accident (CVA) due to embolism of precerebral artery (HCC)   2. Cognitive decline   3. Gait difficulty   4. Stenosis of left carotid artery      [name]:  Small acute/early subacute infarcts in the right occipital and frontal, as well as left parietal lobes Etiology:  likely cardioembolic  Code Stroke CT head No acute abnormality. Small vessel disease.  CTA head & neck Approximately 60% stenosis in the distal left CCA, secondary to noncalcified plaque, with an area of focal plaque ulceration. Severe stenosis at the takeoff of the left vertebral artery from the aorta. MRI  small acute/early subacute infarcts in left parietal and right frontal and occipital lobes  2D Echo EF 70-75% LE venous Doppler no DVT Continue loop recorder monitoring LDL 124 HgbA1c 6.0\ No antithrombotic prior to admission, completed aspirin 81 mg daily and clopidogrel 75 mg daily for 3 weeks and now on aspirin 81mg  alone.   Cognitive Impairment and gait difficulty (since 2019; possible neurodegenerative vs NPH) Continue follow-up with Dr Jared [name] and [facility] neurologist as outpatient   Left CCA stenosis (likely asymptomatic) CT head and neck 60% stenosis in the distal left CCA secondary to plaque with ulceration Outpatient follow-up with vascular surgery   Hypertension Long term BP goal normotensive   Hyperlipidemia LDL 124, goal < 70 Continue Crestor 20    Orders Placed This Encounter  Procedures   Ambulatory referral to Vascular Surgery   Return for return to PCP.  I spent 45 minutes of face-to-face and non-face-to-face time with patient.  This included previsit chart review, lab review, study review, order entry, electronic health record documentation, patient education.     Diamond Martucci  R. Cylus Douville, MD 07/09/2023, 2:30 PM Certified in Neurology, Neurophysiology and Neuroimaging  [facility] [address], [address] [place], [place] [address] [phone]

## 2023-07-22 ENCOUNTER — Encounter: Payer: Medicare PPO | Attending: Physical Medicine & Rehabilitation | Admitting: Physical Medicine & Rehabilitation

## 2023-07-22 ENCOUNTER — Encounter: Payer: Self-pay | Admitting: Physical Medicine & Rehabilitation

## 2023-07-22 VITALS — BP 119/78 | HR 67 | Ht 70.0 in

## 2023-07-22 DIAGNOSIS — G629 Polyneuropathy, unspecified: Secondary | ICD-10-CM | POA: Diagnosis not present

## 2023-07-22 DIAGNOSIS — G622 Polyneuropathy due to other toxic agents: Secondary | ICD-10-CM | POA: Insufficient documentation

## 2023-07-22 NOTE — Progress Notes (Signed)
 Subjective:    Patient ID: Jared [name]., male    DOB: 04/30/1941, 83 y.o.   MRN: [id number] 83 y.o. 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  Per chart review lives with spouse.  Multilevel home.  Bed and bath on main level.  Uses electric wheelchair for mobility in the home furniture service and bathroom.  Rollator to get to the car.  He has a home health aide 2 times a week.  Presented 05/15/2023 with increasing confusion and garbled speech.  CT/MRI showed small acute/early subacute infarcts in the left parietal and right frontal occipital lobe.  CTA with no intracranial large vessel occlusion or significant stenosis.  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  Patient had an outpatient MRI at the [place] [id number] for ataxia and gait abnormality showed no acute abnormality at that time concern for possible NPH with prior lacunar infarct.  Underwent an LP to further evaluate for NPH.  Opening pressure was not in chart review labs unremarkable.  Patient had a neurology visit June 2024 with Dr. Vickey Huger for evaluation of gait difficulty.  Appointment note states that neuropathy started while on chemotherapy in 2019 walking difficulties in 2019 began with some falls in 2021.  Admission chemistries unremarkable.  Echocardiogram with ejection fraction of 70 to 75%.  Grade 1 diastolic dysfunction.  Neurology workup placed on low-dose aspirin 81 mg daily and Plavix 75 mg daily for CVA prophylaxis x 3 weeks then aspirin alone.  Lovenox added for DVT prophylaxis.  CVAs felt to be likely cardioembolic.  Patient did undergo loop recorder placed on 05/19/2023 for monitoring of atrial fibrillation per Dr. Nobie Putnam.  In regards to left CCA stenosis noted on CTA workup outpatient as  needed.   Admit date: 05/21/2023 Discharge date: 06/03/2023 HPI  Getting home health aide Mod I with dressing Standing in shower , occ using shower chair No falls since at home No walking in home except with PT, using walker  No falls  Discussed with both patient and wife his functional status prior to the stroke.  He gave an example of thinking back to Christmas which was prior to admission.  Both patient and his wife had difficulty remembering whether he was primarily using his scooter back then but she thinks probably yes. Prior to admission MRI of the brain on 04/14/2023 performed [facility] demonstrated extensive chronic small vessel disease and prior infarcts bilaterally with ex vacuo pattern dilated ventricles Patient also gives a history of having numbness after chemotherapy although EMG/NCV were reportedly negative Pain Inventory Average Pain 0 Pain Right Now 0 My pain is  no pain  LOCATION OF PAIN   no pain  BOWEL Number of stools per week: 7 Oral laxative use No    BLADDER Pads  Bladder incontinence Yes    Mobility walk with assistance use a walker ability to climb steps?  yes do you drive?  no use a wheelchair transfers alone Do you have any goals in this area?  yes  Function retired I need assistance with the following:  meal prep, household duties, and shopping Do you have any goals in this area?  yes  Neuro/Psych bladder control problems weakness numbness trouble walking confusion  Prior Studies Any changes since last visit?  no  Physicians involved in your care Any  changes since last visit?  no   Family History  Problem Relation Age of Onset   Other Mother        old age   Hodgkin's lymphoma Mother    Heart failure Father    Social History   Socioeconomic History   Marital status: Married    Spouse name: Not on file   Number of children: Not on file   Years of education: Not on file   Highest education level:  Not on file  Occupational History   Not on file  Tobacco Use   Smoking status: Never   Smokeless tobacco: Never  Vaping Use   Vaping status: Never Used  Substance and Sexual Activity   Alcohol use: Yes    Alcohol/week: 1.0 standard drink of alcohol    Types: 1 Glasses of wine per week   Drug use: No   Sexual activity: Not on file  Other Topics Concern   Not on file  Social History Narrative   Not on file   Social Drivers of Health   Financial Resource Strain: Not on file  Food Insecurity: No Food Insecurity (05/16/2023)   Hunger Vital Sign    Worried About Running Out of Food in the Last Year: Never true    Ran Out of Food in the Last Year: Never true  Transportation Needs: No Transportation Needs (05/16/2023)   PRAPARE - Administrator, Civil Service (Medical): No    Lack of Transportation (Non-Medical): No  Physical Activity: Not on file  Stress: Not on file  Social Connections: Moderately Integrated (05/16/2023)   Social Connection and Isolation Panel [NHANES]    Frequency of Communication with Friends and Family: Once a week    Frequency of Social Gatherings with Friends and Family: Three times a week    Attends Religious Services: More than 4 times per year    Active Member of Clubs or Organizations: No    Attends Banker Meetings: Never    Marital Status: Married   Past Surgical History:  Procedure Laterality Date   CATARACT EXTRACTION, BILATERAL     COLON SURGERY  2004   R hemicolectomy - removed due to cancer   COLONOSCOPY     HEMICOLECTOMY     HERNIA REPAIR  2005   LOOP RECORDER INSERTION N/A 05/19/2023   Procedure: LOOP RECORDER INSERTION;  Surgeon: Graciella Freer, PA-C;  Location: [facility];  Service: Cardiovascular;  Laterality: N/A;   REVERSE SHOULDER ARTHROPLASTY Left 11/29/2020   Procedure: REVERSE SHOULDER ARTHROPLASTY;  Surgeon: Bjorn Pippin, MD;  Location: [facility];  Service: Orthopedics;  Laterality: Left;    Past Medical History:  Diagnosis Date   colon cancer    History of kidney stones    Hypertension    Patient denies   Neuropathy due to chemotherapeutic drug [facility])    Neurologist says doesnt have it   Pre-diabetes    Ht 5\' 10"  (1.778 m)   BMI 24.01 kg/m   Opioid Risk Score:   Fall Risk Score:  `1  Depression screen [facility] 2/9     07/22/2023    2:29 PM 06/16/2023   11:15 AM  Depression screen PHQ 2/9  Decreased Interest 0 0  Down, Depressed, Hopeless 0 0  PHQ - 2 Score 0 0  Altered sleeping  0  Tired, decreased energy  0  Change in appetite  0  Feeling bad or failure about yourself   0  Trouble  concentrating  0  Moving slowly or fidgety/restless  0  Suicidal thoughts  0  PHQ-9 Score  0    Review of Systems  Musculoskeletal:  Positive for gait problem.  Neurological:  Positive for weakness and numbness.  Psychiatric/Behavioral:  Positive for confusion.   All other systems reviewed and are negative.      Objective:   Physical Exam  General No acute distress Mood and affect patient seems amused with visit No evidence of lability or agitation Speech without aphasia or dysarthria Motor strength is 5/5 bilateral deltoid bicep tricep grip 5/5 knee extensors 4/5 hip flexors 4/5 bilateral ankle dorsiflexors and plantar flexors There is no evidence of spasticity in the lower extremities negative clonus at the ankles Sensation intact proprioception bilaterally at the great toes there is reduced light touch sensation at the great toes as well as pinprick.  Patient inconsistent with pinprick on the other toes as well. There is no evidence of intrinsic atrophy in the feet No callus formation Skin shows no evidence of cyanosis or erythema no evidence of swelling in the joints of the foot and ankle region      Assessment & Plan:   #1.  Chronic neurologic gait abnormality likely chronic sensory neuropathy.  He has had problems with frequent falls and has even used a scooter  prior to his most recent stroke.  His MRI of the brain also showing extensive small vessel disease even prior to the most recent infarcts.  Patient has a poor awareness of his deficits which is likely related to his extensive peri Ventricular small vessel disease plus minus NPH.  I have discussed with the patient and his wife I do not think the most recent strokes in the left parietal and right frontal and occipital are not the cause of his gait abnormality.  Will finish up with home health therapy if he wishes to proceed with some outpatient I think that would be reasonable.  Patient will follow-up with neurology at the [facility].  I will see him back on an as needed basis

## 2023-07-22 NOTE — Patient Instructions (Signed)
 Call if you'd like orders for out patient PT or OT once Home health is finished

## 2023-07-28 ENCOUNTER — Ambulatory Visit (INDEPENDENT_AMBULATORY_CARE_PROVIDER_SITE_OTHER): Payer: Medicare PPO

## 2023-07-28 DIAGNOSIS — I639 Cerebral infarction, unspecified: Secondary | ICD-10-CM

## 2023-07-30 LAB — CUP PACEART REMOTE DEVICE CHECK
Date Time Interrogation Session: 20250325145700
Implantable Pulse Generator Implant Date: 20250113
Pulse Gen Serial Number: 127022

## 2023-08-20 ENCOUNTER — Other Ambulatory Visit: Payer: Self-pay | Admitting: Registered Nurse

## 2023-09-01 ENCOUNTER — Ambulatory Visit (INDEPENDENT_AMBULATORY_CARE_PROVIDER_SITE_OTHER): Payer: Medicare PPO

## 2023-09-01 DIAGNOSIS — I639 Cerebral infarction, unspecified: Secondary | ICD-10-CM | POA: Diagnosis not present

## 2023-09-02 DIAGNOSIS — D0461 Carcinoma in situ of skin of right upper limb, including shoulder: Secondary | ICD-10-CM | POA: Diagnosis not present

## 2023-09-02 DIAGNOSIS — L820 Inflamed seborrheic keratosis: Secondary | ICD-10-CM | POA: Diagnosis not present

## 2023-09-02 DIAGNOSIS — D485 Neoplasm of uncertain behavior of skin: Secondary | ICD-10-CM | POA: Diagnosis not present

## 2023-09-02 DIAGNOSIS — L570 Actinic keratosis: Secondary | ICD-10-CM | POA: Diagnosis not present

## 2023-09-02 DIAGNOSIS — L821 Other seborrheic keratosis: Secondary | ICD-10-CM | POA: Diagnosis not present

## 2023-09-02 DIAGNOSIS — Z85828 Personal history of other malignant neoplasm of skin: Secondary | ICD-10-CM | POA: Diagnosis not present

## 2023-09-02 DIAGNOSIS — D225 Melanocytic nevi of trunk: Secondary | ICD-10-CM | POA: Diagnosis not present

## 2023-09-02 DIAGNOSIS — L814 Other melanin hyperpigmentation: Secondary | ICD-10-CM | POA: Diagnosis not present

## 2023-09-04 LAB — CUP PACEART REMOTE DEVICE CHECK
Date Time Interrogation Session: 20250501060100
Implantable Pulse Generator Implant Date: 20250113
Pulse Gen Serial Number: 127022

## 2023-09-16 NOTE — Addendum Note (Signed)
 Addended by: Edra Govern D on: 09/16/2023 09:32 AM   Modules accepted: Orders

## 2023-09-16 NOTE — Progress Notes (Signed)
 Clarity Loop Recorder

## 2023-10-03 DIAGNOSIS — N390 Urinary tract infection, site not specified: Secondary | ICD-10-CM | POA: Diagnosis not present

## 2023-10-03 DIAGNOSIS — R300 Dysuria: Secondary | ICD-10-CM | POA: Diagnosis not present

## 2023-10-03 DIAGNOSIS — R81 Glycosuria: Secondary | ICD-10-CM | POA: Diagnosis not present

## 2023-10-03 DIAGNOSIS — Z8673 Personal history of transient ischemic attack (TIA), and cerebral infarction without residual deficits: Secondary | ICD-10-CM | POA: Diagnosis not present

## 2023-10-03 DIAGNOSIS — R319 Hematuria, unspecified: Secondary | ICD-10-CM | POA: Diagnosis not present

## 2023-10-03 DIAGNOSIS — R7301 Impaired fasting glucose: Secondary | ICD-10-CM | POA: Diagnosis not present

## 2023-10-03 DIAGNOSIS — Z85038 Personal history of other malignant neoplasm of large intestine: Secondary | ICD-10-CM | POA: Diagnosis not present

## 2023-10-03 DIAGNOSIS — N3941 Urge incontinence: Secondary | ICD-10-CM | POA: Diagnosis not present

## 2023-10-06 ENCOUNTER — Ambulatory Visit (INDEPENDENT_AMBULATORY_CARE_PROVIDER_SITE_OTHER): Payer: Medicare PPO

## 2023-10-06 DIAGNOSIS — I639 Cerebral infarction, unspecified: Secondary | ICD-10-CM

## 2023-10-06 LAB — CUP PACEART REMOTE DEVICE CHECK
Date Time Interrogation Session: 20250602070500
Implantable Pulse Generator Implant Date: 20250113
Pulse Gen Serial Number: 127022

## 2023-10-13 ENCOUNTER — Ambulatory Visit: Payer: Self-pay | Admitting: Cardiology

## 2023-10-22 NOTE — Progress Notes (Signed)
 Bsx Loop Recorder

## 2023-10-22 NOTE — Addendum Note (Signed)
 Addended by: Edra Govern D on: 10/22/2023 05:37 PM   Modules accepted: Orders

## 2023-11-06 ENCOUNTER — Ambulatory Visit (INDEPENDENT_AMBULATORY_CARE_PROVIDER_SITE_OTHER)

## 2023-11-06 DIAGNOSIS — I639 Cerebral infarction, unspecified: Secondary | ICD-10-CM

## 2023-11-06 LAB — CUP PACEART REMOTE DEVICE CHECK
Date Time Interrogation Session: 20250703030900
Implantable Pulse Generator Implant Date: 20250113
Pulse Gen Serial Number: 127022

## 2023-11-08 ENCOUNTER — Ambulatory Visit: Payer: Self-pay | Admitting: Cardiology

## 2023-11-25 NOTE — Progress Notes (Signed)
 Bsx Loop Recorder

## 2023-12-08 ENCOUNTER — Ambulatory Visit

## 2023-12-08 DIAGNOSIS — I639 Cerebral infarction, unspecified: Secondary | ICD-10-CM | POA: Diagnosis not present

## 2023-12-09 LAB — CUP PACEART REMOTE DEVICE CHECK
Date Time Interrogation Session: 20250805011300
Implantable Pulse Generator Implant Date: 20250113
Pulse Gen Serial Number: 127022

## 2023-12-14 ENCOUNTER — Ambulatory Visit: Payer: Self-pay | Admitting: Cardiology

## 2023-12-29 DIAGNOSIS — S41112A Laceration without foreign body of left upper arm, initial encounter: Secondary | ICD-10-CM | POA: Diagnosis not present

## 2023-12-29 DIAGNOSIS — W010XXA Fall on same level from slipping, tripping and stumbling without subsequent striking against object, initial encounter: Secondary | ICD-10-CM | POA: Diagnosis not present

## 2023-12-29 DIAGNOSIS — R2689 Other abnormalities of gait and mobility: Secondary | ICD-10-CM | POA: Diagnosis not present

## 2023-12-29 DIAGNOSIS — M7989 Other specified soft tissue disorders: Secondary | ICD-10-CM | POA: Diagnosis not present

## 2023-12-29 DIAGNOSIS — H05221 Edema of right orbit: Secondary | ICD-10-CM | POA: Diagnosis not present

## 2023-12-29 DIAGNOSIS — R0781 Pleurodynia: Secondary | ICD-10-CM | POA: Diagnosis not present

## 2023-12-29 DIAGNOSIS — R262 Difficulty in walking, not elsewhere classified: Secondary | ICD-10-CM | POA: Diagnosis not present

## 2023-12-29 DIAGNOSIS — M79642 Pain in left hand: Secondary | ICD-10-CM | POA: Diagnosis not present

## 2024-01-08 ENCOUNTER — Encounter

## 2024-01-12 ENCOUNTER — Ambulatory Visit

## 2024-01-12 DIAGNOSIS — I639 Cerebral infarction, unspecified: Secondary | ICD-10-CM

## 2024-01-14 LAB — CUP PACEART REMOTE DEVICE CHECK
Date Time Interrogation Session: 20250909231700
Implantable Pulse Generator Implant Date: 20250113
Pulse Gen Serial Number: 127022

## 2024-01-22 NOTE — Progress Notes (Signed)
 Remote Loop Recorder Transmission

## 2024-01-25 ENCOUNTER — Ambulatory Visit: Payer: Self-pay | Admitting: Cardiology

## 2024-02-02 NOTE — Progress Notes (Signed)
 Remote Loop Recorder Transmission

## 2024-02-04 NOTE — Progress Notes (Signed)
 [facility] Summary Report / Loop Recorder

## 2024-02-09 ENCOUNTER — Encounter

## 2024-02-12 ENCOUNTER — Encounter

## 2024-02-13 ENCOUNTER — Ambulatory Visit (INDEPENDENT_AMBULATORY_CARE_PROVIDER_SITE_OTHER)

## 2024-02-13 DIAGNOSIS — I639 Cerebral infarction, unspecified: Secondary | ICD-10-CM

## 2024-02-18 ENCOUNTER — Ambulatory Visit: Payer: Self-pay | Admitting: Cardiology

## 2024-02-18 LAB — CUP PACEART REMOTE DEVICE CHECK
Date Time Interrogation Session: 20251014110900
Implantable Pulse Generator Implant Date: 20250113
Pulse Gen Serial Number: 127022

## 2024-02-19 NOTE — Progress Notes (Signed)
 Remote Loop Recorder Transmission

## 2024-03-05 ENCOUNTER — Emergency Department (HOSPITAL_COMMUNITY)

## 2024-03-05 ENCOUNTER — Other Ambulatory Visit: Payer: Self-pay

## 2024-03-05 ENCOUNTER — Inpatient Hospital Stay (HOSPITAL_COMMUNITY)
Admission: EM | Admit: 2024-03-05 | Discharge: 2024-03-09 | DRG: 300 | Disposition: A | Discharge status: Skilled Nursing Facility | Attending: Neurology | Admitting: Neurology

## 2024-03-05 ENCOUNTER — Inpatient Hospital Stay (HOSPITAL_COMMUNITY)

## 2024-03-05 DIAGNOSIS — Z7984 Long term (current) use of oral hypoglycemic drugs: Secondary | ICD-10-CM

## 2024-03-05 DIAGNOSIS — Z96612 Presence of left artificial shoulder joint: Secondary | ICD-10-CM | POA: Diagnosis present

## 2024-03-05 DIAGNOSIS — G9340 Encephalopathy, unspecified: Secondary | ICD-10-CM | POA: Diagnosis present

## 2024-03-05 DIAGNOSIS — Z85038 Personal history of other malignant neoplasm of large intestine: Secondary | ICD-10-CM

## 2024-03-05 DIAGNOSIS — E785 Hyperlipidemia, unspecified: Secondary | ICD-10-CM | POA: Diagnosis present

## 2024-03-05 DIAGNOSIS — Z9181 History of falling: Secondary | ICD-10-CM

## 2024-03-05 DIAGNOSIS — Z7982 Long term (current) use of aspirin: Secondary | ICD-10-CM

## 2024-03-05 DIAGNOSIS — R29708 NIHSS score 8: Secondary | ICD-10-CM

## 2024-03-05 DIAGNOSIS — R2681 Unsteadiness on feet: Secondary | ICD-10-CM | POA: Diagnosis not present

## 2024-03-05 DIAGNOSIS — R569 Unspecified convulsions: Secondary | ICD-10-CM | POA: Diagnosis not present

## 2024-03-05 DIAGNOSIS — G629 Polyneuropathy, unspecified: Secondary | ICD-10-CM | POA: Diagnosis not present

## 2024-03-05 DIAGNOSIS — R4701 Aphasia: Secondary | ICD-10-CM | POA: Diagnosis not present

## 2024-03-05 DIAGNOSIS — Z8249 Family history of ischemic heart disease and other diseases of the circulatory system: Secondary | ICD-10-CM | POA: Diagnosis not present

## 2024-03-05 DIAGNOSIS — Z7902 Long term (current) use of antithrombotics/antiplatelets: Secondary | ICD-10-CM

## 2024-03-05 DIAGNOSIS — Z9841 Cataract extraction status, right eye: Secondary | ICD-10-CM

## 2024-03-05 DIAGNOSIS — R32 Unspecified urinary incontinence: Secondary | ICD-10-CM | POA: Diagnosis present

## 2024-03-05 DIAGNOSIS — Z807 Family history of other malignant neoplasms of lymphoid, hematopoietic and related tissues: Secondary | ICD-10-CM

## 2024-03-05 DIAGNOSIS — R1110 Vomiting, unspecified: Secondary | ICD-10-CM | POA: Diagnosis present

## 2024-03-05 DIAGNOSIS — R29810 Facial weakness: Secondary | ICD-10-CM | POA: Diagnosis present

## 2024-03-05 DIAGNOSIS — E119 Type 2 diabetes mellitus without complications: Secondary | ICD-10-CM | POA: Diagnosis not present

## 2024-03-05 DIAGNOSIS — Z9842 Cataract extraction status, left eye: Secondary | ICD-10-CM | POA: Diagnosis not present

## 2024-03-05 DIAGNOSIS — R269 Unspecified abnormalities of gait and mobility: Secondary | ICD-10-CM | POA: Diagnosis present

## 2024-03-05 DIAGNOSIS — R918 Other nonspecific abnormal finding of lung field: Secondary | ICD-10-CM | POA: Diagnosis not present

## 2024-03-05 DIAGNOSIS — R1310 Dysphagia, unspecified: Secondary | ICD-10-CM | POA: Diagnosis not present

## 2024-03-05 DIAGNOSIS — I639 Cerebral infarction, unspecified: Secondary | ICD-10-CM | POA: Diagnosis not present

## 2024-03-05 DIAGNOSIS — R930 Abnormal findings on diagnostic imaging of skull and head, not elsewhere classified: Secondary | ICD-10-CM | POA: Diagnosis not present

## 2024-03-05 DIAGNOSIS — R4182 Altered mental status, unspecified: Secondary | ICD-10-CM | POA: Diagnosis not present

## 2024-03-05 DIAGNOSIS — I6522 Occlusion and stenosis of left carotid artery: Secondary | ICD-10-CM | POA: Diagnosis not present

## 2024-03-05 DIAGNOSIS — G9389 Other specified disorders of brain: Secondary | ICD-10-CM | POA: Diagnosis present

## 2024-03-05 DIAGNOSIS — Z79899 Other long term (current) drug therapy: Secondary | ICD-10-CM | POA: Diagnosis not present

## 2024-03-05 DIAGNOSIS — R9089 Other abnormal findings on diagnostic imaging of central nervous system: Secondary | ICD-10-CM | POA: Diagnosis not present

## 2024-03-05 DIAGNOSIS — F05 Delirium due to known physiological condition: Secondary | ICD-10-CM | POA: Diagnosis not present

## 2024-03-05 DIAGNOSIS — R410 Disorientation, unspecified: Secondary | ICD-10-CM | POA: Diagnosis not present

## 2024-03-05 DIAGNOSIS — I69391 Dysphagia following cerebral infarction: Secondary | ICD-10-CM | POA: Diagnosis not present

## 2024-03-05 DIAGNOSIS — G3184 Mild cognitive impairment, so stated: Secondary | ICD-10-CM | POA: Diagnosis not present

## 2024-03-05 DIAGNOSIS — I10 Essential (primary) hypertension: Secondary | ICD-10-CM | POA: Diagnosis not present

## 2024-03-05 DIAGNOSIS — R29702 NIHSS score 2: Secondary | ICD-10-CM | POA: Diagnosis not present

## 2024-03-05 DIAGNOSIS — E1151 Type 2 diabetes mellitus with diabetic peripheral angiopathy without gangrene: Secondary | ICD-10-CM | POA: Diagnosis not present

## 2024-03-05 DIAGNOSIS — R29818 Other symptoms and signs involving the nervous system: Secondary | ICD-10-CM | POA: Diagnosis not present

## 2024-03-05 DIAGNOSIS — G919 Hydrocephalus, unspecified: Secondary | ICD-10-CM | POA: Diagnosis not present

## 2024-03-05 DIAGNOSIS — R0989 Other specified symptoms and signs involving the circulatory and respiratory systems: Secondary | ICD-10-CM | POA: Diagnosis not present

## 2024-03-05 DIAGNOSIS — I6523 Occlusion and stenosis of bilateral carotid arteries: Secondary | ICD-10-CM | POA: Diagnosis not present

## 2024-03-05 DIAGNOSIS — Z8673 Personal history of transient ischemic attack (TIA), and cerebral infarction without residual deficits: Secondary | ICD-10-CM | POA: Diagnosis not present

## 2024-03-05 DIAGNOSIS — I6782 Cerebral ischemia: Secondary | ICD-10-CM | POA: Diagnosis not present

## 2024-03-05 LAB — CBC
HCT: 42.4 % (ref 39.0–52.0)
Hemoglobin: 14 g/dL (ref 13.0–17.0)
MCH: 30.4 pg (ref 26.0–34.0)
MCHC: 33 g/dL (ref 30.0–36.0)
MCV: 92 fL (ref 80.0–100.0)
Platelets: 251 K/uL (ref 150–400)
RBC: 4.61 MIL/uL (ref 4.22–5.81)
RDW: 12.2 % (ref 11.5–15.5)
WBC: 6.9 K/uL (ref 4.0–10.5)
nRBC: 0 % (ref 0.0–0.2)

## 2024-03-05 LAB — DIFFERENTIAL
Abs Immature Granulocytes: 0.01 K/uL (ref 0.00–0.07)
Basophils Absolute: 0.1 K/uL (ref 0.0–0.1)
Basophils Relative: 1 %
Eosinophils Absolute: 0.4 K/uL (ref 0.0–0.5)
Eosinophils Relative: 6 %
Immature Granulocytes: 0 %
Lymphocytes Relative: 29 %
Lymphs Abs: 2 K/uL (ref 0.7–4.0)
Monocytes Absolute: 0.8 K/uL (ref 0.1–1.0)
Monocytes Relative: 12 %
Neutro Abs: 3.6 K/uL (ref 1.7–7.7)
Neutrophils Relative %: 52 %

## 2024-03-05 LAB — POCT I-STAT, CHEM 8
BUN: 16 mg/dL (ref 8–23)
Calcium, Ion: 1.12 mmol/L — ABNORMAL LOW (ref 1.15–1.40)
Chloride: 102 mmol/L (ref 98–111)
Creatinine, Ser: 0.8 mg/dL (ref 0.61–1.24)
Glucose, Bld: 132 mg/dL — ABNORMAL HIGH (ref 70–99)
HCT: 43 % (ref 39.0–52.0)
Hemoglobin: 14.6 g/dL (ref 13.0–17.0)
Potassium: 3.7 mmol/L (ref 3.5–5.1)
Sodium: 141 mmol/L (ref 135–145)
TCO2: 27 mmol/L (ref 22–32)

## 2024-03-05 LAB — MRSA NEXT GEN BY PCR, NASAL: MRSA by PCR Next Gen: NOT DETECTED

## 2024-03-05 LAB — COMPREHENSIVE METABOLIC PANEL WITH GFR
ALT: 22 U/L (ref 0–44)
AST: 25 U/L (ref 15–41)
Albumin: 3.6 g/dL (ref 3.5–5.0)
Alkaline Phosphatase: 80 U/L (ref 38–126)
Anion gap: 11 (ref 5–15)
BUN: 13 mg/dL (ref 8–23)
CO2: 26 mmol/L (ref 22–32)
Calcium: 8.9 mg/dL (ref 8.9–10.3)
Chloride: 102 mmol/L (ref 98–111)
Creatinine, Ser: 0.85 mg/dL (ref 0.61–1.24)
GFR, Estimated: >60 mL/min (ref >60)
Glucose, Bld: 138 mg/dL — ABNORMAL HIGH (ref 70–99)
Potassium: 3.7 mmol/L (ref 3.5–5.1)
Sodium: 139 mmol/L (ref 135–145)
Total Bilirubin: 0.6 mg/dL (ref 0.0–1.2)
Total Protein: 6.6 g/dL (ref 6.5–8.1)

## 2024-03-05 LAB — HEMOGLOBIN A1C
Hgb A1c MFr Bld: 6.2 % — ABNORMAL HIGH (ref 4.8–5.6)
Mean Plasma Glucose: 131.24 mg/dL

## 2024-03-05 LAB — ETHANOL: Alcohol, Ethyl (B): <15 mg/dL (ref <15)

## 2024-03-05 LAB — APTT: aPTT: 29 s (ref 24–36)

## 2024-03-05 LAB — PROTIME-INR
INR: 1 (ref 0.8–1.2)
Prothrombin Time: 14.2 s (ref 11.4–15.2)

## 2024-03-05 MED ORDER — PANTOPRAZOLE SODIUM 40 MG IV SOLR
40.0000 mg | Freq: Every day | INTRAVENOUS | Status: DC
  Administered 2024-03-05 – 2024-03-07 (×3): 40 mg via INTRAVENOUS
  Filled 2024-03-05 (×3): qty 10

## 2024-03-05 MED ORDER — ACETAMINOPHEN 650 MG RE SUPP: 650.0000 mg | RECTAL | Status: DC | PRN

## 2024-03-05 MED ORDER — ACETAMINOPHEN 325 MG PO TABS
650.0000 mg | ORAL_TABLET | ORAL | Status: DC | PRN
  Filled 2024-03-05: qty 2

## 2024-03-05 MED ORDER — SENNOSIDES-DOCUSATE SODIUM 8.6-50 MG PO TABS: 1.0000 | ORAL_TABLET | Freq: Every evening | ORAL | Status: DC | PRN

## 2024-03-05 MED ORDER — TENECTEPLASE FOR STROKE
0.2500 mg/kg | PACK | Freq: Once | INTRAVENOUS | Status: AC
  Administered 2024-03-05: 20 mg via INTRAVENOUS
  Filled 2024-03-05: qty 10

## 2024-03-05 MED ORDER — SODIUM CHLORIDE 0.9 % IV SOLN: INTRAVENOUS | Status: AC

## 2024-03-05 MED ORDER — ONDANSETRON HCL 4 MG/2ML IJ SOLN
4.0000 mg | Freq: Four times a day (QID) | INTRAMUSCULAR | Status: DC | PRN
  Administered 2024-03-05: 4 mg via INTRAVENOUS
  Filled 2024-03-05: qty 2

## 2024-03-05 MED ORDER — LABETALOL HCL 5 MG/ML IV SOLN
20.0000 mg | Freq: Once | INTRAVENOUS | Status: AC
  Administered 2024-03-05: 10 mg via INTRAVENOUS
  Filled 2024-03-05: qty 4

## 2024-03-05 MED ORDER — STROKE: EARLY STAGES OF RECOVERY BOOK: Freq: Once | Status: AC

## 2024-03-05 MED ORDER — ACETAMINOPHEN 160 MG/5ML PO SOLN: 650.0000 mg | ORAL | Status: DC | PRN

## 2024-03-05 MED ORDER — SODIUM CHLORIDE 0.9% FLUSH
3.0000 mL | Freq: Once | INTRAVENOUS | Status: AC
  Administered 2024-03-05: 3 mL via INTRAVENOUS

## 2024-03-05 MED ORDER — IOHEXOL 350 MG/ML SOLN
75.0000 mL | Freq: Once | INTRAVENOUS | Status: AC | PRN
  Administered 2024-03-05: 75 mL via INTRAVENOUS

## 2024-03-05 MED ORDER — CLEVIDIPINE BUTYRATE 0.5 MG/ML IV EMUL
0.0000 mg/h | INTRAVENOUS | Status: DC
  Administered 2024-03-05: 2 mg/h via INTRAVENOUS
  Filled 2024-03-05: qty 100

## 2024-03-05 NOTE — Code Documentation (Signed)
 Stroke Response Nurse Documentation Code Documentation  Jared [name]. is a 83 y.o. male arriving to [facility]  via [facility] on 03/05/2024 with past medical hx of prior CVA. On aspirin  81 mg daily. Code stroke was activated by EMS.   Patient from home where he was LKW at 1315 and now complaining of aphasia.    Stroke team at the bedside on patient arrival. Labs drawn and patient cleared for CT by Dr. Pamella. Patient to CT with team. NIHSS 9, see documentation for details and code stroke times. Patient with disoriented, not following commands, left gaze preference , right hemianopia, left facial droop, and Global aphasia  on exam. The following imaging was completed:  CT Head and CTA. Patient is a candidate for IV Thrombolytic due to fixed neurological deficit. Patient is not a candidate for IR due to LVO neg.   Care Plan:      TNK:  Q12min x 2 hours, q30min x 6 hours, q1h x 16 hours until 24 hour mark  BP < 180/105  Call Neuro for:  New Headache, worsening symptoms, bleeding, nausea/vomiting, or any signs of angioedema.  .     Bedside handoff with ED RN Rolin.    Jared [name]  Stroke Response RN

## 2024-03-05 NOTE — Progress Notes (Signed)
 Pt with new vomiting, stable NIH. Given recent TNK, will get stat head CT in addition to giving zofran .   Aisha Seals, MD [facility]   If 7pm- 7am, please page neurology on call as listed in AMION.

## 2024-03-05 NOTE — ED Provider Notes (Signed)
 [facility] NEURO/TRAUMA/SURGICAL ICU Provider Note   CSN: [id number] Arrival date & time: 03/05/24  1706  An emergency department physician performed an initial assessment on this suspected stroke patient at 18.  Patient presents with: Code Stroke   Jared [name]. is a 83 y.o. male.  With a history of ischemic stroke with residual right-sided weakness and hypertension who presents to the ED given concern for stroke.  Last known well time 1315 today per family.  Family noted patient to exhibit signs of global confusion and expressive aphasia around 1445 prompting them to call EMS.  Patient continues to have these deficits.  Code stroke activated and examined with neurology team.  Additional history limited secondary to clinical condition   HPI     Prior to Admission medications   Medication Sig Start Date End Date Taking? Authorizing Provider  acetaminophen  (TYLENOL ) 325 MG tablet Take 2 tablets (650 mg total) by mouth every 4 (four) hours as needed for mild pain (pain score 1-3) (or temp > 37.5 C (99.5 F)). 05/28/23   Angiulli, Toribio JINNY, PA-C  aspirin  EC 81 MG tablet Take 1 tablet (81 mg total) by mouth daily. Swallow whole. 05/22/23   Pahwani, Ravi, MD  empagliflozin (JARDIANCE) 25 MG TABS tablet Take 25 mg by mouth daily. 06/17/23   [provider]  metFORMIN (GLUCOPHAGE-XR) 500 MG 24 hr tablet Take 500 mg by mouth daily.    [provider]  polyethylene glycol (MIRALAX  / GLYCOLAX ) 17 g packet Take 17 g by mouth daily. 05/29/23   Angiulli, Toribio JINNY, PA-C  rosuvastatin  (CRESTOR ) 20 MG tablet Take 1 tablet (20 mg total) by mouth daily. 06/16/23 07/16/23  Debby Fidela CROME, NP  senna-docusate (SENOKOT-S) 8.6-50 MG tablet Take 1 tablet by mouth 2 (two) times daily. 05/28/23   Angiulli, Toribio JINNY, PA-C    Allergies: Patient has no known allergies.    Review of Systems  Updated Vital Signs BP 134/82   Pulse 83   Temp 97.9 F (36.6 C) (Oral)   Resp 19   Ht 5' 10  (1.778 m)   Wt 80 kg   SpO2 95%   BMI 25.31 kg/m   Physical Exam Vitals and nursing note reviewed.  HENT:     Head: Normocephalic and atraumatic.  Eyes:     Pupils: Pupils are equal, round, and reactive to light.  Cardiovascular:     Rate and Rhythm: Normal rate and regular rhythm.  Pulmonary:     Effort: Pulmonary effort is normal.     Breath sounds: Normal breath sounds.  Abdominal:     Palpations: Abdomen is soft.     Tenderness: There is no abdominal tenderness.  Skin:    General: Skin is warm and dry.  Neurological:     Mental Status: He is alert.     Comments: Left gaze preference does not cross midline.  Left facial asymmetry.  Sensation intact to light touch and symmetric.  Receptive aphasia.  Expressive aphasia  Psychiatric:        Mood and Affect: Mood normal.     (all labs ordered are listed, but only abnormal results are displayed) Labs Reviewed  COMPREHENSIVE METABOLIC PANEL WITH GFR - Abnormal; Notable for the following components:      Result Value   Glucose, Bld 138 (*)    All other components within normal limits  HEMOGLOBIN A1C - Abnormal; Notable for the following components:   Hgb A1c MFr Bld 6.2 (*)  All other components within normal limits  POCT I-STAT, CHEM 8 - Abnormal; Notable for the following components:   Glucose, Bld 132 (*)    Calcium , Ion 1.12 (*)    All other components within normal limits  MRSA NEXT GEN BY PCR, NASAL  PROTIME-INR  APTT  CBC  DIFFERENTIAL  ETHANOL  LIPID PANEL  URINALYSIS, ROUTINE W REFLEX MICROSCOPIC  I-STAT CHEM 8, ED  CBG MONITORING, ED    EKG: None  Radiology: CT ANGIO HEAD NECK W WO CM (CODE STROKE) Result Date: 03/05/2024 EXAM: CTA Head and Neck with Intravenous Contrast. CT Head without Contrast. CLINICAL HISTORY: Neuro deficit, acute, stroke suspected. TECHNIQUE: Axial CTA images of the head and neck performed with and without intravenous contrast. MIP reconstructed images were created and  reviewed. Axial computed tomography images of the head/brain performed without intravenous contrast. Note: Per PQRS, the description of internal carotid artery percent stenosis, including 0 percent or normal exam, is based on North American Symptomatic Carotid Endarterectomy Trial (NASCET) criteria. Dose reduction technique was used including one or more of the following: automated exposure control, adjustment of mA and kV according to patient size, and/or iterative reconstruction. CONTRAST: Without and with; 75 mL iohexol  (OMNIPAQUE ) 350 MG/ML injection. COMPARISON: None provided. FINDINGS: CT HEAD: BRAIN: No acute intraparenchymal hemorrhage. No mass lesion. No CT evidence for acute territorial infarct. No midline shift or extra-axial collection. VENTRICLES: No hydrocephalus. ORBITS: The orbits are unremarkable. SINUSES AND MASTOIDS: The paranasal sinuses and mastoid air cells are clear. CTA NECK: COMMON CAROTID ARTERIES: Mild atherosclerotic calcification of the right common carotid artery without hemodynamically significant stenosis. Mixed density atherosclerotic disease at the left carotid bifurcation extending into the left ICA resulting in 50% stenosis. The distal left ICA is widely patent. No dissection or occlusion. INTERNAL CAROTID ARTERIES: Mild atherosclerotic calcification of the right internal carotid artery without hemodynamically significant stenosis. Mixed density atherosclerotic disease at the left carotid bifurcation extending into the left ICA resulting in 50% stenosis. The distal left ICA is widely patent. No dissection or occlusion. VERTEBRAL ARTERIES: The vertebral arteries are right dominant. The left vertebral artery arises directly from the aortic arch. There is atherosclerotic calcification at the right vertebral artery origin without significant stenosis. No dissection or occlusion. CTA HEAD: ANTERIOR CEREBRAL ARTERIES: No significant stenosis. No occlusion. No aneurysm. MIDDLE CEREBRAL  ARTERIES: No significant stenosis. No occlusion. No aneurysm. POSTERIOR CEREBRAL ARTERIES: No significant stenosis. No occlusion. No aneurysm. BASILAR ARTERY: No significant stenosis. No occlusion. No aneurysm. OTHER: SOFT TISSUES: No acute finding. No masses or lymphadenopathy. BONES: No acute osseous abnormality. IMPRESSION: 1. No emergent large vessel occlusion. 2. Mixed density atherosclerotic disease at the left carotid bifurcation extending into the left ICA resulting in 50% stenosis; the distal ICA is widely patent. 3. Mild atherosclerotic calcification of the right common and internal carotid arteries without hemodynamically significant stenosis. 4. Atherosclerotic calcification at the right vertebral artery origin without significant stenosis. Electronically signed by: Franky Stanford MD 03/05/2024 06:01 PM EDT RP Workstation: [id number]   DG CHEST PORT 1 VIEW Result Date: 03/05/2024 EXAM: 1 VIEW(S) XRAY OF THE CHEST 03/05/2024 05:45:00 PM COMPARISON: 03/11/2024 CLINICAL HISTORY: [id number] Altered mental status [id number] Altered mental status FINDINGS: LINES, TUBES AND DEVICES: Loop recorder projects over the left chest unchanged. LUNGS AND PLEURA: Low lung volumes. Bibasilar airspace opacities, left greater than right, favor atelectasis or scarring similar to prior study. No pulmonary edema. No pleural effusion. No pneumothorax. HEART AND MEDIASTINUM: No acute abnormality of the cardiac  and mediastinal silhouettes. BONES AND SOFT TISSUES: Prior left shoulder replacement. IMPRESSION: 1. Low lung volumes. 2. Bibasilar airspace opacities, left greater than right, favored to represent atelectasis or scarring, similar to the prior study. Electronically signed by: Franky Crease MD 03/05/2024 05:49 PM EDT RP Workstation: [id number]   CT HEAD CODE STROKE WO CONTRAST Result Date: 03/05/2024 EXAM: CT HEAD WITHOUT CONTRAST 03/05/2024 05:09:09 PM TECHNIQUE: CT of the head was performed without the administration of  intravenous contrast. Automated exposure control, iterative reconstruction, and/or weight based adjustment of the mA/kV was utilized to reduce the radiation dose to as low as reasonably achievable. COMPARISON: 05/15/2023 CLINICAL HISTORY: Neuro deficit, acute, stroke suspected. FINDINGS: BRAIN AND VENTRICLES: No acute hemorrhage. No evidence of acute infarct. Generalized volume loss with diffuse chronic ischemic white matter changes. No hydrocephalus. No extra-axial collection. No mass effect or midline shift. [place] Stroke Program Early CT Score (ASPECTS) Ganglionic (Caudate, IC, Lentiform Nucleus, Insula, M1-M3): 7 Supraganglionic (M4-M6): 3 Total: 10 ORBITS: No acute abnormality. SINUSES: No acute abnormality. SOFT TISSUES AND SKULL: No acute soft tissue abnormality. No skull fracture. Bilateral small volume mastoid fluid. IMPRESSION: 1. No acute intracranial abnormality. 2. Generalized volume loss with diffuse chronic ischemic white matter changes. 3. Bilateral small volume mastoid fluid. 4. ASPECTS: 10. Findings communicated to Dr. Eligio Lav at 5:13 pm on 03/05/2024. Electronically signed by: Franky Stanford MD 03/05/2024 05:13 PM EDT RP Workstation: [id number]     .Critical Care  Performed by: Pamella Ozell LABOR, DO Authorized by: Pamella Ozell LABOR, DO   Critical care provider statement:    Critical care time (minutes):  40   Critical care was necessary to treat or prevent imminent or life-threatening deterioration of the following conditions:  CNS failure or compromise   Critical care was time spent personally by me on the following activities:  Development of treatment plan with patient or surrogate, discussions with consultants, evaluation of patient's response to treatment, examination of patient, ordering and review of laboratory studies, ordering and review of radiographic studies, ordering and performing treatments and interventions, pulse oximetry, re-evaluation of patient's condition and  review of old charts   I assumed direction of critical care for this patient from another provider in my specialty: no     Care discussed with: admitting provider   Comments:     Care discussed with neurology team Dr. Arora    Medications Ordered in the ED   stroke: early stages of recovery book (has no administration in time range)  0.9 %  sodium chloride  infusion ( Intravenous New Bag/Given 03/05/24 1832)  acetaminophen  (TYLENOL ) tablet 650 mg (has no administration in time range)    Or  acetaminophen  (TYLENOL ) 160 MG/5ML solution 650 mg (has no administration in time range)    Or  acetaminophen  (TYLENOL ) suppository 650 mg (has no administration in time range)  senna-docusate (Senokot-S) tablet 1 tablet (has no administration in time range)  pantoprazole (PROTONIX) injection 40 mg (has no administration in time range)  labetalol (NORMODYNE) injection 20 mg (10 mg Intravenous Given 03/05/24 1749)    And  clevidipine (CLEVIPREX) infusion 0.5 mg/mL (8 mg/hr Intravenous Rate/Dose Change 03/05/24 1842)  ondansetron  (ZOFRAN ) injection 4 mg (4 mg Intravenous Given 03/05/24 2007)  sodium chloride  flush (NS) 0.9 % injection 3 mL (3 mLs Intravenous Given 03/05/24 1843)  tenecteplase (TNKASE) injection for Stroke 20 mg (20 mg Intravenous Given 03/05/24 1716)  iohexol  (OMNIPAQUE ) 350 MG/ML injection 75 mL (75 mLs Intravenous Contrast Given 03/05/24 1744)  Clinical Course as of 03/05/24 2011  Fri Mar 05, 2024  1709 No evidence of acute bleed on CT head without contrast.  Dr.Arora has discussed plan for thrombolytics with family who are in agreement after reviewing the risks and benefits. [MP]  1716 TNK administered.  Will complete MRI, stroke workup and admit to ICU.  Cleviprex as needed for blood pressure control [MP]    Clinical Course User Index [MP] Pamella Ozell LABOR, DO                                 Medical Decision Making 83 year old male with history as above presented to the ED  given concern for acute stroke.  Code stroke activated and examined with Dr.Arora upon arrival.  New onset symptoms of left-sided gaze preference, expressive aphasia receptive aphasia.  Last known well time of 1315.  First noted to have deficits around 1445.  NIH stroke scale score of 8 as detailed in neurology note.  No evidence of bleeding on CT head.  Dr.Arora has discussed risks and benefits with the patient's family member over the phone and tenecteplase has been administered.  Good response to Cleviprex for blood pressure control.  Subsequently admitted to neuro ICU in stable condition  Amount and/or Complexity of Data Reviewed Labs: ordered. Radiology: ordered.  Risk Decision regarding hospitalization.        Final diagnoses:  Acute ischemic stroke [facility])    ED Discharge Orders     None          Pamella Ozell LABOR, DO 03/05/24 2011

## 2024-03-05 NOTE — ED Triage Notes (Signed)
 EMS reports they were called to pts home because family noted that he was having word salad and then stopped talking a short time later. Pt has had previous strokes and has generalized right sided weakness and uses a wheelchair to get around. Pt last known well was 1315.  BP 168/90 P 70 CBG 114 O2 98% on RA 2 18g IV left and right AC

## 2024-03-05 NOTE — H&P (Addendum)
 NEUROLOGY H&P NOTE   Date of service: March 05, 2024 Patient Name: Jared Jared [name]. MRN:  [id number] DOB:  1941/03/17 Chief Complaint: Code Stroke  History of Present Illness  Jared Jared [name]. is a 83 y.o. male with hx of stroke, HTN, neuropathy, left carotid stenosis, gait dysfunction with wheelchair use, diabetes, history of colon cancer presenting with an acute onset of altered mental status. Last known well 1315.  He was in his usual state of health this afternoon and then family found him sitting in his wheelchair altered at 1445.  They state he is typically alert and oriented x 4.  He does have mobility issues from previous strokes, however his discharge NIH was a 0. Most recent stroke workup done in January 2025.  He was found to have a small acute/early subacute infarcts in the right occipital and frontal lobe as well as his left parietal lobe thought to be cardioembolic.  He had a loop recorder placed at this time and was discharged to CIR. He currently takes ASA 81mg . He does follow with Dr. Margaret at [facility].  He has also undergone an NPH workup due to his gait dysfunction and incontinence.  Last known well: 1315 Modified rankin score: 4-Needs assistance to walk and tend to bodily needs IV Thrombolysis: Yes, consent obtained by Rock Redge (wife) over the phone. CTH personally reviewed prior to TNK administration.  Risks benefits and alternatives discussed prior to TNK administration.  Blood pressure was within goal.  Glucose 132.  Thrombectomy: No, mRs precludes NIR  NIHSS components Score: Comment  1a Level of Conscious 0[]  1[]  2[]  3[]      1b LOC Questions 0[]  1[]  2[x]       1c LOC Commands 0[]  1[x]  2[]       2 Best Gaze 0[]  1[x]  2[]     Left gaze  3 Visual 0[]  1[x]  2[]  3[]    Right hemianopia   4 Facial Palsy 0[]  1[x]  2[]  3[]      5a Motor Arm - left 0[x]  1[]  2[]  3[]  4[]  UN[]    5b Motor Arm - Right 0[x]  1[]  2[]  3[]  4[]  UN[]    6a Motor Leg - Left 0[x]  1[]  2[]  3[]  4[]  UN[]    6b  Motor Leg - Right 0[x]  1[]  2[]  3[]  4[]  UN[]    7 Limb Ataxia 0[x]  1[]  2[]  UN[]      8 Sensory 0[x]  1[]  2[]  UN[]      9 Best Language 0[]  1[]  2[x]  3[]      10 Dysarthria 0[x]  1[]  2[]  UN[]      11 Extinct. and Inattention 0[x]  1[]  2[]       TOTAL:       ROS   Unable to perform due to altered mental status   Past History   Past Medical History:  Diagnosis Date   colon cancer    History of kidney stones    Hypertension    Patient denies   Neuropathy due to chemotherapeutic drug    Neurologist says doesnt have it   Pre-diabetes    Past Surgical History:  Procedure Laterality Date   CATARACT EXTRACTION, BILATERAL     COLON SURGERY  2004   R hemicolectomy - removed due to cancer   COLONOSCOPY     HEMICOLECTOMY     HERNIA REPAIR  2005   LOOP RECORDER INSERTION N/A 05/19/2023   Procedure: LOOP RECORDER INSERTION;  Surgeon: Jared [name] Barter, PA-C;  Location: [facility];  Service: Cardiovascular;  Laterality: N/A;   REVERSE SHOULDER ARTHROPLASTY Left  11/29/2020   Procedure: REVERSE SHOULDER ARTHROPLASTY;  Surgeon: Jared Bonner DASEN, MD;  Location: [facility];  Service: Orthopedics;  Laterality: Left;   Family History  Problem Relation Age of Onset   Other Mother        old age   Hodgkin's lymphoma Mother    Heart failure Father    Social History   Socioeconomic History   Marital status: Married    Spouse name: Not on file   Number of children: Not on file   Years of education: Not on file   Highest education level: Not on file  Occupational History   Not on file  Tobacco Use   Smoking status: Never   Smokeless tobacco: Never  Vaping Use   Vaping status: Never Used  Substance and Sexual Activity   Alcohol  use: Yes    Alcohol /week: 1.0 standard drink of alcohol     Types: 1 Glasses of wine per week   Drug use: No   Sexual activity: Not on file  Other Topics Concern   Not on file  Social History Narrative   Not on file   Social Drivers of Health   Financial  Resource Strain: Not on file  Food Insecurity: No Food Insecurity (05/16/2023)   Hunger Vital Sign    Worried About Running Out of Food in the Last Year: Never true    Ran Out of Food in the Last Year: Never true  Transportation Needs: No Transportation Needs (05/16/2023)   PRAPARE - Administrator, Civil Service (Medical): No    Lack of Transportation (Non-Medical): No  Physical Activity: Not on file  Stress: Not on file  Social Connections: Moderately Integrated (05/16/2023)   Social Connection and Isolation Panel    Frequency of Communication with Friends and Family: Once a week    Frequency of Social Gatherings with Friends and Family: Three times a week    Attends Religious Services: More than 4 times per year    Active Member of Clubs or Organizations: No    Attends Banker Meetings: Never    Marital Status: Married   No Known Allergies  Medications   Current Facility-Administered Medications:    sodium chloride  flush (NS) 0.9 % injection 3 mL, 3 mL, Intravenous, Once, Jared Jared [name] LABOR, DO  Current Outpatient Medications:    acetaminophen  (TYLENOL ) 325 MG tablet, Take 2 tablets (650 mg total) by mouth every 4 (four) hours as needed for mild pain (pain score 1-3) (or temp > 37.5 C (99.5 F))., Disp: , Rfl:    aspirin  EC 81 MG tablet, Take 1 tablet (81 mg total) by mouth daily. Swallow whole., Disp: 30 tablet, Rfl: 0   empagliflozin (JARDIANCE) 25 MG TABS tablet, Take 25 mg by mouth daily., Disp: , Rfl:    polyethylene glycol (MIRALAX  / GLYCOLAX ) 17 g packet, Take 17 g by mouth daily., Disp: , Rfl:    rosuvastatin  (CRESTOR ) 20 MG tablet, Take 1 tablet (20 mg total) by mouth daily., Disp: 30 tablet, Rfl: 0   senna-docusate (SENOKOT-S) 8.6-50 MG tablet, Take 1 tablet by mouth 2 (two) times daily., Disp: , Rfl:    Vitals   Vitals:   03/05/24 1700  Weight: 80 kg  Height: 5' 10 (1.778 m)     Body mass index is 25.31 kg/m.  Physical Exam    Constitutional: Elderly, no acute distress Psych: Affect appropriate to situation.  Eyes: No scleral injection.  HENT: No OP obstruction.  Jared [name]: Normocephalic.  Cardiovascular: Normal rate and regular rhythm.  Respiratory: Effort normal, non-labored breathing.  GI: Soft.  No distension. There is no tenderness.  Skin: WDI.   Neurologic Examination    Neuro: Mental Status: Patient is awake, alert, states name, unable to answer other orientation questions Able to follow some one-step commands with reinforcement Cranial Nerves: II: Pupils are equal, round, and reactive to light.  Does not consistently blink to threat on the right III,IV, VI: Appears to have a left gaze preference but does cross midline V: Facial sensation is symmetric to temperature VII: Some left facial asymmetry VIII: Hearing is intact to voice X: Palate elevates symmetrically XI: Shoulder shrug is symmetric. XII: Tongue protrudes midline without atrophy or fasciculations.  Motor: Tone is normal. Bulk is normal.  No drift noted in extremities.  Elevates all extremities antigravity Sensory: Sensation is symmetric to light touch and temperature in the arms and legs.  Cerebellar: Unable to complete FNF or HKS due to receptive aphasia.  But no gross ataxia noted on exam  Labs   CBC:  Recent Labs  Lab 03/05/24 1706  HGB 14.6  HCT 43.0   Basic Metabolic Panel:  Lab Results  Component Value Date   NA 141 03/05/2024   K 3.7 03/05/2024   CO2 25 05/26/2023   GLUCOSE 132 (H) 03/05/2024   BUN 16 03/05/2024   CREATININE 0.80 03/05/2024   CALCIUM  8.9 05/26/2023   GFRNONAA >60 05/26/2023   GFRAA 95 02/08/2020   Lipid Panel:  Lab Results  Component Value Date   LDLCALC 124 (H) 05/15/2023   HgbA1c:  Lab Results  Component Value Date   HGBA1C 6.0 (H) 05/15/2023   CT Jared [name] without contrast(Personally reviewed): 1. No acute intracranial abnormality. 2. Generalized volume loss with diffuse chronic  ischemic white matter changes. 3. Bilateral small volume mastoid fluid. 4. ASPECTS: 10.  CT angio Jared [name] and Neck with contrast(Personally reviewed): No elvo  Assessment   Jared Jared [name]. is a 83 y.o. male hx of stroke, HTN, neuropathy, left carotid stenosis, gait dysfunction with wheelchair use, diabetes, history of colon cancer presenting with an acute onset of altered mental status.  Last known well 1315.  He was in his usual state of health this afternoon and then family found him sitting in his wheelchair altered at 1445. On exam he has receptive and expressive aphasia, difficulty following multi step commands, possible field cut and gaze preference. He was given TNK after discussion with his wife with whom risks, benefits and alternatives were discussed. CT imaging reviewed by attending prior to TNK admin. Not a candidate for thrombectomy even if he had ELVO due to poor baseline modified Rankin.  IMPRESSION Acute ischemic stroke  Recommendations  - Recommend loop recorder interrogation (message sent to cards master) - HgbA1c, fasting lipid panel - MRI of the brain without contrast in 24 hours - Frequent neuro checks - Echocardiogram - Prophylactic therapy-Antiplatelet med: - Risk factor modification - Telemetry monitoring - PT consult, OT consult, Speech consult - Stroke team to follow - Encephalopathy/infectious work up including chest xray and UA  Full code SCD for DVT prophylaxis Stroke team to follow ______________________________________________________________________   Signed, Jorene Last, NP [facility]     Attending Neurohospitalist Addendum Patient seen and examined with APP/Resident. Agree with the history and physical as documented above. Agree with the plan as documented, which I helped formulate. I have independently reviewed the chart, obtained history, review of systems and examined the patient.I have personally reviewed  pertinent  Jared [name]/neck/spine imaging (CT/MRI). RISKS, BENEFITS AND ALTERNATIVES OF TNK D/W WIFE CT Jared [name] REVIEWED PERSONALLY AND NEGATIVE FOR BLEED (REVIEWED BEFORE TNK)  Please feel free to call with any questions.  -- Eligio Lav, MD Neurologist [facility] Pager: [phone]  CRITICAL CARE ATTESTATION Performed by: Eligio Lav, MD Total critical care time: 55 minutes Critical care time was exclusive of separately billable procedures and treating other patients and/or supervising APPs/Residents/Students Critical care was necessary to treat or prevent imminent or life-threatening deterioration. This patient is critically ill and at significant risk for neurological worsening and/or death and care requires constant monitoring. Critical care was time spent personally by me on the following activities: development of treatment plan with patient and/or surrogate as well as nursing, discussions with consultants, evaluation of patient's response to treatment, examination of patient, obtaining history from patient or surrogate, ordering and performing treatments and interventions, ordering and review of laboratory studies, ordering and review of radiographic studies, pulse oximetry, re-evaluation of patient's condition, participation in multidisciplinary rounds and medical decision making of high complexity in the care of this patient.

## 2024-03-05 NOTE — Progress Notes (Signed)
 PHARMACIST CODE STROKE RESPONSE  Notified to mix TNK at 17:13 by Dr. Voncile TNK preparation completed at 17:16.   TNK dose = 20 mg IV over 5 seconds.   Issues/delays encountered (if applicable): None  Maurilio Patten, PharmD PGY1 Pharmacy Resident [facility] 03/05/2024 5:19 PM

## 2024-03-06 ENCOUNTER — Inpatient Hospital Stay (HOSPITAL_COMMUNITY)

## 2024-03-06 DIAGNOSIS — G9389 Other specified disorders of brain: Secondary | ICD-10-CM | POA: Diagnosis not present

## 2024-03-06 DIAGNOSIS — I639 Cerebral infarction, unspecified: Secondary | ICD-10-CM | POA: Diagnosis not present

## 2024-03-06 DIAGNOSIS — I69391 Dysphagia following cerebral infarction: Secondary | ICD-10-CM

## 2024-03-06 DIAGNOSIS — G9340 Encephalopathy, unspecified: Principal | ICD-10-CM

## 2024-03-06 DIAGNOSIS — R29702 NIHSS score 2: Secondary | ICD-10-CM | POA: Diagnosis not present

## 2024-03-06 DIAGNOSIS — R569 Unspecified convulsions: Secondary | ICD-10-CM

## 2024-03-06 DIAGNOSIS — R4182 Altered mental status, unspecified: Secondary | ICD-10-CM | POA: Diagnosis not present

## 2024-03-06 DIAGNOSIS — R9089 Other abnormal findings on diagnostic imaging of central nervous system: Secondary | ICD-10-CM | POA: Diagnosis not present

## 2024-03-06 DIAGNOSIS — Z7984 Long term (current) use of oral hypoglycemic drugs: Secondary | ICD-10-CM

## 2024-03-06 DIAGNOSIS — E1151 Type 2 diabetes mellitus with diabetic peripheral angiopathy without gangrene: Secondary | ICD-10-CM

## 2024-03-06 DIAGNOSIS — R29818 Other symptoms and signs involving the nervous system: Secondary | ICD-10-CM | POA: Diagnosis not present

## 2024-03-06 DIAGNOSIS — Z8673 Personal history of transient ischemic attack (TIA), and cerebral infarction without residual deficits: Secondary | ICD-10-CM | POA: Diagnosis not present

## 2024-03-06 LAB — URINALYSIS, ROUTINE W REFLEX MICROSCOPIC
Bacteria, UA: NONE SEEN
Bilirubin Urine: NEGATIVE
Glucose, UA: NEGATIVE mg/dL
Ketones, ur: NEGATIVE mg/dL
Leukocytes,Ua: NEGATIVE
Nitrite: NEGATIVE
Protein, ur: NEGATIVE mg/dL
Specific Gravity, Urine: 1.01 (ref 1.005–1.030)
pH: 8 (ref 5.0–8.0)

## 2024-03-06 LAB — LIPID PANEL
Cholesterol: 97 mg/dL (ref 0–200)
HDL: 42 mg/dL (ref >40)
LDL Cholesterol: 45 mg/dL (ref 0–99)
Total CHOL/HDL Ratio: 2.3 ratio
Triglycerides: 49 mg/dL (ref <150)
VLDL: 10 mg/dL (ref 0–40)

## 2024-03-06 MED ORDER — CHLORHEXIDINE GLUCONATE CLOTH 2 % EX PADS
6.0000 | MEDICATED_PAD | Freq: Every day | CUTANEOUS | Status: DC
  Administered 2024-03-06 – 2024-03-09 (×4): 6 via TOPICAL

## 2024-03-06 MED ORDER — HALOPERIDOL LACTATE 5 MG/ML IJ SOLN
2.0000 mg | Freq: Once | INTRAMUSCULAR | Status: AC
  Administered 2024-03-06: 2 mg via INTRAVENOUS
  Filled 2024-03-06: qty 1

## 2024-03-06 MED ORDER — LORAZEPAM 2 MG/ML IJ SOLN
1.0000 mg | Freq: Once | INTRAMUSCULAR | Status: AC
  Administered 2024-03-06: 1 mg via INTRAVENOUS
  Filled 2024-03-06: qty 1

## 2024-03-06 MED ORDER — ROSUVASTATIN CALCIUM 20 MG PO TABS
20.0000 mg | ORAL_TABLET | Freq: Every day | ORAL | Status: DC
  Administered 2024-03-07 – 2024-03-09 (×3): 20 mg via ORAL
  Filled 2024-03-06 (×3): qty 1

## 2024-03-06 NOTE — Progress Notes (Signed)
 EEG completed, results pending.

## 2024-03-06 NOTE — Progress Notes (Signed)
 SLP Cancellation Note  Patient Details Name: Jared [name]. MRN: [id number] DOB: Jul 07, 1940   Cancelled treatment:       Reason Eval/Treat Not Completed: Patient at procedure or test/unavailable  Swallow and cognitive/linguistic evaluation were attempted, however, patient was unavailable as he was having an EEG.  ST will continue efforts.    Eleanor Eagles, MA, CCC-SLP Acute Rehab SLP [phone]  Eleanor LOISE Eagles 03/06/2024, 9:59 AM

## 2024-03-06 NOTE — Evaluation (Signed)
 Clinical/Bedside Swallow Evaluation Patient Details  Name: Jared [name]. MRN: [id number] Date of Birth: [birth year]  Today's Date: 03/06/2024 Time: SLP Start Time (ACUTE ONLY): 1312 SLP Stop Time (ACUTE ONLY): 1327 SLP Time Calculation (min) (ACUTE ONLY): 15 min  Past Medical History:  Past Medical History:  Diagnosis Date   colon cancer    History of kidney stones    Hypertension    Patient denies   Neuropathy due to chemotherapeutic drug    Neurologist says doesnt have it   Pre-diabetes    Past Surgical History:  Past Surgical History:  Procedure Laterality Date   CATARACT EXTRACTION, BILATERAL     COLON SURGERY  2004   R hemicolectomy - removed due to cancer   COLONOSCOPY     HEMICOLECTOMY     HERNIA REPAIR  2005   LOOP RECORDER INSERTION N/A 05/19/2023   Procedure: LOOP RECORDER INSERTION;  Surgeon: Lesia Ozell Barter, PA-C;  Location: [facility];  Service: Cardiovascular;  Laterality: N/A;   REVERSE SHOULDER ARTHROPLASTY Left 11/29/2020   Procedure: REVERSE SHOULDER ARTHROPLASTY;  Surgeon: Cristy Bonner DASEN, MD;  Location: [facility];  Service: Orthopedics;  Laterality: Left;   HPI:  83 yo male with history of stroke, neuropathy, L carotid stenosis, gait dysfunction with wheelchair use, DM, history of colon cancer who is presenting with acute onset of AMS. S/p TNK 10/31. CTH negative, MRI pending. CXR with stable bibasilar (L>R) atelectasis. Pt followed for cognition during admission in January 2025 for acute/early subacute L parietal and R frontal/occipital stroke.    Assessment / Plan / Recommendation  Clinical Impression  Pt is alert but agitated, which RN reports has been fluctuating throughout the day. He is oriented to self but not to time or place. Pt followed all simple commands to complete an oral motor exam, revealing mild L CN VII weakness. When given cueing and set up assistance, he fed himself purees and solids, initiating prompt oral transit. Delayed  throat clearance followed sips of thin liquids intermittently but this did not recur after completing the 3 oz water  test. Will start with Dys 3 solids and thin liquids. Given mentation, he will require full supervision for all PO intake and SLP will f/u as able for ongoing assessment of swallowing and cognitive-linguistic function. SLP Visit Diagnosis: Dysphagia, unspecified (R13.10)    Aspiration Risk  Mild aspiration risk    Diet Recommendation Dysphagia 3 (Mech soft);Thin liquid    Liquid Administration via: Cup;Straw Medication Administration: Whole meds with puree Supervision: Staff to assist with self feeding;Full supervision/cueing for compensatory strategies Compensations: Minimize environmental distractions;Slow rate;Small sips/bites Postural Changes: Seated upright at 90 degrees    Other  Recommendations Oral Care Recommendations: Oral care BID     Assistance Recommended at Discharge    Functional Status Assessment Patient has had a recent decline in their functional status and demonstrates the ability to make significant improvements in function in a reasonable and predictable amount of time.  Frequency and Duration min 2x/week  2 weeks       Prognosis Prognosis for improved oropharyngeal function: Good Barriers to Reach Goals: Cognitive deficits;Language deficits      Swallow Study   General HPI: 83 yo male with history of stroke, neuropathy, L carotid stenosis, gait dysfunction with wheelchair use, DM, history of colon cancer who is presenting with acute onset of AMS. S/p TNK 10/31. CTH negative, MRI pending. CXR with stable bibasilar (L>R) atelectasis. Pt followed for cognition during admission in January  2025 for acute/early subacute L parietal and R frontal/occipital stroke. Type of Study: Bedside Swallow Evaluation Previous Swallow Assessment: see HPI Diet Prior to this Study: NPO Temperature Spikes Noted: No Respiratory Status: Room air History of Recent  Intubation: No Behavior/Cognition: Alert;Cooperative;Impulsive;Requires cueing Oral Cavity Assessment: Within Functional Limits Oral Care Completed by SLP: No Oral Cavity - Dentition: Adequate natural dentition Vision: Functional for self-feeding Self-Feeding Abilities: Able to feed self Patient Positioning: Upright in bed Baseline Vocal Quality: Normal Volitional Cough: Strong Volitional Swallow: Able to elicit    Oral/Motor/Sensory Function Overall Oral Motor/Sensory Function: Mild impairment Facial ROM: Reduced left;Suspected CN VII (facial) dysfunction Facial Symmetry: Abnormal symmetry left;Suspected CN VII (facial) dysfunction   Ice Chips Ice chips: Not tested   Thin Liquid Thin Liquid: Impaired Presentation: Straw Pharyngeal  Phase Impairments: Throat Clearing - Delayed    Nectar Thick Nectar Thick Liquid: Not tested   Honey Thick Honey Thick Liquid: Not tested   Puree Puree: Within functional limits Presentation: Spoon   Solid     Solid: Within functional limits      Jared [name], M.A., CCC-SLP Speech Language Pathology, Acute Rehabilitation Services  Secure Chat preferred [phone]  03/06/2024,2:08 PM

## 2024-03-06 NOTE — Progress Notes (Signed)
 OT Cancellation Note  Patient Details Name: Jared [name]. MRN: [id number] DOB: Dec 26, 1940   Cancelled Treatment:    Reason Eval/Treat Not Completed: Active bedrest order  Makya Phillis K, OTD, OTR/L SecureChat Preferred Acute Rehab [phone]   Laneta POUR Koonce 03/06/2024, 7:01 AM

## 2024-03-06 NOTE — Progress Notes (Addendum)
 STROKE TEAM PROGRESS NOTE    SIGNIFICANT HOSPITAL EVENTS 10/3 1 patient presented with acute onset of altered mental status.  He was given IV TNK for NIH 8  INTERIM HISTORY/SUBJECTIVE He was admitted to the ICU overnight.  MRI brain scheduled for 5 PM tonight which will also be his 24 hours scan. He has a loop and that has been interrogated which revealed no irregular rhythms Will check EEG to evaluate for seizures.  Will also check UA  There is no family at the bedside.  He is restraint with bilateral wrists and mittens on.  He does follow commands and can answer some questions can repeat some simple sentences does have some aphasia, but can answer and state his name, his wife's name, son's name, and the president I met the patient's wife in the afternoon.  She informed me patient does have significant cognitive impairment at baseline and was being evaluated for NPH will diagnostic testing was not definitive.  He was also admitted in January with multiple embolic infarcts of cryptogenic etiology.  Loop recorder so far has been negative for A-fib.  Patient has not been ambulating at baseline uses a wheelchair or scooter since his recent strokes. CBC    Component Value Date/Time   WBC 6.9 03/05/2024 1704   RBC 4.61 03/05/2024 1704   HGB 14.6 03/05/2024 1706   HGB 13.8 07/16/2021 1519   HGB 14.8 11/19/2007 1307   HCT 43.0 03/05/2024 1706   HCT 40.4 07/16/2021 1519   HCT 42.7 11/19/2007 1307   PLT 251 03/05/2024 1704   PLT 285 07/16/2021 1519   MCV 92.0 03/05/2024 1704   MCV 91 07/16/2021 1519   MCV 88.8 11/19/2007 1307   MCH 30.4 03/05/2024 1704   MCHC 33.0 03/05/2024 1704   RDW 12.2 03/05/2024 1704   RDW 11.8 07/16/2021 1519   RDW 12.1 11/19/2007 1307   LYMPHSABS 2.0 03/05/2024 1704   LYMPHSABS 2.0 07/16/2021 1519   LYMPHSABS 2.1 11/19/2007 1307   MONOABS 0.8 03/05/2024 1704   MONOABS 0.7 11/19/2007 1307   EOSABS 0.4 03/05/2024 1704   EOSABS 0.2 07/16/2021 1519   BASOSABS 0.1  03/05/2024 1704   BASOSABS 0.0 07/16/2021 1519   BASOSABS 0.0 11/19/2007 1307    BMET    Component Value Date/Time   NA 141 03/05/2024 1706   NA 141 07/16/2021 1519   K 3.7 03/05/2024 1706   CL 102 03/05/2024 1706   CO2 26 03/05/2024 1704   GLUCOSE 132 (H) 03/05/2024 1706   BUN 16 03/05/2024 1706   BUN 20 07/16/2021 1519   CREATININE 0.80 03/05/2024 1706   CALCIUM  8.9 03/05/2024 1704   EGFR 92 07/16/2021 1519   GFRNONAA >60 03/05/2024 1704    IMAGING past 24 hours CT HEAD WO CONTRAST ( ) Result Date: 03/05/2024 CLINICAL DATA:  Code stroke. Initial evaluation for acute neuro deficit, stroke suspected. EXAM: CT HEAD WITHOUT CONTRAST TECHNIQUE: Contiguous axial images were obtained from the base of the skull through the vertex without intravenous contrast. RADIATION DOSE REDUCTION: This exam was performed according to the departmental dose-optimization program which includes automated exposure control, adjustment of the mA and/or kV according to patient size and/or use of iterative reconstruction technique. COMPARISON:  Comparison made with CTs from earlier the same day. FINDINGS: Brain: Atrophy with advanced chronic microvascular ischemic disease, stable. No acute intracranial hemorrhage. No acute large vessel territory infarct. No mass lesion or midline shift. Ventricular prominence related global parenchymal volume loss of hydrocephalus, stable. No extra-axial  fluid collection. Vascular: Some IV contrast material remains on board from prior CTA. Calcified atherosclerosis present at skull base. Skull: Scalp soft tissues within normal limits.  Calvarium intact. Sinuses/Orbits: Left gaze preference noted. Paranasal sinuses remain largely clear. Small right mastoid effusion. Other: None. ASPECTS [facility] Stroke Program Early CT Score) - Ganglionic level infarction (caudate, lentiform nuclei, internal capsule, insula, M1-M3 cortex): 7 - Supraganglionic infarction (M4-M6 cortex): 3 Total score  (0-10 with 10 being normal): 10 IMPRESSION: 1. Stable head CT. No acute intracranial abnormality. 2. Aspects is 10. 3. Atrophy with advanced chronic microvascular ischemic disease. These results were communicated to Dr. Michaela at 8:49 pm on 03/05/2024 by text page via the  [facility] messaging system. Electronically Signed   By: Morene Hoard M.D.   On: 03/05/2024 20:49   CT ANGIO HEAD NECK W WO CM (CODE STROKE) Result Date: 03/05/2024 EXAM: CTA Head and Neck with Intravenous Contrast. CT Head without Contrast. CLINICAL HISTORY: Neuro deficit, acute, stroke suspected. TECHNIQUE: Axial CTA images of the head and neck performed with and without intravenous contrast. MIP reconstructed images were created and reviewed. Axial computed tomography images of the head/brain performed without intravenous contrast. Note: Per PQRS, the description of internal carotid artery percent stenosis, including 0 percent or normal exam, is based on [employer] American Symptomatic Carotid Endarterectomy Trial (NASCET) criteria. Dose reduction technique was used including one or more of the following: automated exposure control, adjustment of mA and kV according to patient size, and/or iterative reconstruction. CONTRAST: Without and with; 75 mL iohexol  (OMNIPAQUE ) 350 MG/ML injection. COMPARISON: None provided. FINDINGS: CT HEAD: BRAIN: No acute intraparenchymal hemorrhage. No mass lesion. No CT evidence for acute territorial infarct. No midline shift or extra-axial collection. VENTRICLES: No hydrocephalus. ORBITS: The orbits are unremarkable. SINUSES AND MASTOIDS: The paranasal sinuses and mastoid air cells are clear. CTA NECK: COMMON CAROTID ARTERIES: Mild atherosclerotic calcification of the right common carotid artery without hemodynamically significant stenosis. Mixed density atherosclerotic disease at the left carotid bifurcation extending into the left ICA resulting in 50% stenosis. The distal left ICA is widely patent. No  dissection or occlusion. INTERNAL CAROTID ARTERIES: Mild atherosclerotic calcification of the right internal carotid artery without hemodynamically significant stenosis. Mixed density atherosclerotic disease at the left carotid bifurcation extending into the left ICA resulting in 50% stenosis. The distal left ICA is widely patent. No dissection or occlusion. VERTEBRAL ARTERIES: The vertebral arteries are right dominant. The left vertebral artery arises directly from the aortic arch. There is atherosclerotic calcification at the right vertebral artery origin without significant stenosis. No dissection or occlusion. CTA HEAD: ANTERIOR CEREBRAL ARTERIES: No significant stenosis. No occlusion. No aneurysm. MIDDLE CEREBRAL ARTERIES: No significant stenosis. No occlusion. No aneurysm. POSTERIOR CEREBRAL ARTERIES: No significant stenosis. No occlusion. No aneurysm. BASILAR ARTERY: No significant stenosis. No occlusion. No aneurysm. OTHER: SOFT TISSUES: No acute finding. No masses or lymphadenopathy. BONES: No acute osseous abnormality. IMPRESSION: 1. No emergent large vessel occlusion. 2. Mixed density atherosclerotic disease at the left carotid bifurcation extending into the left ICA resulting in 50% stenosis; the distal ICA is widely patent. 3. Mild atherosclerotic calcification of the right common and internal carotid arteries without hemodynamically significant stenosis. 4. Atherosclerotic calcification at the right vertebral artery origin without significant stenosis. Electronically signed by: Franky Stanford MD 03/05/2024 06:01 PM EDT RP Workstation: [id number]   DG CHEST PORT 1 VIEW Result Date: 03/05/2024 EXAM: 1 VIEW(S) XRAY OF THE CHEST 03/05/2024 05:45:00 PM COMPARISON: 03/11/2024 CLINICAL HISTORY: [id number] Altered mental status [id number]  Altered mental status FINDINGS: LINES, TUBES AND DEVICES: Loop recorder projects over the left chest unchanged. LUNGS AND PLEURA: Low lung volumes. Bibasilar airspace opacities,  left greater than right, favor atelectasis or scarring similar to prior study. No pulmonary edema. No pleural effusion. No pneumothorax. HEART AND MEDIASTINUM: No acute abnormality of the cardiac and mediastinal silhouettes. BONES AND SOFT TISSUES: Prior left shoulder replacement. IMPRESSION: 1. Low lung volumes. 2. Bibasilar airspace opacities, left greater than right, favored to represent atelectasis or scarring, similar to the prior study. Electronically signed by: Franky Crease MD 03/05/2024 05:49 PM EDT RP Workstation: [id number]   CT HEAD CODE STROKE WO CONTRAST Result Date: 03/05/2024 EXAM: CT HEAD WITHOUT CONTRAST 03/05/2024 05:09:09 PM TECHNIQUE: CT of the head was performed without the administration of intravenous contrast. Automated exposure control, iterative reconstruction, and/or weight based adjustment of the mA/kV was utilized to reduce the radiation dose to as low as reasonably achievable. COMPARISON: 05/15/2023 CLINICAL HISTORY: Neuro deficit, acute, stroke suspected. FINDINGS: BRAIN AND VENTRICLES: No acute hemorrhage. No evidence of acute infarct. Generalized volume loss with diffuse chronic ischemic white matter changes. No hydrocephalus. No extra-axial collection. No mass effect or midline shift. [place] Stroke Program Early CT Score (ASPECTS) Ganglionic (Caudate, IC, Lentiform Nucleus, Insula, M1-M3): 7 Supraganglionic (M4-M6): 3 Total: 10 ORBITS: No acute abnormality. SINUSES: No acute abnormality. SOFT TISSUES AND SKULL: No acute soft tissue abnormality. No skull fracture. Bilateral small volume mastoid fluid. IMPRESSION: 1. No acute intracranial abnormality. 2. Generalized volume loss with diffuse chronic ischemic white matter changes. 3. Bilateral small volume mastoid fluid. 4. ASPECTS: 10. Findings communicated to Dr. Eligio Lav at 5:13 pm on 03/05/2024. Electronically signed by: Franky Stanford MD 03/05/2024 05:13 PM EDT RP Workstation: [id number]    Vitals:   03/06/24 0430  03/06/24 0500 03/06/24 0600 03/06/24 0700  BP: 132/81 121/72 (!) 147/82 (!) 138/117  Pulse: 76 71 (!) 103 (!) 104  Resp: 16 18 20  (!) 21  Temp:      TempSrc:      SpO2: 96% 97% 96% 96%  Weight:      Height:         PHYSICAL EXAM General:  Alert, well-nourished, well-developed elderly Caucasian male in no acute distress Psych:  Mood and affect appropriate for situation CV: Regular rate and rhythm on monitor Respiratory:  Regular, unlabored respirations on room air GI: Abdomen soft and nontender   NEURO:  Mental Status: He is awake and alert, able to state his name, wife's name, son's name, who the president of [place]  is.  He is confused with some expressive aphasia.  Can speak in simple sentences and repeat simple sentences and follow commands.  Disoriented to place and person and time.  Follows only simple midline and few one-step commands.  Cranial Nerves:  II: PERRL. Visual fields full.  III, IV, VI: EOMI. Eyelids elevate symmetrically.  V: Sensation is intact to light touch and symmetrical to face.  VII: Face is symmetrical resting and smiling VIII: hearing intact to voice. IX, X: Palate elevates symmetrically. Phonation is normal.  KP:Dynloizm shrug 5/5. XII: tongue is midline without fasciculations. Motor: 5/5 strength to all muscle groups tested.  Tone: is normal and bulk is normal Sensation- Intact to light touch bilaterally. Extinction absent to light touch to DSS.   Coordination: FTN intact bilaterally, HKS: no ataxia in BLE.No drift.  Gait- deferred  Most Recent NIH 2   ASSESSMENT/PLAN  Mr. Matan Steen. is a 83 y.o. male with  history of stroke, HTN, neuropathy, left carotid stenosis, gait dysfunction with wheelchair use, diabetes, history of colon cancer presenting with an acute onset of altered mental status   NIH on Admission 8  Acute ischemic stroke versus seizure versus encephalopathy treated with IV TNK for left facial droop and increased  confusion Code Stroke  CT head No acute abnormality.  Small vessel disease. Atrophy.  ASPECTS 10.     CTA head & neck No LVO  Repeat CT head stable  MRI scheduled for tonight around 5 PM 2D Echo ordered Loop interrogated and negative for any arrhythmia Routine EEG This study is suggestive of cortical dysfunction arising from  left hemisphere likely secondary to underlying structural abnormality. Additionally there is generalized cerebral dysfunction (encephalopathy). No seizures or epileptiform discharges were seen throughout the recording.  UA negative LDL 45 HgbA1c 6.2 VTE prophylaxis -SCDs aspirin  81 mg daily prior to admission, now on No antithrombotic for 24 hours post TNK and 24-hour brain imaging negative for hemorrhage will start aspirin  and Plavix  Therapy recommendations:  Pending Disposition: Pending  Hypertension Home meds: None Stable Blood Pressure Goal: BP less than 180/105   Hyperlipidemia Home meds: Crestor  20 mg,  resumed in hospital LDL 45, goal < 70 Continue statin at discharge  Diabetes type II Controlled Home meds: Metformin HgbA1c 6.2, goal < 7.0 CBGs SSI Recommend close follow-up with PCP for better DM control  Dysphagia Patient has post-stroke dysphagia, SLP consulted    Diet   Diet NPO time specified   Advance diet as tolerated  Hospital day # 1   Karna Geralds DNP, ACNPC-AG  [facility]  I have personally obtained history,examined this patient, reviewed notes, independently viewed imaging studies, participated in medical decision making and plan of care.ROS completed by me personally and pertinent positives fully documented  I have made any additions or clarifications directly to the above note. Agree with note above.  Patient presented with sudden onset of altered mental status and left facial droop and received IV TNK for presumed stroke.  He does have cognitive impairment at baseline and is nonambulatory following recent strokes in  January 2025 which were felt to be cryptogenic during or A-fib was found yet.  Continue close neurological observation and strict blood pressure control as per post TNK protocol.  Physical Occupational Therapy consults.  Interrogate loop recorder for A-fib.  Check 24-hour post TNK imaging.  Long discussion with wife about his prognosis, evaluation and treatment plan and answered questions. This patient is critically ill and at significant risk of neurological worsening, death and care requires constant monitoring of vital signs, hemodynamics,respiratory and cardiac monitoring, extensive review of multiple databases, frequent neurological assessment, discussion with family, other specialists and medical decision making of high complexity.I have made any additions or clarifications directly to the above note.This critical care time does not reflect procedure time, or teaching time or supervisory time of PA/NP/Med Resident etc but could involve care discussion time.  I spent 50 minutes of neurocritical care time  in the care of  this patient.     Eather Popp, MD Medical Director [facility] Pager: [phone] 03/06/2024 4:35 PM   To contact Stroke Continuity provider, please refer to [email or web address]. After hours, contact General Neurology

## 2024-03-06 NOTE — Procedures (Signed)
 Patient Name: Jared [name].  MRN: [id number]  Epilepsy Attending: Arlin MALVA Krebs  Referring Physician/Provider: Waddell Karna LABOR, NP  Date: 03/06/2024  Duration: 27.45 mins  Patient history: 83yo m with ams. EEG to evaluate for seizure  Level of alertness: Awake  AEDs during EEG study: None  Technical aspects: This EEG study was done with scalp electrodes positioned according to the 10-20 International system of electrode placement. Electrical activity was reviewed with band pass filter of 1-70Hz , sensitivity of 7 uV/mm, display speed of 81mm/sec with a 60Hz  notched filter applied as appropriate. EEG data were recorded continuously and digitally stored.  Video monitoring was available and reviewed as appropriate.  Description: The posterior dominant rhythm consists of 8 Hz activity of moderate voltage (25-35 uV) seen predominantly in posterior head regions, symmetric and reactive to eye opening and eye closing. EEG showed continuous 3 to 6 Hz theta-delta slowing in left hemisphere. Intermittent generalized 3-6hz  theta-delta slowing was also noted. Hyperventilation and photic stimulation were not performed.     ABNORMALITY - Intermittent slow, generalized - Continuous slow, left hemisphere  IMPRESSION: This study is suggestive of cortical dysfunction arising from  left hemisphere likely secondary to underlying structural abnormality. Additionally there is generalized cerebral dysfunction (encephalopathy). No seizures or epileptiform discharges were seen throughout the recording.  Carl Bleecker O Lorree Millar

## 2024-03-06 NOTE — Plan of Care (Signed)
 Progressing toward goals.

## 2024-03-06 NOTE — Progress Notes (Signed)
 PT Cancellation Note  Patient Details Name: Jahrell Hamor. MRN: [id number] DOB: [birth year]   Cancelled Treatment:    Reason Eval/Treat Not Completed: (P) Active bedrest order. Pt receiving TNK at 17:16 on 10/31. Will plan to hold off on PT today and follow-up tomorrow as able. Coordinated with RN to notify PT if bedrest orders are discontinued early.   Theo Ferretti, PT, DPT Acute Rehabilitation Services  Office: [phone]    Theo CHRISTELLA Ferretti 03/06/2024, 8:07 AM

## 2024-03-07 ENCOUNTER — Inpatient Hospital Stay (HOSPITAL_COMMUNITY)

## 2024-03-07 DIAGNOSIS — R410 Disorientation, unspecified: Secondary | ICD-10-CM | POA: Diagnosis not present

## 2024-03-07 DIAGNOSIS — I10 Essential (primary) hypertension: Secondary | ICD-10-CM | POA: Diagnosis not present

## 2024-03-07 DIAGNOSIS — E1151 Type 2 diabetes mellitus with diabetic peripheral angiopathy without gangrene: Secondary | ICD-10-CM | POA: Diagnosis not present

## 2024-03-07 DIAGNOSIS — R29810 Facial weakness: Secondary | ICD-10-CM | POA: Diagnosis not present

## 2024-03-07 DIAGNOSIS — I69391 Dysphagia following cerebral infarction: Secondary | ICD-10-CM | POA: Diagnosis not present

## 2024-03-07 DIAGNOSIS — Z7982 Long term (current) use of aspirin: Secondary | ICD-10-CM

## 2024-03-07 LAB — ECHOCARDIOGRAM COMPLETE
Area-P 1/2: 4.06 cm2
Height: 70 in
S' Lateral: 2.5 cm
Weight: 2821.89 [oz_av]

## 2024-03-07 MED ORDER — ASPIRIN 81 MG PO TBEC
81.0000 mg | DELAYED_RELEASE_TABLET | Freq: Every day | ORAL | Status: DC
Start: 2024-03-07 — End: 2024-03-28
  Administered 2024-03-07 – 2024-03-09 (×3): 81 mg via ORAL
  Filled 2024-03-07 (×3): qty 1

## 2024-03-07 MED ORDER — SODIUM CHLORIDE 0.9 % IV SOLN
25.0000 mg | Freq: Once | INTRAVENOUS | Status: AC
  Administered 2024-03-07: 25 mg via INTRAVENOUS
  Filled 2024-03-07: qty 1

## 2024-03-07 MED ORDER — CLOPIDOGREL BISULFATE 75 MG PO TABS
75.0000 mg | ORAL_TABLET | Freq: Every day | ORAL | Status: DC
  Administered 2024-03-07 – 2024-03-09 (×3): 75 mg via ORAL
  Filled 2024-03-07 (×3): qty 1

## 2024-03-07 MED ORDER — ENSURE PLUS HIGH PROTEIN PO LIQD
237.0000 mL | Freq: Two times a day (BID) | ORAL | Status: DC
  Administered 2024-03-08 – 2024-03-09 (×4): 237 mL via ORAL

## 2024-03-07 NOTE — Progress Notes (Signed)
  Echocardiogram 2D Echocardiogram has been performed.  Jared [name] 03/07/2024, 9:32 AM

## 2024-03-07 NOTE — Evaluation (Signed)
 Physical Therapy Evaluation Patient Details Name: Jared [name]. MRN: [id number] DOB: 1940/09/30 Today's Date: 03/07/2024  History of Present Illness  Pt is an 83 y.o. male who presented 03/05/24 with AMS, L facial droop, and aphasia. Pt received TNK. MRI brain negative for acute intracranial abnormality. PMHx: HTN, cognitive impairment, remote colon cancer with prior right hemicolectomy, CVA, neuropathy, left carotid stenosis, gait dysfunction with wheelchair use, pre-diabetes   Clinical Impression  Pt presents with condition above and deficits mentioned below, see PT Problem List. Per pt, PTA he was mod I using a bil platform RW for mobility in the house and a power w/c for community mobility. He lives with his wife in a multi-level house with 4-5 STE. He can stay on the main level of the house. He currently displays deficits in cognition (unsure of baseline), expressive communication, balance, activity tolerance, and generalized functional strength. He is at high risk for falls, currently needing minA for bed mobility and transfers and min-modA to ambulate very slowly with poor feet clearance and step length up to ~20 ft while using a RW. Considering the brain MRI was negative, I am hopeful the pt will quickly improve. Thus, if pt progresses as anticipated then would recommend family support at d/c and HHPT follow-up. If his family cannot provide the level of care he needs and he does not progress as anticipated then pt may benefit from short-term inpatient rehab, <  3 hours/day. He does not appear to have a medical diagnosis to qualify for AIR at this time. Will continue to follow acutely.      If plan is discharge home, recommend the following: A lot of help with walking and/or transfers;A little help with bathing/dressing/bathroom;Assistance with cooking/housework;Direct supervision/assist for medications management;Direct supervision/assist for financial management;Assist for  transportation;Help with stairs or ramp for entrance;Supervision due to cognitive status   Can travel by private vehicle        Equipment Recommendations BSC/3in1  Recommendations for Other Services       Functional Status Assessment Patient has had a recent decline in their functional status and demonstrates the ability to make significant improvements in function in a reasonable and predictable amount of time.     Precautions / Restrictions Precautions Precautions: Fall Restrictions Weight Bearing Restrictions Per Provider Order: No      Mobility  Bed Mobility Overal bed mobility: Needs Assistance Bed Mobility: Supine to Sit     Supine to sit: Min assist, HOB elevated, Used rails     General bed mobility comments: MinA needed to elevate trunk and gain balance sitting EOB    Transfers Overall transfer level: Needs assistance Equipment used: Rolling walker (2 wheels) Transfers: Sit to/from Stand Sit to Stand: Min assist           General transfer comment: MinA needed to power up to stand and gain balance from EOB to RW    Ambulation/Gait Ambulation/Gait assistance: Mod assist, Min assist Gait Distance (Feet): 20 Feet Assistive device: Rolling walker (2 wheels) Gait Pattern/deviations: Step-to pattern, Decreased step length - right, Decreased step length - left, Decreased stride length, Decreased dorsiflexion - right, Decreased dorsiflexion - left, Shuffle, Trunk flexed Gait velocity: reduced Gait velocity interpretation: <1.31 ft/sec, indicative of household ambulator   General Gait Details: Pt takes very slow, small, shuffling steps, needing max multi-modal cues to clear feet and take longer steps, often needing modA to shift weight and lift and advance contralateral leg. Poor carryover noted. Min-modA for balance throughout  Stairs            Wheelchair Mobility     Tilt Bed    Modified Rankin (Stroke Patients Only) Modified Rankin (Stroke  Patients Only) Pre-Morbid Rankin Score: Moderate disability Modified Rankin: Moderately severe disability     Balance Overall balance assessment: Needs assistance Sitting-balance support: No upper extremity supported, Bilateral upper extremity supported, Feet supported Sitting balance-Leahy Scale: Poor Sitting balance - Comments: Initially pt relied on his UEs to sit statically, displaying LOB posteriorly when his arms would lift off the bed, needing assistance to recover. As time in sitting progressed he was able to lift his arms without LOB eventually, CGA for safety   Standing balance support: Bilateral upper extremity supported, Reliant on assistive device for balance, During functional activity Standing balance-Leahy Scale: Poor Standing balance comment: reliant on RW and external physical assistance                             Pertinent Vitals/Pain Pain Assessment Pain Assessment: Faces Faces Pain Scale: No hurt Pain Intervention(s): Monitored during session    Home Living Family/patient expects to be discharged to:: Private residence Living Arrangements: Spouse/significant other Available Help at Discharge: Family;Available 24 hours/day;Personal care attendant (wife has bad back so cannot help much, son and his family live nearby and can assist PRN; PCA comes 1x per week for 5 hours to fix meals) Type of Home: House Home Access: Stairs to enter Entrance Stairs-Rails: Left Entrance Stairs-Number of Steps: 4-5   Home Layout: Multi-level;Able to live on main level with bedroom/bathroom (other floors are basement and attic he does not need to access) Home Equipment: Rolling Walker (2 wheels);Rollator (4 wheels);Grab bars - tub/shower;Hand held shower head;Shower seat;Wheelchair - [employer] - power      Prior Function Prior Level of Function : Independent/Modified Independent             Mobility Comments: Uses bil platform RW inside home, walking up to  ~50 ft at a time. Uses electric w/c in community. x1 fall in past 6 months ADLs Comments: normally bathes himself but reports sometimes needs assist for lower body dressing     Extremity/Trunk Assessment   Upper Extremity Assessment Upper Extremity Assessment: Defer to OT evaluation    Lower Extremity Assessment Lower Extremity Assessment: Generalized weakness (noted functionally, MMT scores of >/= 4+ grossly bil; reports numbness/tingling in bil legs)       Communication   Communication Communication: Impaired Factors Affecting Communication: Difficulty expressing self;Reduced clarity of speech    Cognition Arousal: Alert Behavior During Therapy: WFL for tasks assessed/performed   PT - Cognitive impairments: Difficult to assess, No family/caregiver present to determine baseline Difficult to assess due to: Impaired communication                     PT - Cognition Comments: Pt with impaired expressive communication. Follows simple multi-modal cues with delay. Delayed response to questions as well. Decreased carryover of cues to correct gait and needs cues to sequence mobility. Following commands: Impaired Following commands impaired: Follows one step commands inconsistently, Follows one step commands with increased time     Cueing Cueing Techniques: Verbal cues, Tactile cues     General Comments General comments (skin integrity, edema, etc.): VSS    Exercises     Assessment/Plan    PT Assessment Patient needs continued PT services  PT Problem List Decreased strength;Decreased activity tolerance;Decreased balance;Decreased mobility;Decreased  coordination;Decreased cognition;Decreased knowledge of use of DME;Decreased safety awareness       PT Treatment Interventions DME instruction;Gait training;Stair training;Functional mobility training;Therapeutic activities;Therapeutic exercise;Balance training;Neuromuscular re-education;Patient/family education;Cognitive  remediation;Wheelchair mobility training    PT Goals (Current goals can be found in the Care Plan section)  Acute Rehab PT Goals Patient Stated Goal: to improve PT Goal Formulation: With patient Time For Goal Achievement: 03/21/24 Potential to Achieve Goals: Good    Frequency Min 3X/week     Co-evaluation               AM-PAC PT 6 Clicks Mobility  Outcome Measure Help needed turning from your back to your side while in a flat bed without using bedrails?: A Little Help needed moving from lying on your back to sitting on the side of a flat bed without using bedrails?: A Little Help needed moving to and from a bed to a chair (including a wheelchair)?: A Lot Help needed standing up from a chair using your arms (e.g., wheelchair or bedside chair)?: A Little Help needed to walk in hospital room?: A Lot Help needed climbing 3-5 steps with a railing? : Total 6 Click Score: 14    End of Session Equipment Utilized During Treatment: Gait belt Activity Tolerance: Patient tolerated treatment well Patient left: in chair;with call bell/phone within reach;with chair alarm set Nurse Communication: Mobility status PT Visit Diagnosis: Unsteadiness on feet (R26.81);Other abnormalities of gait and mobility (R26.89);Muscle weakness (generalized) (M62.81);Difficulty in walking, not elsewhere classified (R26.2)    Time: 9063-8988 PT Time Calculation (min) (ACUTE ONLY): 35 min   Charges:   PT Evaluation $PT Eval Moderate Complexity: 1 Mod PT Treatments $Gait Training: 8-22 mins PT General Charges $$ ACUTE PT VISIT: 1 Visit         Theo Ferretti, PT, DPT Acute Rehabilitation Services  Office: [phone]   Theo CHRISTELLA Ferretti 03/07/2024, 1:30 PM

## 2024-03-07 NOTE — Progress Notes (Signed)
 STROKE TEAM PROGRESS NOTE    SIGNIFICANT HOSPITAL EVENTS 10/3 1 patient presented with acute onset of altered mental status.  He was given IV TNK for NIH 8  INTERIM HISTORY/SUBJECTIVE No family at the bedside this a.m. during rounds.  Patient lying comfortably in bed.  He is pleasant awake alert and cooperative.  He is disoriented.  Able to move all 4 extremities without focal weakness.  Blood pressure has been adequately controlled.  MRI scan of the brain shows no acute infarct.  EEG shows diffuse generalized slowing.  Echocardiogram shows EF of 60 to 65%.  Mild left ventricular hypertrophy.  Left atrial size is normal. CBC    Component Value Date/Time   WBC 6.9 03/05/2024 1704   RBC 4.61 03/05/2024 1704   HGB 14.6 03/05/2024 1706   HGB 13.8 07/16/2021 1519   HGB 14.8 11/19/2007 1307   HCT 43.0 03/05/2024 1706   HCT 40.4 07/16/2021 1519   HCT 42.7 11/19/2007 1307   PLT 251 03/05/2024 1704   PLT 285 07/16/2021 1519   MCV 92.0 03/05/2024 1704   MCV 91 07/16/2021 1519   MCV 88.8 11/19/2007 1307   MCH 30.4 03/05/2024 1704   MCHC 33.0 03/05/2024 1704   RDW 12.2 03/05/2024 1704   RDW 11.8 07/16/2021 1519   RDW 12.1 11/19/2007 1307   LYMPHSABS 2.0 03/05/2024 1704   LYMPHSABS 2.0 07/16/2021 1519   LYMPHSABS 2.1 11/19/2007 1307   MONOABS 0.8 03/05/2024 1704   MONOABS 0.7 11/19/2007 1307   EOSABS 0.4 03/05/2024 1704   EOSABS 0.2 07/16/2021 1519   BASOSABS 0.1 03/05/2024 1704   BASOSABS 0.0 07/16/2021 1519   BASOSABS 0.0 11/19/2007 1307    BMET    Component Value Date/Time   NA 141 03/05/2024 1706   NA 141 07/16/2021 1519   K 3.7 03/05/2024 1706   CL 102 03/05/2024 1706   CO2 26 03/05/2024 1704   GLUCOSE 132 (H) 03/05/2024 1706   BUN 16 03/05/2024 1706   BUN 20 07/16/2021 1519   CREATININE 0.80 03/05/2024 1706   CALCIUM  8.9 03/05/2024 1704   EGFR 92 07/16/2021 1519   GFRNONAA >60 03/05/2024 1704    IMAGING past 24 hours ECHOCARDIOGRAM COMPLETE Result Date:  03/07/2024    ECHOCARDIOGRAM REPORT   Patient Name:   Abdulraheem Pineo. Date of Exam: 03/06/2024 Medical Rec #:  [id number]        Height:       70.0 in Accession #:    [id number]       Weight:       176.4 lb Date of Birth:  1940/08/06       BSA:          1.979 m Patient Age:    83 years         BP:           131/79 mmHg Patient Gender: M                HR:           77 bpm. Exam Location:  Inpatient Procedure: 2D Echo (Both Spectral and Color Flow Doppler were utilized during            procedure). Indications:    stroke  History:        Patient has prior history of Echocardiogram examinations, most                 recent 05/16/2023. Risk Factors:Hypertension.  Sonographer:  Tinnie Barefoot RDCS Referring Phys: [id number] DEVON SHAFER IMPRESSIONS  1. Left ventricular ejection fraction, by estimation, is 60 to 65%. The left ventricle has normal function. The left ventricle has no regional wall motion abnormalities. There is mild left ventricular hypertrophy. Left ventricular diastolic parameters are consistent with Grade I diastolic dysfunction (impaired relaxation).  2. Right ventricular systolic function is normal. The right ventricular size is normal.  3. The mitral valve is normal in structure. No evidence of mitral valve regurgitation. No evidence of mitral stenosis.  4. The aortic valve has an indeterminant number of cusps. Aortic valve regurgitation is not visualized. No aortic stenosis is present.  5. Aortic dilatation noted. There is mild dilatation of the ascending aorta, measuring 40 mm.  6. The inferior vena cava is normal in size with greater than 50% respiratory variability, suggesting right atrial pressure of 3 mmHg. Comparison(s): No significant change from prior study. FINDINGS  Left Ventricle: Left ventricular ejection fraction, by estimation, is 60 to 65%. The left ventricle has normal function. The left ventricle has no regional wall motion abnormalities. Strain was performed and the global  longitudinal strain is indeterminate. The left ventricular internal cavity size was normal in size. There is mild left ventricular hypertrophy. Left ventricular diastolic parameters are consistent with Grade I diastolic dysfunction (impaired relaxation). Normal left ventricular filling pressure. Right Ventricle: The right ventricular size is normal. No increase in right ventricular wall thickness. Right ventricular systolic function is normal. Left Atrium: Left atrial size was normal in size. Right Atrium: Right atrial size was normal in size. Pericardium: There is no evidence of pericardial effusion. Mitral Valve: The mitral valve is normal in structure. No evidence of mitral valve regurgitation. No evidence of mitral valve stenosis. Tricuspid Valve: The tricuspid valve is normal in structure. Tricuspid valve regurgitation is not demonstrated. No evidence of tricuspid stenosis. Aortic Valve: The aortic valve has an indeterminant number of cusps. Aortic valve regurgitation is not visualized. No aortic stenosis is present. Pulmonic Valve: The pulmonic valve was not well visualized. Pulmonic valve regurgitation is not visualized. No evidence of pulmonic stenosis. Aorta: The aortic root is normal in size and structure and aortic dilatation noted. There is mild dilatation of the ascending aorta, measuring 40 mm. Venous: The inferior vena cava is normal in size with greater than 50% respiratory variability, suggesting right atrial pressure of 3 mmHg. IAS/Shunts: No atrial level shunt detected by color flow Doppler. Additional Comments: 3D was performed not requiring image post processing on an independent workstation and was indeterminate.  LEFT VENTRICLE PLAX 2D LVIDd:         3.70 cm   Diastology LVIDs:         2.50 cm   LV e' medial:    5.66 cm/s LV PW:         1.10 cm   LV E/e' medial:  8.5 LV IVS:        1.20 cm   LV e' lateral:   7.94 cm/s LVOT diam:     2.30 cm   LV E/e' lateral: 6.0 LV SV:         69 LV SV Index:    35 LVOT Area:     4.15 cm LV IVRT:       67 msec  RIGHT VENTRICLE             IVC RV S prime:     11.70 cm/s  IVC diam: 1.50 cm TAPSE (M-mode): 1.3 cm LEFT ATRIUM  Index        RIGHT ATRIUM           Index LA diam:        3.40 cm 1.72 cm/m   RA Area:     11.10 cm LA Vol (A2C):   38.1 ml 19.26 ml/m  RA Volume:   19.90 ml  10.06 ml/m LA Vol (A4C):   28.4 ml 14.35 ml/m LA Biplane Vol: 32.6 ml 16.48 ml/m  AORTIC VALVE LVOT Vmax:   85.00 cm/s LVOT Vmean:  58.700 cm/s LVOT VTI:    0.165 m  AORTA Ao Root diam: 3.60 cm Ao Asc diam:  4.00 cm MITRAL VALVE MV Area (PHT): 4.06 cm    SHUNTS MV Decel Time: 187 msec    Systemic VTI:  0.16 m MV E velocity: 48.00 cm/s  Systemic Diam: 2.30 cm MV A velocity: 75.80 cm/s MV E/A ratio:  0.63 Vishnu Priya Mallipeddi Electronically signed by Diannah Late Mallipeddi Signature Date/Time: 03/07/2024/10:49:35 AM    Final    MR BRAIN WO CONTRAST Result Date: 03/06/2024 EXAM: MRI BRAIN WITHOUT CONTRAST 03/06/2024 04:58:21 PM TECHNIQUE: Multiplanar multisequence MRI of the head/brain was performed without the administration of intravenous contrast. COMPARISON: 05/15/2023 CLINICAL HISTORY: Neuro deficit, acute, stroke suspected. FINDINGS: BRAIN AND VENTRICLES: No acute infarct. No intracranial hemorrhage. No mass. No midline shift. Ex vacuo dilatation of the lateral ventricles. Generalized volume loss. Early confluent hyperintense T2-weighted signal within the cerebral white matter, most commonly due to chronic small vessel disease. Old small left cerebellar infarct. The sella is unremarkable. Normal flow voids. ORBITS: Ocular lens replacements. SINUSES AND MASTOIDS: Bilateral mastoid effusions. Nasopharynx is clear. BONES AND SOFT TISSUES: Normal marrow signal. No acute soft tissue abnormality. IMPRESSION: 1. No acute intracranial abnormality. 2. Early confluent T2 hyperintense cerebral white matter signal, most consistent with chronic small vessel disease. 3. Old small  left cerebellar infarct. 4. Generalized volume loss with ex vacuo dilatation of the lateral ventricles. Electronically signed by: Franky Stanford MD 03/06/2024 05:10 PM EDT RP Workstation: [id number]    Vitals:   03/07/24 0800 03/07/24 0900 03/07/24 1000 03/07/24 1100  BP: 131/79 137/86 (!) 146/97 111/67  Pulse: 79 73 96 86  Resp: 16 17 18 11   Temp: 98 F (36.7 C)   98 F (36.7 C)  TempSrc: Axillary     SpO2: 100% 98% 90% 100%  Weight:      Height:         PHYSICAL EXAM General:  Alert, well-nourished, well-developed elderly Caucasian male in no acute distress Psych:  Mood and affect appropriate for situation CV: Regular rate and rhythm on monitor Respiratory:  Regular, unlabored respirations on room air GI: Abdomen soft and nontender   NEURO:  Mental Status: He is awake and alert, oriented to place and person but not to time.  He can speak in simple sentences and repeat simple sentences and follow commands.  Disoriented to place and person and time.  Follows only simple midline and few one-step commands.  Cranial Nerves:  II: PERRL. Visual fields full.  III, IV, VI: EOMI. Eyelids elevate symmetrically.  V: Sensation is intact to light touch and symmetrical to face.  VII: Face is symmetrical resting and smiling VIII: hearing intact to voice. IX, X: Palate elevates symmetrically. Phonation is normal.  KP:Dynloizm shrug 5/5. XII: tongue is midline without fasciculations. Motor: 5/5 strength to all muscle groups tested.  Tone: is normal and bulk is normal Sensation- Intact to light touch bilaterally. Extinction absent  to light touch to DSS.   Coordination: FTN intact bilaterally, HKS: no ataxia in BLE.No drift.  Gait- deferred      ASSESSMENT/PLAN  Mr. Zayn Selley. is a 83 y.o. male with history of stroke, HTN, neuropathy, left carotid stenosis, gait dysfunction with wheelchair use, diabetes, history of colon cancer presenting with an acute onset of altered mental  status   NIH on Admission 8  Strokelike episode treated with IV TNK for left facial droop and increased confusion Code Stroke  CT head No acute abnormality.  Small vessel disease. Atrophy.  ASPECTS 10.     CTA head & neck No LVO  Repeat CT head stable  MRI scheduled for tonight around 5 PM 2D Echo EF 60 to 65%.  Mild LVH.  Left atrial size normal. Loop interrogated and negative for any arrhythmia Routine EEG This study is suggestive of cortical dysfunction arising from  left hemisphere likely secondary to underlying structural abnormality. Additionally there is generalized cerebral dysfunction (encephalopathy). No seizures or epileptiform discharges were seen throughout the recording.  UA negative LDL 45 HgbA1c 6.2 VTE prophylaxis -SCDs aspirin  81 mg daily prior to admission, now on now on aspirin  and Plavix  x 3 weeks and then plavix  alone Therapy recommendations:  Pending Disposition: Pending  Hypertension Home meds: None Stable Blood Pressure Goal: BP less than 180/105   Hyperlipidemia Home meds: Crestor  20 mg,  resumed in hospital LDL 45, goal < 70 Continue statin at discharge  Diabetes type II Controlled Home meds: Metformin HgbA1c 6.2, goal < 7.0 CBGs SSI Recommend close follow-up with PCP for better DM control  Dysphagia Patient has post-stroke dysphagia, SLP consulted    Diet   DIET DYS 3 Room service appropriate? Yes with Assist; Fluid consistency: Thin   Advance diet as tolerated  Hospital day # 2     Patient presented with sudden onset of altered mental status and left facial droop and received IV TNK for presumed stroke.  He does have cognitive impairment at baseline and is nonambulatory following recent strokes in January 2025 which were felt to be cryptogenic during or A-fib was found yet.  Continue close neurological observation and strict blood pressure control as per post TNK protocol.  Mobilize out of bed.  Physical Occupational Therapy consults.  Start  aspirin  and Plavix .  Transfer out of ICU to floor bed..  No family available during a.m. rounds for discussion.    I personally spent a total of 50 minutes in the care of the patient today including getting/reviewing separately obtained history, performing a medically appropriate exam/evaluation, counseling and educating, placing orders, referring and communicating with other health care professionals, documenting clinical information in the EHR, independently interpreting results, and coordinating care.         Eather Popp, MD Medical Director [facility] Stroke [facility] Pager: [phone] 03/07/2024 12:43 PM   To contact Stroke Continuity provider, please refer to [email or web address]. After hours, contact General Neurology

## 2024-03-07 NOTE — Plan of Care (Addendum)
 Pt admitted as a transfer from 4N, alert and oriented to person, family an place, vital signs stable, tele box connected, bed alarm on, call bell within reach. Soon after arrival to the unit Pt started to have hiccups which didn't stopped for more than 1 hours, MD notified, got an order for Thorazine, but at the time of med administration, no hiccups noted, so med not given, patient is calm resting in bed Problem: Ischemic Stroke/TIA Tissue Perfusion: Goal: Complications of ischemic stroke/TIA will be minimized Outcome: Progressing   Problem: Coping: Goal: Will verbalize positive feelings about self Outcome: Progressing Goal: Will identify appropriate support needs Outcome: Progressing   Problem: Self-Care: Goal: Ability to communicate needs accurately will improve Outcome: Progressing   Problem: Nutrition: Goal: Risk of aspiration will decrease Outcome: Progressing   Problem: Clinical Measurements: Goal: Respiratory complications will improve Outcome: Progressing Goal: Cardiovascular complication will be avoided Outcome: Progressing   Problem: Activity: Goal: Risk for activity intolerance will decrease Outcome: Progressing   Problem: Nutrition:  Goal: Adequate nutrition will be maintained Outcome: Progressing   Problem: Coping: Goal: Level of anxiety will decrease Outcome: Progressing   Problem: Skin Integrity: Goal: Risk for impaired skin integrity will decrease Outcome: Progressing

## 2024-03-07 NOTE — Progress Notes (Signed)
 Speech Language Pathology Treatment: Dysphagia  Patient Details Name: Jared [name]. MRN: [id number] DOB: January 08, 1941 Today's Date: 03/07/2024 Time: 1010-1020 SLP Time Calculation (min) (ACUTE ONLY): 10 min  Assessment / Plan / Recommendation Clinical Impression  Pt self-fed trials of regular solids and thin liquids with occasional, delayed throat clear observed, similar to initial evaluation. Despite this, he had no overt coughing even when challenged. Overall processing is slow with oral preparation also slow, but he seems to clear his mouth well. Min cues were given primarily for cognitive impact on swallowing. Recommend continuing with Dys 3 (mechanical soft) diet and thin liquids.   HPI HPI: 83 yo male with history of stroke, neuropathy, L carotid stenosis, gait dysfunction with wheelchair use, DM, history of colon cancer who is presenting with acute onset of AMS. S/p TNK 10/31. CTH negative, MRI 11/1 without acute abnormality. CXR with stable bibasilar (L>R) atelectasis. Pt followed for cognition during admission in January 2025 for acute/early subacute L parietal and R frontal/occipital stroke.      SLP Plan  Continue with current plan of care          Recommendations  Diet recommendations: Dysphagia 3 (mechanical soft);Thin liquid Liquids provided via: Cup;Straw Medication Administration: Whole meds with puree Supervision: Staff to assist with self feeding;Full supervision/cueing for compensatory strategies Compensations: Minimize environmental distractions;Slow rate;Small sips/bites Postural Changes and/or Swallow Maneuvers: Seated upright 90 degrees                  Oral care BID     Dysphagia, unspecified (R13.10)     Continue with current plan of care     Leita SAILOR., M.A. CCC-SLP Acute Rehabilitation Services Office: [phone]  Secure chat preferred   03/07/2024, 12:16 PM

## 2024-03-07 NOTE — Evaluation (Signed)
 Speech Language Pathology Evaluation Patient Details Name: Jared [name]. 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   Chemotherapy-induced neuropathy 02/08/2020   Neuropathy 02/08/2020   At high risk for injury related to fall 02/08/2020   Past Medical History:  Past Medical History:  Diagnosis Date   colon cancer    History of kidney stones    Hypertension    Patient denies   Neuropathy due to chemotherapeutic drug    Neurologist says doesnt have it   Pre-diabetes    Past Surgical History:  Past Surgical History:  Procedure Laterality Date   CATARACT EXTRACTION, BILATERAL     COLON SURGERY  2004   R hemicolectomy - removed due to cancer   COLONOSCOPY     HEMICOLECTOMY     HERNIA REPAIR  2005   LOOP RECORDER INSERTION N/A 05/19/2023   Procedure: LOOP RECORDER INSERTION;  Surgeon: Lesia Ozell Barter, PA-C;  Location: [facility];  Service: Cardiovascular;  Laterality: N/A;   REVERSE SHOULDER ARTHROPLASTY Left 11/29/2020   Procedure: REVERSE SHOULDER ARTHROPLASTY;  Surgeon: Cristy Bonner DASEN, MD;  Location: [facility];  Service: Orthopedics;  Laterality: Left;   HPI:  83 yo male with history of stroke, neuropathy, L carotid stenosis, gait dysfunction with wheelchair use, DM, history of colon cancer who is presenting with acute onset of AMS. S/p TNK 10/31. CTH negative, MRI 11/1 without acute abnormality. CXR with stable bibasilar (L>R) atelectasis. Pt followed for cognition during admission in January  2025 for acute/early subacute L parietal and R frontal/occipital stroke.   Assessment / Plan / Recommendation Clinical Impression  Pt appears to have had an acute decline in cognition and communication, although no family is present to confirm PLOF. Current presentation appears to at least be a change since CIR admission earlier this year. Recommend ongoing SLP f/u acutely and at next level of care.   Based on discharge summary from previous CIR stay in January 2025, he was at a Min-Mod assist level for cognition. He had shown improvement with awareness, orientation, memory, and problem solving. Today, he shows impairment in these areas as well as linguistic errors. Question impact of hearing, but pt requires sometimes multiple repetitions for comprehension. He has some paraphasias and can produce only two or three words during a divergent naming task. Confrontational naming was WNL. Speech is mostly impacted by slow rate and monotonous phonation. His overall processing is slowed, benefiting from extra time as long as he can maintain his sustained attention.      SLP Assessment  SLP Recommendation/Assessment: Patient needs continued Speech Language Pathology Services SLP Visit Diagnosis: Dysphagia, unspecified (R13.10)     Assistance Recommended at Discharge  Frequent or constant Supervision/Assistance  Functional Status Assessment Patient has had a recent decline in their functional status and demonstrates the ability to make significant improvements in function in a reasonable and predictable amount of time.  Frequency and Duration min 2x/week  2 weeks      SLP Evaluation Cognition  Overall Cognitive Status: No family/caregiver  present to determine baseline cognitive functioning Arousal/Alertness: Awake/alert Orientation Level: Oriented to person;Oriented to place;Disoriented to time;Disoriented to situation Attention: Sustained Sustained Attention: Impaired Sustained Attention  Impairment: Verbal complex Memory: Impaired Memory Impairment: Decreased recall of new information Awareness: Impaired Awareness Impairment: Intellectual impairment Problem Solving: Impaired Problem Solving Impairment: Verbal basic Safety/Judgment: Impaired       Comprehension  Auditory Comprehension Overall Auditory Comprehension: Impaired Yes/No Questions: Impaired Basic Biographical Questions: 76-100% accurate Basic Immediate Environment Questions: 75-100% accurate Complex Questions: 50-74% accurate Commands: Impaired One Step Basic Commands: 75-100% accurate Conversation: Simple Interfering Components: Hearing    Expression Expression Primary Mode of Expression: Verbal Verbal Expression Overall Verbal Expression: Impaired Initiation: No impairment Automatic Speech: Name Level of Generative/Spontaneous Verbalization: Phrase Naming: Impairment Confrontation: Within functional limits Divergent: 0-24% accurate Pragmatics: Impairment Impairments: Abnormal affect;Monotone   Oral / Motor  Oral Motor/Sensory Function Overall Oral Motor/Sensory Function: Mild impairment Facial ROM: Reduced left;Suspected CN VII (facial) dysfunction Facial Symmetry: Abnormal symmetry left;Suspected CN VII (facial) dysfunction Lingual Symmetry: Within Functional Limits Motor Speech Overall Motor Speech: Impaired (slow rate)            Leita SAILOR., M.A. CCC-SLP Acute Rehabilitation Services Office: [phone]  Secure chat preferred  03/07/2024, 12:24 PM

## 2024-03-08 DIAGNOSIS — E1151 Type 2 diabetes mellitus with diabetic peripheral angiopathy without gangrene: Secondary | ICD-10-CM | POA: Diagnosis not present

## 2024-03-08 DIAGNOSIS — R29810 Facial weakness: Secondary | ICD-10-CM | POA: Diagnosis not present

## 2024-03-08 DIAGNOSIS — I69391 Dysphagia following cerebral infarction: Secondary | ICD-10-CM | POA: Diagnosis not present

## 2024-03-08 DIAGNOSIS — R410 Disorientation, unspecified: Secondary | ICD-10-CM | POA: Diagnosis not present

## 2024-03-08 LAB — GLUCOSE, CAPILLARY: Glucose-Capillary: 142 mg/dL — ABNORMAL HIGH (ref 70–99)

## 2024-03-08 NOTE — TOC CAGE-AID Note (Signed)
 Transition of Care [facility]) - CAGE-AID Screening   Patient Details  Name: Jared [name]. MRN: [id number] Date of Birth: May 05, 1941  Transition of Care [facility]) CM/SW Contact:    Andrez JULIANNA George, RN Phone Number: 03/08/2024, 2:13 PM   Clinical Narrative:  Pt refused inpatient/ outpatient alcohol  counseling resources  CAGE-AID Screening:               Substance Abuse Education Offered: Yes (refused resources)

## 2024-03-08 NOTE — Evaluation (Signed)
 Occupational Therapy Evaluation Patient Details Name: Jared [name]. MRN: [id number] DOB: 24-Nov-1940 Today's Date: 03/08/2024   History of Present Illness   Pt is an 83 y.o. male who presented 03/05/24 with AMS, L facial droop, and aphasia. Pt received TNK. MRI brain negative for acute intracranial abnormality. PMHx: HTN, cognitive impairment, remote colon cancer with prior right hemicolectomy, CVA, neuropathy, left carotid stenosis, gait dysfunction with wheelchair use, pre-diabetes     Clinical Impressions Prior to this admission, patient living with his spouse, using a bilateral platform walker in the house, and a power wheelchair in the community. Patient needed occasional assist in order to complete ADLs, but typically independent. Currently, patient with slowed responses to all questions, decreased command following, and need for significant assist for bed mobility, ADLs, and transfers. Patient also with L inattention and LUE weakness, and decreased sitting balance. Patient mod A for bed mobility, heavy mod A for sit<>stands, and unable to progress to a stand pivot due to posterior lean, poor motor planning, and decreased safety awareness. Given prior level, OT recommending lesser intensive rehab < 3 hours prior to discharge home. OT will continue to follow acutely.      If plan is discharge home, recommend the following:   Two people to help with walking and/or transfers;A lot of help with bathing/dressing/bathroom;Assistance with cooking/housework;Assistance with feeding;Direct supervision/assist for financial management;Assist for transportation;Help with stairs or ramp for entrance;Supervision due to cognitive status;Direct supervision/assist for medications management     Functional Status Assessment   Patient has had a recent decline in their functional status and demonstrates the ability to make significant improvements in function in a reasonable and predictable amount of  time.     Equipment Recommendations   Other (comment) (defer to next venue)     Recommendations for Other Services         Precautions/Restrictions   Precautions Precautions: Fall Recall of Precautions/Restrictions: Impaired Restrictions Weight Bearing Restrictions Per Provider Order: No     Mobility Bed Mobility Overal bed mobility: Needs Assistance Bed Mobility: Supine to Sit, Sit to Supine     Supine to sit: HOB elevated, Used rails, Mod assist Sit to supine: Mod assist   General bed mobility comments: Mod A to transition to EOB, poor motor planning, significant extra time required to initiate, R lateral lean and increased assist for sitting balance    Transfers Overall transfer level: Needs assistance Equipment used: Rolling walker (2 wheels) Transfers: Sit to/from Stand Sit to Stand: Mod assist, From elevated surface           General transfer comment: Max cues for hand placement, elevated surface, momentum to stand, heavy mod A to come into standing x2, posterior lean and patient unable to correct, unable to weight shift to take steps to [facility]      Balance Overall balance assessment: Needs assistance Sitting-balance support: No upper extremity supported, Bilateral upper extremity supported, Feet supported Sitting balance-Leahy Scale: Poor Sitting balance - Comments: R lateral lean, needed assist to maintain   Standing balance support: Bilateral upper extremity supported, Reliant on assistive device for balance, During functional activity Standing balance-Leahy Scale: Poor Standing balance comment: reliant on RW and external physical assistance                           ADL either performed or assessed with clinical judgement   ADL Overall ADL's : Needs assistance/impaired Eating/Feeding: Minimal assistance;Sitting   Grooming: Minimal assistance;Sitting  Upper Body Bathing: Minimal assistance;Sitting   Lower Body Bathing: Moderate  assistance;Maximal assistance;Sitting/lateral leans;Sit to/from stand   Upper Body Dressing : Minimal assistance;Sitting   Lower Body Dressing: Moderate assistance;Maximal assistance;Sitting/lateral leans   Toilet Transfer: Moderate assistance;Stand-pivot;BSC/3in1;Rolling walker (2 wheels) Toilet Transfer Details (indicate cue type and reason): simulated Toileting- Clothing Manipulation and Hygiene: Moderate assistance;Sit to/from stand;Sitting/lateral lean Toileting - Clothing Manipulation Details (indicate cue type and reason): unable to complete peri-care in standing       General ADL Comments: Prior to this admission, patient living with his spouse, using a bilateral platform walker in the house, and a power wheelchair in the community. Patient needed occasional assist in order to complete ADLs, but typically independent. Currently, patient with slowed responses to all questions, decreased command following, and need for significant assist for bed mobility, ADLs, and transfers. Patient also with L inattention, and decreased sitting balance. Patient mod A for bed mobility, heavy mod A for sit<>stands, and unable to progress to a stand pivot due to posterior lean, poor motor planning, and decreased safety awareness. Given prior level, OT recommending lesser intensive rehab < 3 hours prior to discharge home. OT will continue to follow acutely.     Vision Baseline Vision/History: 1 Wears glasses Ability to See in Adequate Light: 0 Adequate Patient Visual Report: No change from baseline Vision Assessment?: No apparent visual deficits Additional Comments: Will continue to assess, unable to follow commands sitting EOB     Perception Perception: Impaired Preception Impairment Details: Inattention/Neglect Perception-Other Comments: LEFT   Praxis Praxis: Impaired Praxis Impairment Details: Initiation, Motor planning     Pertinent Vitals/Pain Pain Assessment Pain Assessment: Faces Faces  Pain Scale: No hurt Pain Intervention(s): Limited activity within patient's tolerance, Monitored during session, Repositioned     Extremity/Trunk Assessment Upper Extremity Assessment Upper Extremity Assessment: Generalized weakness;LUE deficits/detail LUE Deficits / Details: decreased grip and strength, sensation intact 3/5 LUE Sensation: WNL LUE Coordination: decreased gross motor;decreased fine motor   Lower Extremity Assessment Lower Extremity Assessment: Defer to PT evaluation   Cervical / Trunk Assessment Cervical / Trunk Assessment: Kyphotic (minimally)   Communication Communication Communication: Impaired Factors Affecting Communication: Difficulty expressing self;Reduced clarity of speech   Cognition Arousal: Lethargic Behavior During Therapy: Flat affect Cognition: Cognition impaired   Orientation impairments: Time Awareness: Intellectual awareness impaired, Online awareness impaired Memory impairment (select all impairments): Short-term memory, Working memory Attention impairment (select first level of impairment): Focused attention Executive functioning impairment (select all impairments): Initiation, Organization, Sequencing, Reasoning, Problem solving OT - Cognition Comments: Slowed responses for all questions, decreased safety awareness and insight, unable to expand upon prior level with OT                 Following commands: Impaired Following commands impaired: Follows one step commands inconsistently, Follows one step commands with increased time     Cueing  General Comments   Cueing Techniques: Verbal cues;Tactile cues  VSS on RA   Exercises     Shoulder Instructions      Home Living Family/patient expects to be discharged to:: Private residence Living Arrangements: Spouse/significant other Available Help at Discharge: Family;Available 24 hours/day;Personal care attendant (wife has bad back so cannot help much, son and his family live nearby  and can assist PRN; PCA comes 1x per week for 5 hours to fix meals) Type of Home: House Home Access: Stairs to enter [employer] of Steps: 4-5 Entrance Stairs-Rails: Left Home Layout: Multi-level;Able to live on main level with bedroom/bathroom (  other floors are basement and attic he does not need to access)     Bathroom Shower/Tub: Producer, Television/film/video: Standard     Home Equipment: Agricultural Consultant (2 wheels);Rollator (4 wheels);Grab bars - tub/shower;Hand held shower head;Shower seat;Wheelchair - Engineer, Technical [employer] - power (bil platform RW)          Prior Functioning/Environment Prior Level of Function : Independent/Modified Independent             Mobility Comments: Uses bil platform RW inside home, walking up to ~50 ft at a time. Uses electric w/c in community. x1 fall in past 6 months ADLs Comments: normally bathes himself but reports sometimes needs assist for lower body dressing    OT Problem List: Decreased strength;Decreased range of motion;Decreased activity tolerance;Decreased coordination;Decreased cognition;Decreased safety awareness;Decreased knowledge of use of DME or AE;Impaired UE functional use;Pain;Decreased knowledge of precautions   OT Treatment/Interventions: Self-care/ADL training;Therapeutic exercise;Neuromuscular education;Energy conservation;DME and/or AE instruction;Manual therapy;Therapeutic activities;Patient/family education;Balance training;Cognitive remediation/compensation      OT Goals(Current goals can be found in the care plan section)   Acute Rehab OT Goals Patient Stated Goal: unable OT Goal Formulation: Patient unable to participate in goal setting Time For Goal Achievement: 03/22/24 Potential to Achieve Goals: Fair   OT Frequency:  Min 2X/week    Co-evaluation              AM-PAC OT 6 Clicks Daily Activity     Outcome Measure Help from another person eating meals?: A Little Help from another person  taking care of personal grooming?: A Little Help from another person toileting, which includes using toliet, bedpan, or urinal?: A Lot Help from another person bathing (including washing, rinsing, drying)?: A Lot Help from another person to put on and taking off regular upper body clothing?: A Little Help from another person to put on and taking off regular lower body clothing?: A Lot 6 Click Score: 15   End of Session Equipment Utilized During Treatment: Gait belt;Rolling walker (2 wheels) Nurse Communication: Mobility status  Activity Tolerance: Patient tolerated treatment well Patient left: in bed;with call bell/phone within reach;with bed alarm set  OT Visit Diagnosis: Unsteadiness on feet (R26.81);Other abnormalities of gait and mobility (R26.89);Muscle weakness (generalized) (M62.81);History of falling (Z91.81);Other symptoms and signs involving cognitive function                Time: 9166-9097 OT Time Calculation (min): 29 min Charges:  OT General Charges $OT Visit: 1 Visit OT Evaluation $OT Eval Moderate Complexity: 1 Mod OT Treatments $Self Care/Home Management : 8-22 mins  Ronal Gift E. Nidya Bouyer, OTR/L Acute Rehabilitation Services [phone]   Ronal Gift Salt 03/08/2024, 10:16 AM

## 2024-03-08 NOTE — Care Management Important Message (Signed)
 Important Message  Patient Details  Name: Jared [name]. MRN: [id number] Date of Birth: [birth year]   Important Message Given:  Yes - Medicare IM     [name] 03/08/2024, 4:00 PM

## 2024-03-08 NOTE — Progress Notes (Signed)
 Physical Therapy Treatment Patient Details Name: Jared [name]. MRN: [id number] DOB: 1940-06-13 Today's Date: 03/08/2024   History of Present Illness Pt is an 83 y.o. male who presented 03/05/24 with AMS, L facial droop, and aphasia. Pt received TNK. MRI brain negative for acute intracranial abnormality. PMHx: HTN, cognitive impairment, remote colon cancer with prior right hemicolectomy, CVA, neuropathy, left carotid stenosis, gait dysfunction with wheelchair use, pre-diabetes    PT Comments  Pt received in supine and agreeable to session. Pt demonstrates some expressive difficulties, but is able to follow cues with increased time. Pt demonstrates posterior and R lateral leans in sitting and standing requiring CGA-min A to correct. Pt able to stand from EOB x2 and step pivot to recliner with mod A for balance and weight shifting. B platform RW used due to pt using PTA, however no improvement in balance noted. Pt demonstrates increased difficulty advancing LLE requiring intermittent assist. Pt demonstrates increased flexed posture with fatigue. Pt continues to benefit from PT services to progress toward functional mobility goals.    If plan is discharge home, recommend the following: A lot of help with walking and/or transfers;A little help with bathing/dressing/bathroom;Assistance with cooking/housework;Direct supervision/assist for medications management;Direct supervision/assist for financial management;Assist for transportation;Help with stairs or ramp for entrance;Supervision due to cognitive status   Can travel by private vehicle        Equipment Recommendations  BSC/3in1    Recommendations for Other Services       Precautions / Restrictions Precautions Precautions: Fall Recall of Precautions/Restrictions: Impaired Restrictions Weight Bearing Restrictions Per Provider Order: No     Mobility  Bed Mobility Overal bed mobility: Needs Assistance Bed Mobility: Supine to Sit      Supine to sit: HOB elevated, Used rails, Min assist     General bed mobility comments: HOB significantly elevated with increased time and cues for BLE advancement to EOB and assist for trunk elevation and scooting to EOB    Transfers Overall transfer level: Needs assistance Equipment used: Bilateral platform walker Transfers: Sit to/from Stand, Bed to chair/wheelchair/BSC Sit to Stand: Mod assist, From elevated surface   Step pivot transfers: Mod assist       General transfer comment: STS from elevated EOB with mod A for power up and anterior weight shift. posterior bias and hip flexion throughout despite cues. Pt able to take small steps to pivot to the recliner with assist for weight shift and balance. Increased difficulty advancing LLE and R knee flexed requiring cues for extension.    Ambulation/Gait         Gait velocity: reduced     General Gait Details: unable due to poor balance   Stairs             Wheelchair Mobility     Tilt Bed    Modified Rankin (Stroke Patients Only) Modified Rankin (Stroke Patients Only) Pre-Morbid Rankin Score: Moderate disability Modified Rankin: Moderately severe disability     Balance Overall balance assessment: Needs assistance Sitting-balance support: No upper extremity supported, Bilateral upper extremity supported, Feet supported Sitting balance-Leahy Scale: Poor Sitting balance - Comments: R lateral and posterior leans requiring CGA-min A   Standing balance support: Bilateral upper extremity supported, Reliant on assistive device for balance, During functional activity Standing balance-Leahy Scale: Poor Standing balance comment: reliant on RW and external physical assistance due to posterior bias  Communication Communication Communication: Impaired Factors Affecting Communication: Difficulty expressing self;Reduced clarity of speech  Cognition Arousal: Alert Behavior  During Therapy: Flat affect   PT - Cognitive impairments: Awareness, Initiation, Sequencing, Problem solving, Safety/Judgement                         Following commands: Impaired Following commands impaired: Follows one step commands inconsistently, Follows one step commands with increased time    Cueing Cueing Techniques: Verbal cues, Tactile cues  Exercises      General Comments General comments (skin integrity, edema, etc.): Wife present and supportive throughout      Pertinent Vitals/Pain Pain Assessment Pain Assessment: No/denies pain     PT Goals (current goals can now be found in the care plan section) Acute Rehab PT Goals Patient Stated Goal: to improve PT Goal Formulation: With patient Time For Goal Achievement: 03/21/24 Progress towards PT goals: Progressing toward goals    Frequency    Min 3X/week       AM-PAC PT 6 Clicks Mobility   Outcome Measure  Help needed turning from your back to your side while in a flat bed without using bedrails?: A Little Help needed moving from lying on your back to sitting on the side of a flat bed without using bedrails?: A Little Help needed moving to and from a bed to a chair (including a wheelchair)?: A Lot Help needed standing up from a chair using your arms (e.g., wheelchair or bedside chair)?: A Lot Help needed to walk in hospital room?: Total Help needed climbing 3-5 steps with a railing? : Total 6 Click Score: 12    End of Session Equipment Utilized During Treatment: Gait belt Activity Tolerance: Patient tolerated treatment well;Patient limited by fatigue Patient left: in chair;with call bell/phone within reach;with chair alarm set Nurse Communication: Mobility status PT Visit Diagnosis: Unsteadiness on feet (R26.81);Other abnormalities of gait and mobility (R26.89);Muscle weakness (generalized) (M62.81);Difficulty in walking, not elsewhere classified (R26.2)     Time: 8491-8464 PT Time  Calculation (min) (ACUTE ONLY): 27 min  Charges:    $Therapeutic Activity: 23-37 mins PT General Charges $$ ACUTE PT VISIT: 1 Visit                     Darryle George, PTA Acute Rehabilitation Services Secure Chat Preferred  [phone]    Darryle George 03/08/2024, 3:55 PM

## 2024-03-08 NOTE — Plan of Care (Signed)
  Problem: Pain Managment: Goal: General experience of comfort will improve and/or be controlled Outcome: Adequate for Discharge

## 2024-03-08 NOTE — TOC Initial Note (Addendum)
 Transition of Care (TOC) - Initial/Assessment Note    Patient Details  Name: Jared [name]. MRN: [id number] Date of Birth: [birth year]  Transition of Care [facility]) CM/SW Contact:    Jared JULIANNA George, RN Phone Number: 03/08/2024, 2:15 PM  Clinical Narrative:                 Jared [name]. is a 83 y.o. male with hx of stroke, HTN, neuropathy, left carotid stenosis, gait dysfunction with wheelchair use, diabetes, history of colon cancer presenting with an acute onset of altered mental status.   Pt is from home with his spouse. They are together most of the time. Pt has caregiver that is with him in the am's 5 days a week.  Wife provides him his medication and provides transportation. Awaiting PT to re-see. OT recommending SNF. IP Care management following.  1519: Wife interested in SNF rehab before home. She asked for [facility] as first choice. CM has asked Randine with [facility] to review. Wife also agreeable to  having pt faxed out in the [facility] area.   Expected Discharge Plan: Skilled Nursing Facility Barriers to Discharge: Continued Medical Work up   Patient Goals and CMS Choice   CMS Medicare.gov Compare Post Acute Care list provided to:: Patient Choice offered to / list presented to : Patient, Spouse      Expected Discharge Plan and Services In-house Referral: Clinical Social Work   Post Acute Care Choice: Skilled Nursing Facility Living arrangements for the past 2 months: Single Family Home                                      Prior Living Arrangements/Services Living arrangements for the past 2 months: Single Family Home Lives with:: Spouse Patient language and need for interpreter reviewed:: Yes Do you feel safe going back to the place where you live?: Yes        Care giver support system in place?: Yes (comment) Current home services: DME (walker/ BSC/ shower seat/ Wheelchair) Criminal Activity/Legal Involvement Pertinent to Current  Situation/Hospitalization: No - Comment as needed  Activities of Daily Living      Permission Sought/Granted                  Emotional Assessment Appearance:: Appears stated age Attitude/Demeanor/Rapport: Engaged Affect (typically observed): Accepting, Quiet Orientation: : Oriented to Self, Oriented to Place, Oriented to Situation Alcohol  / Substance Use: Alcohol  Use Psych Involvement: No (comment)  Admission diagnosis:  Stroke (cerebrum) [facility]) [I63.9] Patient Active Problem List   Diagnosis Date Noted   Stroke (cerebrum) (HCC) 03/05/2024   Acute ischemic stroke (HCC) 05/21/2023   Cardioembolic stroke (HCC) 05/21/2023   Acute CVA (cerebrovascular accident) (HCC) 05/15/2023   Hypertension    MRI of brain abnormal 07/16/2021   History of colon cancer, no staging 07/16/2021   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 [place] [address] Phone: [phone] Fax: [phone]  [name] [address] [place] [address] Phone: [phone] Fax: [phone]     Social Drivers of Health (SDOH) Social History: SDOH Screenings   Food Insecurity: No  Food Insecurity (05/16/2023)  Housing: Low Risk  (05/16/2023)  Transportation Needs: No Transportation Needs (05/16/2023)  Utilities: Not At Risk (05/16/2023)  Depression (PHQ2-9): Low Risk  (07/22/2023)  Social Connections: Moderately Integrated (05/16/2023)  Tobacco Use: Low Risk  (07/22/2023)   SDOH Interventions:     Readmission Risk Interventions     No data to display

## 2024-03-08 NOTE — Progress Notes (Addendum)
 STROKE TEAM PROGRESS NOTE    SIGNIFICANT HOSPITAL EVENTS 10/31 patient presented with acute onset of altered mental status.  He was given IV TNK for NIH 8 11/2: MRI scan of the brain shows no acute infarct.   EEG shows diffuse generalized slowing.  Echocardiogram shows EF of 60 to 65%.  INTERIM HISTORY/SUBJECTIVE  No family at bedside.  Patient sitting up in bed, awake alert cooperative, continues to be somewhat disoriented but improved.  Able to move all extremities without focal weakness or drift seen.  Pending further PT/OT evals today, likely will need SNF on discharge given history of wheelchair use and gait dysfunction prior to admission.   CBC    Component Value Date/Time   WBC 6.9 03/05/2024 1704   RBC 4.61 03/05/2024 1704   HGB 14.6 03/05/2024 1706   HGB 13.8 07/16/2021 1519   HGB 14.8 11/19/2007 1307   HCT 43.0 03/05/2024 1706   HCT 40.4 07/16/2021 1519   HCT 42.7 11/19/2007 1307   PLT 251 03/05/2024 1704   PLT 285 07/16/2021 1519   MCV 92.0 03/05/2024 1704   MCV 91 07/16/2021 1519   MCV 88.8 11/19/2007 1307   MCH 30.4 03/05/2024 1704   MCHC 33.0 03/05/2024 1704   RDW 12.2 03/05/2024 1704   RDW 11.8 07/16/2021 1519   RDW 12.1 11/19/2007 1307   LYMPHSABS 2.0 03/05/2024 1704   LYMPHSABS 2.0 07/16/2021 1519   LYMPHSABS 2.1 11/19/2007 1307   MONOABS 0.8 03/05/2024 1704   MONOABS 0.7 11/19/2007 1307   EOSABS 0.4 03/05/2024 1704   EOSABS 0.2 07/16/2021 1519   BASOSABS 0.1 03/05/2024 1704   BASOSABS 0.0 07/16/2021 1519   BASOSABS 0.0 11/19/2007 1307    BMET    Component Value Date/Time   NA 141 03/05/2024 1706   NA 141 07/16/2021 1519   K 3.7 03/05/2024 1706   CL 102 03/05/2024 1706   CO2 26 03/05/2024 1704   GLUCOSE 132 (H) 03/05/2024 1706   BUN 16 03/05/2024 1706   BUN 20 07/16/2021 1519   CREATININE 0.80 03/05/2024 1706   CALCIUM  8.9 03/05/2024 1704   EGFR 92 07/16/2021 1519   GFRNONAA >60 03/05/2024 1704    IMAGING past 24 hours No results  found.   Vitals:   03/08/24 0516 03/08/24 0811 03/08/24 1218 03/08/24 1411  BP: 102/71 113/67 115/82 121/76  Pulse:  84 80 95  Resp:  19 18 18   Temp:  98.3 F (36.8 C) 98 F (36.7 C) 98.5 F (36.9 C)  TempSrc:  Oral Oral Oral  SpO2:  96% 99% 94%  Weight:      Height:        PHYSICAL EXAM General:  Alert, well-nourished, well-developed elderly Caucasian male in no acute distress CV: Regular rate and rhythm on monitor Respiratory:  Regular, unlabored respirations on room air  NEURO:  Mental Status: He is awake and alert, oriented to place and person but not to time.  He can speak in simple sentences and repeat simple sentences and follow commands.  Disoriented to place and person and time.  Follows only simple midline and few one-step commands.  Cranial Nerves:  II: PERRL. Visual fields full.  III, IV, VI: EOMI. V: Sensation is intact to light touch and symmetrical to face.  VII: Face is symmetrical resting and smiling VIII: hearing intact to voice. IX, X: Palate elevates symmetrically. Phonation is normal.  KP:Dynloizm shrug 5/5. XII: tongue is midline without fasciculations. Motor: 5/5 strength to all muscle groups tested.  Tone: is normal and bulk is normal Sensation- Intact to light touch bilaterally.  Coordination: FTN intact bilaterally, HKS: no ataxia in BLE. Gait- deferred  ASSESSMENT/PLAN  Mr. Jared [name]. is a 83 y.o. male with history of stroke, HTN, neuropathy, left carotid stenosis, gait dysfunction with wheelchair use, diabetes, history of colon cancer presenting with an acute onset of altered mental status   NIH on Admission 8  Strokelike episode treated with IV TNK for left facial droop and increased confusion Code Stroke  CT head No acute abnormality.  Small vessel disease. Atrophy.  ASPECTS 10.     CTA head & neck No LVO  Repeat CT head stable  MRI:  No acute intracranial abnormality Early T2 hyperintense cerebral white matter, consistent with  chronic small vessel disease Old small left cerebellar infarct 2D Echo EF 60 to 65%.  Mild LVH.  Left atrial size normal. Loop interrogated and negative for any arrhythmia Routine EEG This study is suggestive of cortical dysfunction arising from  left hemisphere likely secondary to underlying structural abnormality. Additionally there is generalized cerebral dysfunction (encephalopathy). No seizures or epileptiform discharges were seen throughout the recording.  UA negative LDL 45 HgbA1c 6.2 VTE prophylaxis -SCDs aspirin  81 mg daily prior to admission, continue aspirin  and Plavix  x 3 weeks and then back to aspirin  alone due to no new stroke seen.  Therapy recommendations: SNF Disposition: Pending insurance approval  Hypertension Home meds: None Stable Blood Pressure Goal: BP less than 180/105   Hyperlipidemia Home meds: Crestor  20 mg,  resumed in hospital LDL 45, goal < 70 Continue statin at discharge  Diabetes type II Controlled Home meds: Metformin HgbA1c 6.2, goal < 7.0 CBGs SSI Recommend close follow-up with PCP  Dysphagia Patient has post-stroke dysphagia, SLP consulted    Diet   DIET DYS 3 Room service appropriate? Yes with Assist; Fluid consistency: Thin   Advance diet as tolerated  Hospital day # 3  Pt seen by Neuro NP/APP with MD. Note/plan to be edited by MD as needed.    Rocky JAYSON Likes, DNP, AGACNP-BC [facility] Please use AMION for contact information & EPIC for messaging.  I have personally obtained history,examined this patient, reviewed notes, independently viewed imaging studies, participated in medical decision making and plan of care.ROS completed by me personally and pertinent positives fully documented  I have made any additions or clarifications directly to the above note. Agree with note above.  Patient remains disoriented and slightly confused but without focal deficits.  Therapies recommend skilled nursing facility for rehab.  Continue  ongoing therapies.  No family at the bedside.  Eather Popp, MD Medical Director [facility] Pager: [phone] 03/08/2024 4:53 PM   To contact Stroke Continuity provider, please refer to [email or web address]. After hours, contact General Neurology

## 2024-03-08 NOTE — NC FL2 (Signed)
 [place]  MEDICAID FL2 LEVEL OF CARE FORM     IDENTIFICATION  Patient Name: Mckennon Zwart. Birthdate: 18-May-1940 Sex: male Admission Date (Current Location): 03/05/2024  [facility] and [place] Number:  Producer, Television/film/video and Address:  [facility] [place], [address] [place], [place] [address]      Provider Number: [id number]  Attending Physician Name and Address:  Stroke, Md, MD  Relative Name and Phone Number:  Willis,[name] [phone]  [phone]    Current Level of Care: Hospital Recommended Level of Care: Skilled Nursing Facility Prior Approval Number:    Date Approved/Denied:   PASRR Number: [id number]  Discharge Plan: SNF    Current Diagnoses: Patient Active Problem List   Diagnosis Date Noted   Stroke (cerebrum) (HCC) 03/05/2024   Acute ischemic stroke (HCC) 05/21/2023   Cardioembolic stroke (HCC) 05/21/2023   Acute CVA (cerebrovascular accident) (HCC) 05/15/2023   Hypertension    MRI of brain abnormal 07/16/2021   History of colon cancer, no staging 07/16/2021   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   Chemotherapy-induced neuropathy 02/08/2020   Neuropathy 02/08/2020   At high risk for injury related to fall 02/08/2020    Orientation RESPIRATION BLADDER Height & Weight     Self, Time, Situation, Place  Normal Incontinent Weight: 80 kg Height:  5' 10 (177.8 cm)  BEHAVIORAL SYMPTOMS/MOOD NEUROLOGICAL BOWEL NUTRITION STATUS      Incontinent Diet (dysphagia 3 with thin liquids)  AMBULATORY STATUS COMMUNICATION OF NEEDS Skin   Extensive Assist Verbally Bruising, Other (Comment) (Bruising to arm and hip/ Petechia on chest)                       Personal Care Assistance Level of Assistance  Bathing, Feeding, Dressing Bathing Assistance: Maximum assistance Feeding assistance: Limited assistance Dressing Assistance: Maximum assistance     Functional Limitations Info  Sight, Hearing, Speech Sight Info:  Adequate Hearing Info: Adequate Speech Info:  (delayed)    SPECIAL CARE FACTORS FREQUENCY  PT (By licensed PT), OT (By licensed OT)     PT Frequency: 5x/wk OT Frequency: 5x/wk            Contractures Contractures Info: Not present    Additional Factors Info  Code Status, Allergies Code Status Info: full Allergies Info: NKA           Current Medications (03/08/2024):  This is the current hospital active medication list Current Facility-Administered Medications  Medication Dose Route Frequency Provider Last Rate Last Admin   acetaminophen  (TYLENOL ) tablet 650 mg  650 mg Oral Q4H PRN Remi Pippin, NP       Or   acetaminophen  (TYLENOL ) 160 MG/5ML solution 650 mg  650 mg Per Tube Q4H PRN Remi Pippin, NP       Or   acetaminophen  (TYLENOL ) suppository 650 mg  650 mg Rectal Q4H PRN Remi Pippin, NP       aspirin  EC tablet 81 mg  81 mg Oral Daily Sethi, Pramod S, MD   81 mg at 03/08/24 1032   Chlorhexidine  Gluconate Cloth 2 % PADS 6 each  6 each Topical Daily Jerri Pfeiffer, MD   6 each at 03/08/24 1031   clevidipine (CLEVIPREX) infusion 0.5 mg/mL  0-21 mg/hr Intravenous Continuous Remi Pippin, NP   Stopped at 03/05/24 2121   clopidogrel  (PLAVIX ) tablet 75 mg  75 mg Oral Daily Sethi, Pramod S, MD   75 mg at 03/08/24 1032  feeding supplement (ENSURE PLUS HIGH PROTEIN) liquid 237 mL  237 mL Oral BID BM Sethi, Pramod S, MD   237 mL at 03/08/24 1348   ondansetron  (ZOFRAN ) injection 4 mg  4 mg Intravenous Q6H PRN Michaela Aisha SQUIBB, MD   4 mg at 03/05/24 2007   rosuvastatin  (CRESTOR ) tablet 20 mg  20 mg Oral Daily Waddell Aquas A, NP   20 mg at 03/08/24 1032   senna-docusate (Senokot-S) tablet 1 tablet  1 tablet Oral QHS PRN Remi Pippin, NP         Discharge Medications: Please see discharge summary for a list of discharge medications.  Relevant Imaging Results:  Relevant Lab Results:   Additional Information SS#: [id number]  Andrez JULIANNA George, RN

## 2024-03-09 ENCOUNTER — Other Ambulatory Visit: Payer: Self-pay

## 2024-03-09 ENCOUNTER — Encounter (HOSPITAL_COMMUNITY): Payer: Self-pay | Admitting: Student in an Organized Health Care Education/Training Program

## 2024-03-09 DIAGNOSIS — Z9181 History of falling: Secondary | ICD-10-CM | POA: Diagnosis not present

## 2024-03-09 DIAGNOSIS — I639 Cerebral infarction, unspecified: Secondary | ICD-10-CM | POA: Diagnosis not present

## 2024-03-09 DIAGNOSIS — E1151 Type 2 diabetes mellitus with diabetic peripheral angiopathy without gangrene: Secondary | ICD-10-CM | POA: Diagnosis not present

## 2024-03-09 DIAGNOSIS — G629 Polyneuropathy, unspecified: Secondary | ICD-10-CM | POA: Diagnosis not present

## 2024-03-09 DIAGNOSIS — I10 Essential (primary) hypertension: Secondary | ICD-10-CM | POA: Diagnosis not present

## 2024-03-09 DIAGNOSIS — Z85038 Personal history of other malignant neoplasm of large intestine: Secondary | ICD-10-CM | POA: Diagnosis not present

## 2024-03-09 DIAGNOSIS — Z7982 Long term (current) use of aspirin: Secondary | ICD-10-CM | POA: Diagnosis not present

## 2024-03-09 DIAGNOSIS — Z7984 Long term (current) use of oral hypoglycemic drugs: Secondary | ICD-10-CM | POA: Diagnosis not present

## 2024-03-09 DIAGNOSIS — E1142 Type 2 diabetes mellitus with diabetic polyneuropathy: Secondary | ICD-10-CM | POA: Diagnosis not present

## 2024-03-09 DIAGNOSIS — G9340 Encephalopathy, unspecified: Secondary | ICD-10-CM | POA: Diagnosis present

## 2024-03-09 DIAGNOSIS — E119 Type 2 diabetes mellitus without complications: Secondary | ICD-10-CM | POA: Diagnosis not present

## 2024-03-09 DIAGNOSIS — R29810 Facial weakness: Secondary | ICD-10-CM | POA: Diagnosis not present

## 2024-03-09 DIAGNOSIS — M545 Low back pain, unspecified: Secondary | ICD-10-CM | POA: Diagnosis not present

## 2024-03-09 DIAGNOSIS — Z8673 Personal history of transient ischemic attack (TIA), and cerebral infarction without residual deficits: Secondary | ICD-10-CM | POA: Diagnosis not present

## 2024-03-09 DIAGNOSIS — G3184 Mild cognitive impairment, so stated: Secondary | ICD-10-CM | POA: Diagnosis not present

## 2024-03-09 DIAGNOSIS — E785 Hyperlipidemia, unspecified: Secondary | ICD-10-CM | POA: Diagnosis not present

## 2024-03-09 DIAGNOSIS — I739 Peripheral vascular disease, unspecified: Secondary | ICD-10-CM | POA: Diagnosis not present

## 2024-03-09 DIAGNOSIS — F05 Delirium due to known physiological condition: Secondary | ICD-10-CM | POA: Diagnosis present

## 2024-03-09 DIAGNOSIS — I6522 Occlusion and stenosis of left carotid artery: Secondary | ICD-10-CM | POA: Diagnosis not present

## 2024-03-09 DIAGNOSIS — R410 Disorientation, unspecified: Secondary | ICD-10-CM | POA: Diagnosis not present

## 2024-03-09 DIAGNOSIS — R2681 Unsteadiness on feet: Secondary | ICD-10-CM | POA: Diagnosis not present

## 2024-03-09 MED ORDER — CLOPIDOGREL BISULFATE 75 MG PO TABS: 75.0000 mg | ORAL_TABLET | Freq: Every day | ORAL | 0 refills | Status: AC

## 2024-03-09 MED ORDER — CLOPIDOGREL BISULFATE 75 MG PO TABS: 75.0000 mg | ORAL_TABLET | Freq: Every day | ORAL | Status: DC

## 2024-03-09 NOTE — Discharge Summary (Addendum)
 Stroke Discharge Summary  Patient ID: Jared [name]   MRN: [id number]      DOB: 1940-08-15  Date of Admission: 03/05/2024 Date of Discharge: 03/09/2024  Attending Physician:  Stroke, Md, MD, Stroke MD Patient's PCP:  Yolande Toribio MATSU, MD  DISCHARGE DIAGNOSIS:  Encephalopathy and Stroke-like episode treated with IV TNK for left facial droop and increased confusion  Principal Problem:   Stroke (cerebrum) (HCC) Active Problems:   At high risk for injury related to fall   Hypertension   Encephalopathy   Acute confusional state   Allergies as of 03/09/2024   No Known Allergies      Medication List     TAKE these medications    aspirin  EC 81 MG tablet Take 1 tablet (81 mg total) by mouth daily. Swallow whole.   clopidogrel  75 MG tablet Commonly known as: PLAVIX  Take 1 tablet (75 mg total) by mouth daily. Start taking on: March 10, 2024   metFORMIN 500 MG 24 hr tablet Commonly known as: GLUCOPHAGE-XR Take 500 mg by mouth daily.   rosuvastatin  20 MG tablet Commonly known as: CRESTOR  Take 1 tablet (20 mg total) by mouth daily.        LABORATORY STUDIES CBC    Component Value Date/Time   WBC 6.9 03/05/2024 1704   RBC 4.61 03/05/2024 1704   HGB 14.6 03/05/2024 1706   HGB 13.8 07/16/2021 1519   HGB 14.8 11/19/2007 1307   HCT 43.0 03/05/2024 1706   HCT 40.4 07/16/2021 1519   HCT 42.7 11/19/2007 1307   PLT 251 03/05/2024 1704   PLT 285 07/16/2021 1519   MCV 92.0 03/05/2024 1704   MCV 91 07/16/2021 1519   MCV 88.8 11/19/2007 1307   MCH 30.4 03/05/2024 1704   MCHC 33.0 03/05/2024 1704   RDW 12.2 03/05/2024 1704   RDW 11.8 07/16/2021 1519   RDW 12.1 11/19/2007 1307   LYMPHSABS 2.0 03/05/2024 1704   LYMPHSABS 2.0 07/16/2021 1519   LYMPHSABS 2.1 11/19/2007 1307   MONOABS 0.8 03/05/2024 1704   MONOABS 0.7 11/19/2007 1307   EOSABS 0.4 03/05/2024 1704   EOSABS 0.2 07/16/2021 1519   BASOSABS 0.1 03/05/2024 1704   BASOSABS 0.0 07/16/2021 1519    BASOSABS 0.0 11/19/2007 1307   CMP    Component Value Date/Time   NA 141 03/05/2024 1706   NA 141 07/16/2021 1519   K 3.7 03/05/2024 1706   CL 102 03/05/2024 1706   CO2 26 03/05/2024 1704   GLUCOSE 132 (H) 03/05/2024 1706   BUN 16 03/05/2024 1706   BUN 20 07/16/2021 1519   CREATININE 0.80 03/05/2024 1706   CALCIUM  8.9 03/05/2024 1704   PROT 6.6 03/05/2024 1704   PROT 7.0 07/16/2021 1519   ALBUMIN 3.6 03/05/2024 1704   ALBUMIN 4.3 07/16/2021 1519   AST 25 03/05/2024 1704   ALT 22 03/05/2024 1704   ALKPHOS 80 03/05/2024 1704   BILITOT 0.6 03/05/2024 1704   BILITOT <0.2 07/16/2021 1519   GFRNONAA >60 03/05/2024 1704   GFRAA 95 02/08/2020 1220   COAGS Lab Results  Component Value Date   INR 1.0 03/05/2024   Lipid Panel    Component Value Date/Time   CHOL 97 03/06/2024 0321   TRIG 49 03/06/2024 0321   HDL 42 03/06/2024 0321   CHOLHDL 2.3 03/06/2024 0321   VLDL 10 03/06/2024 0321   LDLCALC 45 03/06/2024 0321   HgbA1C  Lab Results  Component Value Date   HGBA1C  6.2 (H) 03/05/2024   Urinalysis    Component Value Date/Time   COLORURINE STRAW (A) 03/05/2024 1727   APPEARANCEUR CLEAR 03/05/2024 1727   LABSPEC 1.010 03/05/2024 1727   PHURINE 8.0 03/05/2024 1727   GLUCOSEU NEGATIVE 03/05/2024 1727   HGBUR SMALL (A) 03/05/2024 1727   BILIRUBINUR NEGATIVE 03/05/2024 1727   KETONESUR NEGATIVE 03/05/2024 1727   PROTEINUR NEGATIVE 03/05/2024 1727   UROBILINOGEN 0.2 03/02/2014 1107   NITRITE NEGATIVE 03/05/2024 1727   LEUKOCYTESUR NEGATIVE 03/05/2024 1727   Urine Drug Screen No results found for: LABOPIA, COCAINSCRNUR, LABBENZ, AMPHETMU, THCU, LABBARB  Alcohol  Level    Component Value Date/Time   [facility] <15 03/05/2024 1704     SIGNIFICANT DIAGNOSTIC STUDIES ECHOCARDIOGRAM COMPLETE Result Date: 03/07/2024    ECHOCARDIOGRAM REPORT   Patient Name:   Augustino Savastano. Date of Exam: 03/06/2024 Medical Rec #:  [id number]        Height:       70.0 in Accession #:     [id number]       Weight:       176.4 lb Date of Birth:  [birth year]       BSA:          1.979 m Patient Age:    83 years         BP:           131/79 mmHg Patient Gender: M                HR:           77 bpm. Exam Location:  Inpatient Procedure: 2D Echo (Both Spectral and Color Flow Doppler were utilized during            procedure). Indications:    stroke  History:        Patient has prior history of Echocardiogram examinations, most                 recent 05/16/2023. Risk Factors:Hypertension.  Sonographer:    Tinnie Barefoot RDCS Referring Phys: [id number] DEVON SHAFER IMPRESSIONS  1. Left ventricular ejection fraction, by estimation, is 60 to 65%. The left ventricle has normal function. The left ventricle has no regional wall motion abnormalities. There is mild left ventricular hypertrophy. Left ventricular diastolic parameters are consistent with Grade I diastolic dysfunction (impaired relaxation).  2. Right ventricular systolic function is normal. The right ventricular size is normal.  3. The mitral valve is normal in structure. No evidence of mitral valve regurgitation. No evidence of mitral stenosis.  4. The aortic valve has an indeterminant number of cusps. Aortic valve regurgitation is not visualized. No aortic stenosis is present.  5. Aortic dilatation noted. There is mild dilatation of the ascending aorta, measuring 40 mm.  6. The inferior vena cava is normal in size with greater than 50% respiratory variability, suggesting right atrial pressure of 3 mmHg. Comparison(s): No significant change from prior study. FINDINGS  Left Ventricle: Left ventricular ejection fraction, by estimation, is 60 to 65%. The left ventricle has normal function. The left ventricle has no regional wall motion abnormalities. Strain was performed and the global longitudinal strain is indeterminate. The left ventricular internal cavity size was normal in size. There is mild left ventricular hypertrophy. Left ventricular diastolic  parameters are consistent with Grade I diastolic dysfunction (impaired relaxation). Normal left ventricular filling pressure. Right Ventricle: The right ventricular size is normal. No increase in right ventricular wall thickness. Right ventricular systolic function is normal. Left  Atrium: Left atrial size was normal in size. Right Atrium: Right atrial size was normal in size. Pericardium: There is no evidence of pericardial effusion. Mitral Valve: The mitral valve is normal in structure. No evidence of mitral valve regurgitation. No evidence of mitral valve stenosis. Tricuspid Valve: The tricuspid valve is normal in structure. Tricuspid valve regurgitation is not demonstrated. No evidence of tricuspid stenosis. Aortic Valve: The aortic valve has an indeterminant number of cusps. Aortic valve regurgitation is not visualized. No aortic stenosis is present. Pulmonic Valve: The pulmonic valve was not well visualized. Pulmonic valve regurgitation is not visualized. No evidence of pulmonic stenosis. Aorta: The aortic root is normal in size and structure and aortic dilatation noted. There is mild dilatation of the ascending aorta, measuring 40 mm. Venous: The inferior vena cava is normal in size with greater than 50% respiratory variability, suggesting right atrial pressure of 3 mmHg. IAS/Shunts: No atrial level shunt detected by color flow Doppler. Additional Comments: 3D was performed not requiring image post processing on an independent workstation and was indeterminate.  LEFT VENTRICLE PLAX 2D LVIDd:         3.70 cm   Diastology LVIDs:         2.50 cm   LV e' medial:    5.66 cm/s LV PW:         1.10 cm   LV E/e' medial:  8.5 LV IVS:        1.20 cm   LV e' lateral:   7.94 cm/s LVOT diam:     2.30 cm   LV E/e' lateral: 6.0 LV SV:         69 LV SV Index:   35 LVOT Area:     4.15 cm LV IVRT:       67 msec  RIGHT VENTRICLE             IVC RV S prime:     11.70 cm/s  IVC diam: 1.50 cm TAPSE (M-mode): 1.3 cm LEFT ATRIUM              Index        RIGHT ATRIUM           Index LA diam:        3.40 cm 1.72 cm/m   RA Area:     11.10 cm LA Vol (A2C):   38.1 ml 19.26 ml/m  RA Volume:   19.90 ml  10.06 ml/m LA Vol (A4C):   28.4 ml 14.35 ml/m LA Biplane Vol: 32.6 ml 16.48 ml/m  AORTIC VALVE LVOT Vmax:   85.00 cm/s LVOT Vmean:  58.700 cm/s LVOT VTI:    0.165 m  AORTA Ao Root diam: 3.60 cm Ao Asc diam:  4.00 cm MITRAL VALVE MV Area (PHT): 4.06 cm    SHUNTS MV Decel Time: 187 msec    Systemic VTI:  0.16 m MV E velocity: 48.00 cm/s  Systemic Diam: 2.30 cm MV A velocity: 75.80 cm/s MV E/A ratio:  0.63 Vishnu Priya Mallipeddi Electronically signed by Diannah Late Mallipeddi Signature Date/Time: 03/07/2024/10:49:35 AM    Final    MR BRAIN WO CONTRAST Result Date: 03/06/2024 EXAM: MRI BRAIN WITHOUT CONTRAST 03/06/2024 04:58:21 PM TECHNIQUE: Multiplanar multisequence MRI of the head/brain was performed without the administration of intravenous contrast. COMPARISON: 05/15/2023 CLINICAL HISTORY: Neuro deficit, acute, stroke suspected. FINDINGS: BRAIN AND VENTRICLES: No acute infarct. No intracranial hemorrhage. No mass. No midline shift. Ex vacuo dilatation of the lateral ventricles. Generalized volume loss.  Early confluent hyperintense T2-weighted signal within the cerebral white matter, most commonly due to chronic small vessel disease. Old small left cerebellar infarct. The sella is unremarkable. Normal flow voids. ORBITS: Ocular lens replacements. SINUSES AND MASTOIDS: Bilateral mastoid effusions. Nasopharynx is clear. BONES AND SOFT TISSUES: Normal marrow signal. No acute soft tissue abnormality. IMPRESSION: 1. No acute intracranial abnormality. 2. Early confluent T2 hyperintense cerebral white matter signal, most consistent with chronic small vessel disease. 3. Old small left cerebellar infarct. 4. Generalized volume loss with ex vacuo dilatation of the lateral ventricles. Electronically signed by: Franky Stanford MD 03/06/2024 05:10 PM EDT RP  Workstation: [id number]   EEG adult Result Date: 03/06/2024 Shelton Arlin KIDD, MD     03/06/2024 11:30 AM Patient Name: [name] MRN: [id number] Epilepsy Attending: Arlin KIDD Shelton Referring Physician/Provider: Waddell Karna LABOR, NP Date: 03/06/2024 Duration: 27.45 mins Patient history: 83yo m with ams. EEG to evaluate for seizure Level of alertness: Awake AEDs during EEG study: None Technical aspects: This EEG study was done with scalp electrodes positioned according to the 10-20 International system of electrode placement. Electrical activity was reviewed with band pass filter of 1-70Hz , sensitivity of 7 uV/mm, display speed of 49mm/sec with a 60Hz  notched filter applied as appropriate. EEG data were recorded continuously and digitally stored.  Video monitoring was available and reviewed as appropriate. Description: The posterior dominant rhythm consists of 8 Hz activity of moderate voltage (25-35 uV) seen predominantly in posterior head regions, symmetric and reactive to eye opening and eye closing. EEG showed continuous 3 to 6 Hz theta-delta slowing in left hemisphere. Intermittent generalized 3-6hz  theta-delta slowing was also noted. Hyperventilation and photic stimulation were not performed.   ABNORMALITY - Intermittent slow, generalized - Continuous slow, left hemisphere IMPRESSION: This study is suggestive of cortical dysfunction arising from  left hemisphere likely secondary to underlying structural abnormality. Additionally there is generalized cerebral dysfunction (encephalopathy). No seizures or epileptiform discharges were seen throughout the recording. Priyanka KIDD Shelton   CT HEAD WO CONTRAST ( ) Result Date: 03/05/2024 CLINICAL DATA:  Code stroke. Initial evaluation for acute neuro deficit, stroke suspected. EXAM: CT HEAD WITHOUT CONTRAST TECHNIQUE: Contiguous axial images were obtained from the base of the skull through the vertex without intravenous contrast. RADIATION DOSE REDUCTION:  This exam was performed according to the departmental dose-optimization program which includes automated exposure control, adjustment of the mA and/or kV according to patient size and/or use of iterative reconstruction technique. COMPARISON:  Comparison made with CTs from earlier the same day. FINDINGS: Brain: Atrophy with advanced chronic microvascular ischemic disease, stable. No acute intracranial hemorrhage. No acute large vessel territory infarct. No mass lesion or midline shift. Ventricular prominence related global parenchymal volume loss of hydrocephalus, stable. No extra-axial fluid collection. Vascular: Some IV contrast material remains on board from prior CTA. Calcified atherosclerosis present at skull base. Skull: Scalp soft tissues within normal limits.  Calvarium intact. Sinuses/Orbits: Left gaze preference noted. Paranasal sinuses remain largely clear. Small right mastoid effusion. Other: None. ASPECTS [facility] Stroke Program Early CT Score) - Ganglionic level infarction (caudate, lentiform nuclei, internal capsule, insula, M1-M3 cortex): 7 - Supraganglionic infarction (M4-M6 cortex): 3 Total score (0-10 with 10 being normal): 10 IMPRESSION: 1. Stable head CT. No acute intracranial abnormality. 2. Aspects is 10. 3. Atrophy with advanced chronic microvascular ischemic disease. These results were communicated to Dr. Michaela at 8:49 pm on 03/05/2024 by text page via the [facility] messaging system. Electronically Signed   By: Morene Hoard  M.D.   On: 03/05/2024 20:49   CT ANGIO HEAD NECK W WO CM (CODE STROKE) Result Date: 03/05/2024 EXAM: CTA Head and Neck with Intravenous Contrast. CT Head without Contrast. CLINICAL HISTORY: Neuro deficit, acute, stroke suspected. TECHNIQUE: Axial CTA images of the head and neck performed with and without intravenous contrast. MIP reconstructed images were created and reviewed. Axial computed tomography images of the head/brain performed without intravenous  contrast. Note: Per PQRS, the description of internal carotid artery percent stenosis, including 0 percent or normal exam, is based on [place] American Symptomatic Carotid Endarterectomy Trial (NASCET) criteria. Dose reduction technique was used including one or more of the following: automated exposure control, adjustment of mA and kV according to patient size, and/or iterative reconstruction. CONTRAST: Without and with; 75 mL iohexol  (OMNIPAQUE ) 350 MG/ML injection. COMPARISON: None provided. FINDINGS: CT HEAD: BRAIN: No acute intraparenchymal hemorrhage. No mass lesion. No CT evidence for acute territorial infarct. No midline shift or extra-axial collection. VENTRICLES: No hydrocephalus. ORBITS: The orbits are unremarkable. SINUSES AND MASTOIDS: The paranasal sinuses and mastoid air cells are clear. CTA NECK: COMMON CAROTID ARTERIES: Mild atherosclerotic calcification of the right common carotid artery without hemodynamically significant stenosis. Mixed density atherosclerotic disease at the left carotid bifurcation extending into the left ICA resulting in 50% stenosis. The distal left ICA is widely patent. No dissection or occlusion. INTERNAL CAROTID ARTERIES: Mild atherosclerotic calcification of the right internal carotid artery without hemodynamically significant stenosis. Mixed density atherosclerotic disease at the left carotid bifurcation extending into the left ICA resulting in 50% stenosis. The distal left ICA is widely patent. No dissection or occlusion. VERTEBRAL ARTERIES: The vertebral arteries are right dominant. The left vertebral artery arises directly from the aortic arch. There is atherosclerotic calcification at the right vertebral artery origin without significant stenosis. No dissection or occlusion. CTA HEAD: ANTERIOR CEREBRAL ARTERIES: No significant stenosis. No occlusion. No aneurysm. MIDDLE CEREBRAL ARTERIES: No significant stenosis. No occlusion. No aneurysm. POSTERIOR CEREBRAL ARTERIES:  No significant stenosis. No occlusion. No aneurysm. BASILAR ARTERY: No significant stenosis. No occlusion. No aneurysm. OTHER: SOFT TISSUES: No acute finding. No masses or lymphadenopathy. BONES: No acute osseous abnormality. IMPRESSION: 1. No emergent large vessel occlusion. 2. Mixed density atherosclerotic disease at the left carotid bifurcation extending into the left ICA resulting in 50% stenosis; the distal ICA is widely patent. 3. Mild atherosclerotic calcification of the right common and internal carotid arteries without hemodynamically significant stenosis. 4. Atherosclerotic calcification at the right vertebral artery origin without significant stenosis. Electronically signed by: Franky Stanford MD 03/05/2024 06:01 PM EDT RP Workstation: [id number]   DG CHEST PORT 1 VIEW Result Date: 03/05/2024 EXAM: 1 VIEW(S) XRAY OF THE CHEST 03/05/2024 05:45:00 PM COMPARISON: 03/11/2024 CLINICAL HISTORY: [id number] Altered mental status [id number] Altered mental status FINDINGS: LINES, TUBES AND DEVICES: Loop recorder projects over the left chest unchanged. LUNGS AND PLEURA: Low lung volumes. Bibasilar airspace opacities, left greater than right, favor atelectasis or scarring similar to prior study. No pulmonary edema. No pleural effusion. No pneumothorax. HEART AND MEDIASTINUM: No acute abnormality of the cardiac and mediastinal silhouettes. BONES AND SOFT TISSUES: Prior left shoulder replacement. IMPRESSION: 1. Low lung volumes. 2. Bibasilar airspace opacities, left greater than right, favored to represent atelectasis or scarring, similar to the prior study. Electronically signed by: Franky Crease MD 03/05/2024 05:49 PM EDT RP Workstation: [id number]   CT HEAD CODE STROKE WO CONTRAST Result Date: 03/05/2024 EXAM: CT HEAD WITHOUT CONTRAST 03/05/2024 05:09:09 PM TECHNIQUE: CT of the head was performed  without the administration of intravenous contrast. Automated exposure control, iterative reconstruction, and/or weight based  adjustment of the mA/kV was utilized to reduce the radiation dose to as low as reasonably achievable. COMPARISON: 05/15/2023 CLINICAL HISTORY: Neuro deficit, acute, stroke suspected. FINDINGS: BRAIN AND VENTRICLES: No acute hemorrhage. No evidence of acute infarct. Generalized volume loss with diffuse chronic ischemic white matter changes. No hydrocephalus. No extra-axial collection. No mass effect or midline shift. [place] Stroke Program Early CT Score (ASPECTS) Ganglionic (Caudate, IC, Lentiform Nucleus, Insula, M1-M3): 7 Supraganglionic (M4-M6): 3 Total: 10 ORBITS: No acute abnormality. SINUSES: No acute abnormality. SOFT TISSUES AND SKULL: No acute soft tissue abnormality. No skull fracture. Bilateral small volume mastoid fluid. IMPRESSION: 1. No acute intracranial abnormality. 2. Generalized volume loss with diffuse chronic ischemic white matter changes. 3. Bilateral small volume mastoid fluid. 4. ASPECTS: 10. Findings communicated to Dr. Eligio Lav at 5:13 pm on 03/05/2024. Electronically signed by: Franky Stanford MD 03/05/2024 05:13 PM EDT RP Workstation: [id number]   CUP PACEART REMOTE DEVICE CHECK Result Date: 02/18/2024 ILR summary report received. Battery status OK. Normal device function. No new symptom, tachy, brady, or pause episodes. No new AF episodes. Monthly summary reports and ROV/PRN LA, CVRS     HISTORY OF PRESENT ILLNESS 83 y.o. male with history of stroke, HTN, neuropathy, left carotid stenosis, gait dysfunction with wheelchair use, diabetes, history of colon cancer presenting with an acute onset of altered mental status   NIH on Admission 8    HOSPITAL COURSE Strokelike episode treated with IV TNK for left facial droop and increased confusion Code Stroke  CT head No acute abnormality.  Small vessel disease. Atrophy.  ASPECTS 10.     CTA head & neck No LVO  Repeat CT head stable  MRI:  No acute intracranial abnormality Early T2 hyperintense cerebral white matter, consistent  with chronic small vessel disease Old small left cerebellar infarct 2D Echo EF 60 to 65%.  Mild LVH.  Left atrial size normal. Loop interrogated and negative for any arrhythmia Routine EEG This study is suggestive of cortical dysfunction arising from  left hemisphere likely secondary to underlying structural abnormality. Additionally there is generalized cerebral dysfunction (encephalopathy). No seizures or epileptiform discharges were seen throughout the recording.  UA negative LDL 45 HgbA1c 6.2 VTE prophylaxis -SCDs aspirin  81 mg daily prior to admission, continue aspirin  and Plavix  x 3 weeks and then back to aspirin  alone due to no new stroke seen.  Therapy recommendations: SNF Disposition: SNF 11/4  Hypertension Home meds: None Stable Blood Pressure Goal: Normotensive   Hyperlipidemia Home meds: Crestor  20 mg,  resumed in hospital LDL 45, goal < 70 Continue statin at discharge   Diabetes type II Controlled Home meds: Metformin HgbA1c 6.2, goal < 7.0 CBGs SSI Recommend close follow-up with PCP  Dysphagia Patient has post-stroke dysphagia, SLP consulted       Diet    DIET DYS 3 Room service appropriate? Yes with Assist; Fluid consistency: Thin        Advance diet as tolerated   DISCHARGE EXAM Blood pressure 112/76, pulse 78, temperature 98.6 F (37 C), resp. rate 18, height 5' 10 (1.778 m), weight 80 kg, SpO2 96%. PHYSICAL EXAM General:  Alert, well-nourished, well-developed elderly Caucasian male in no acute distress CV: Regular rate and rhythm on monitor Respiratory:  Regular, unlabored respirations on room air   NEURO:  Mental Status: He is awake and alert, oriented to place and person but not to time.  He can  speak in simple sentences and repeat simple sentences and follow commands.  Disoriented to place and person and time.  Follows simple commands.   Cranial Nerves:  II: PERRL. Visual fields full.  III, IV, VI: EOMI. V: Sensation is intact to light  touch and symmetrical to face.  VII: Face is symmetrical resting and smiling VIII: hearing intact to voice. IX, X: Palate elevates symmetrically. Phonation is normal.  KP:Dynloizm shrug 5/5. XII: tongue is midline without fasciculations. Motor: 5/5 strength to all muscle groups tested.  Tone: is normal and bulk is normal Sensation- Intact to light touch bilaterally.  Coordination: FTN intact bilaterally, HKS: no ataxia in BLE. Gait- deferred  Discharge Diet       Diet   DIET DYS 3 Room service appropriate? Yes with Assist; Fluid consistency: Thin   liquids  DISCHARGE PLAN Disposition:  SNF aspirin  81 mg daily and clopidogrel  75 mg daily for secondary stroke prevention for 3 weeks then aspirin  alone. Ongoing stroke risk factor control by Primary Care Physician at time of discharge Follow-up PCP Yolande Toribio MATSU, MD in 2 weeks. Follow-up in [facility] in 8 weeks, office to schedule an appointment.   35 minutes were spent preparing discharge.  Rocky JAYSON Likes, DNP [facility]  I have personally obtained history,examined this patient, reviewed notes, independently viewed imaging studies, participated in medical decision making and plan of care.ROS completed by me personally and pertinent positives fully documented  I have made any additions or clarifications directly to the above note. Agree with note above.    Eather Popp, MD Medical Director [facility] Pager: [phone] 03/09/2024 2:30 PM

## 2024-03-09 NOTE — TOC Transition Note (Signed)
 Transition of Care [facility]) - Discharge Note   Patient Details  Name: Jared [name]. MRN: [id number] Date of Birth: 1940-12-19  Transition of Care [facility]) CM/SW Contact:  Andrez JULIANNA George, RN Phone Number: 03/09/2024, 8:27 AM   Clinical Narrative:    Jared [name] is approved 11/4 - 11/6 Jared [name] PI#[id number]  Pt is discharging to [facility] today. He is transporting via [facility]. Wife has been updated and bedside RN aware.   Number for report: [phone]   Final next level of care: Skilled Nursing Facility Barriers to Discharge: No Barriers Identified   Patient Goals and CMS Choice   CMS Medicare.gov Compare Post Acute Care list provided to:: Patient Represenative (must comment) Choice offered to / list presented to : Spouse      Discharge Placement PASRR number recieved: 03/08/24            Patient chooses bed at: [facility] [place] Patient to be transferred to facility by: [facility] Name of family member notified: spouse Patient and family notified of of transfer: 03/09/24  Discharge Plan and Services Additional resources added to the After Visit Summary for   In-house Referral: Clinical Social Work   Post Acute Care Choice: Skilled Nursing Facility                               Social Drivers of Health (SDOH) Interventions SDOH Screenings   Food Insecurity: No Food Insecurity (05/16/2023)  Housing: Low Risk  (05/16/2023)  Transportation Needs: No Transportation Needs (05/16/2023)  Utilities: Not At Risk (05/16/2023)  Depression (PHQ2-9): Low Risk  (07/22/2023)  Social Connections: Moderately Integrated (05/16/2023)  Tobacco Use: Low Risk  (07/22/2023)     Readmission Risk Interventions     No data to display

## 2024-03-09 NOTE — Progress Notes (Signed)
 Speech Language Pathology Treatment: Dysphagia;Cognitive-Linguistic  Patient Details Name: Jared [name]. MRN: [id number] DOB: 09-21-1940 Today's Date: 03/09/2024 Time: 8667-8651 SLP Time Calculation (min) (ACUTE ONLY): 16 min  Assessment / Plan / Recommendation Clinical Impression  Cognitive-Linguistic: Pt demonstrates improved sustained attention to functional tasks today. He is making his needs known by using short phrases. He completed both familiar and novel tasks given Min cueing. Pt states his language has not returned to baseline. He continues to exhibit deficits related to initiation and expressive language with frequent paraphasias at the sentence level. Ongoing SLP f/u at his next venue of care may be beneficial as this appears to be an acute change.   Swallowing: Pt is much more attentive to POs, masticating regular solids efficiently. No s/s of aspiration were observed with sequential straw sips of thin liquids. Recommend advancing solids to regular texture and continuing thin liquids.    HPI HPI: 83 yo male with history of stroke, neuropathy, L carotid stenosis, gait dysfunction with wheelchair use, DM, history of colon cancer who is presenting with acute onset of AMS. S/p TNK 10/31. CTH negative, MRI 11/1 without acute abnormality. CXR with stable bibasilar (L>R) atelectasis. Pt followed for cognition during admission in January 2025 for acute/early subacute L parietal and R frontal/occipital stroke.      SLP Plan  Continue with current plan of care          Recommendations  Diet recommendations: Thin liquid;Regular Liquids provided via: Cup;Straw Medication Administration: Whole meds with puree Supervision: Staff to assist with self feeding;Full supervision/cueing for compensatory strategies Compensations: Minimize environmental distractions;Slow rate;Small sips/bites Postural Changes and/or Swallow Maneuvers: Seated upright 90 degrees                  Oral  care BID   Frequent or constant Supervision/Assistance Dysphagia, unspecified (R13.10)     Continue with current plan of care     Damien Blumenthal, M.A., CCC-SLP Speech Language Pathology, Acute Rehabilitation Services  Secure Chat preferred [phone]   03/09/2024, 2:00 PM

## 2024-03-09 NOTE — Plan of Care (Signed)
  Problem: Coping: Goal: Will identify appropriate support needs Outcome: Progressing

## 2024-03-14 DIAGNOSIS — I739 Peripheral vascular disease, unspecified: Secondary | ICD-10-CM | POA: Diagnosis not present

## 2024-03-14 DIAGNOSIS — Z8673 Personal history of transient ischemic attack (TIA), and cerebral infarction without residual deficits: Secondary | ICD-10-CM | POA: Diagnosis not present

## 2024-03-14 DIAGNOSIS — R2681 Unsteadiness on feet: Secondary | ICD-10-CM | POA: Diagnosis not present

## 2024-03-14 DIAGNOSIS — I10 Essential (primary) hypertension: Secondary | ICD-10-CM | POA: Diagnosis not present

## 2024-03-14 DIAGNOSIS — E785 Hyperlipidemia, unspecified: Secondary | ICD-10-CM | POA: Diagnosis not present

## 2024-03-14 DIAGNOSIS — E1151 Type 2 diabetes mellitus with diabetic peripheral angiopathy without gangrene: Secondary | ICD-10-CM | POA: Diagnosis not present

## 2024-03-15 ENCOUNTER — Encounter

## 2024-03-19 ENCOUNTER — Ambulatory Visit: Attending: Internal Medicine

## 2024-03-21 DIAGNOSIS — E1142 Type 2 diabetes mellitus with diabetic polyneuropathy: Secondary | ICD-10-CM | POA: Diagnosis not present

## 2024-03-21 DIAGNOSIS — Z9181 History of falling: Secondary | ICD-10-CM | POA: Diagnosis not present

## 2024-03-21 DIAGNOSIS — M545 Low back pain, unspecified: Secondary | ICD-10-CM | POA: Diagnosis not present

## 2024-03-21 DIAGNOSIS — G629 Polyneuropathy, unspecified: Secondary | ICD-10-CM | POA: Diagnosis not present

## 2024-03-26 ENCOUNTER — Ambulatory Visit: Attending: Cardiology

## 2024-03-26 DIAGNOSIS — I639 Cerebral infarction, unspecified: Secondary | ICD-10-CM

## 2024-03-29 ENCOUNTER — Ambulatory Visit: Payer: Self-pay | Admitting: Cardiology

## 2024-03-29 LAB — CUP PACEART REMOTE DEVICE CHECK
Date Time Interrogation Session: 20251121131100
Implantable Pulse Generator Implant Date: 20250113
Pulse Gen Serial Number: 127022

## 2024-03-30 NOTE — Progress Notes (Signed)
 Remote Loop Recorder Transmission

## 2024-04-15 ENCOUNTER — Encounter

## 2024-04-19 ENCOUNTER — Ambulatory Visit

## 2024-04-26 ENCOUNTER — Ambulatory Visit

## 2024-04-26 DIAGNOSIS — I639 Cerebral infarction, unspecified: Secondary | ICD-10-CM | POA: Diagnosis not present

## 2024-04-27 LAB — CUP PACEART REMOTE DEVICE CHECK
Date Time Interrogation Session: 20251222221500
Implantable Pulse Generator Implant Date: 20250113
Pulse Gen Serial Number: 127022

## 2024-04-28 NOTE — Progress Notes (Signed)
 Remote Loop Recorder Transmission

## 2024-05-02 ENCOUNTER — Ambulatory Visit: Payer: Self-pay | Admitting: Cardiology

## 2024-05-17 ENCOUNTER — Encounter

## 2024-05-20 ENCOUNTER — Ambulatory Visit

## 2024-05-27 ENCOUNTER — Ambulatory Visit: Attending: Cardiology

## 2024-05-27 DIAGNOSIS — I639 Cerebral infarction, unspecified: Secondary | ICD-10-CM | POA: Diagnosis not present

## 2024-05-31 ENCOUNTER — Ambulatory Visit: Payer: Self-pay | Admitting: Cardiology

## 2024-05-31 LAB — CUP PACEART REMOTE DEVICE CHECK
Date Time Interrogation Session: 20260123121700
Implantable Pulse Generator Implant Date: 20250113
Pulse Gen Serial Number: 127022

## 2024-05-31 NOTE — Progress Notes (Signed)
 Remote Loop Recorder Transmission

## 2024-06-17 ENCOUNTER — Encounter

## 2024-06-20 ENCOUNTER — Ambulatory Visit

## 2024-06-27 ENCOUNTER — Ambulatory Visit

## 2024-07-28 ENCOUNTER — Ambulatory Visit

## 2024-08-28 ENCOUNTER — Ambulatory Visit

## 2024-09-28 ENCOUNTER — Ambulatory Visit

## 2024-10-29 ENCOUNTER — Ambulatory Visit
